# Patient Record
Sex: Male | Born: 1935 | Race: White | Hispanic: No | Marital: Married | Smoking: Former smoker
Health system: Southern US, Community
[De-identification: ages and names within clinical notes are randomized; demographics above are authoritative.]

## PROBLEM LIST (undated history)

## (undated) DIAGNOSIS — R000 Tachycardia, unspecified: Secondary | ICD-10-CM

## (undated) DIAGNOSIS — I43 Cardiomyopathy in diseases classified elsewhere: Secondary | ICD-10-CM

## (undated) DIAGNOSIS — T300 Burn of unspecified body region, unspecified degree: Secondary | ICD-10-CM

## (undated) DIAGNOSIS — N289 Disorder of kidney and ureter, unspecified: Secondary | ICD-10-CM

## (undated) DIAGNOSIS — I10 Essential (primary) hypertension: Secondary | ICD-10-CM

## (undated) DIAGNOSIS — E785 Hyperlipidemia, unspecified: Secondary | ICD-10-CM

## (undated) DIAGNOSIS — I509 Heart failure, unspecified: Secondary | ICD-10-CM

## (undated) DIAGNOSIS — I4891 Unspecified atrial fibrillation: Secondary | ICD-10-CM

## (undated) DIAGNOSIS — K449 Diaphragmatic hernia without obstruction or gangrene: Secondary | ICD-10-CM

## (undated) DIAGNOSIS — M1990 Unspecified osteoarthritis, unspecified site: Secondary | ICD-10-CM

## (undated) HISTORY — DX: Tachycardia, unspecified: I43

## (undated) HISTORY — DX: Essential (primary) hypertension: I10

## (undated) HISTORY — PX: BACK SURGERY: SHX140

## (undated) HISTORY — DX: Heart failure, unspecified: I50.9

## (undated) HISTORY — DX: Diaphragmatic hernia without obstruction or gangrene: K44.9

## (undated) HISTORY — DX: Hyperlipidemia, unspecified: E78.5

## (undated) HISTORY — DX: Burn of unspecified body region, unspecified degree: T30.0

## (undated) HISTORY — DX: Unspecified atrial fibrillation: I48.91

## (undated) HISTORY — DX: Tachycardia, unspecified: R00.0

---

## 2004-04-13 ENCOUNTER — Ambulatory Visit: Payer: Self-pay | Admitting: Family Medicine

## 2004-04-21 ENCOUNTER — Ambulatory Visit: Payer: Self-pay | Admitting: Family Medicine

## 2004-06-02 ENCOUNTER — Ambulatory Visit: Payer: Self-pay | Admitting: Family Medicine

## 2004-06-05 ENCOUNTER — Ambulatory Visit: Payer: Self-pay | Admitting: Family Medicine

## 2004-06-15 ENCOUNTER — Ambulatory Visit: Payer: Self-pay | Admitting: Family Medicine

## 2004-06-19 ENCOUNTER — Ambulatory Visit: Payer: Self-pay | Admitting: Family Medicine

## 2004-06-29 ENCOUNTER — Ambulatory Visit: Payer: Self-pay | Admitting: Family Medicine

## 2004-07-06 ENCOUNTER — Ambulatory Visit: Payer: Self-pay | Admitting: Family Medicine

## 2004-08-03 ENCOUNTER — Ambulatory Visit: Payer: Self-pay | Admitting: Family Medicine

## 2004-08-19 ENCOUNTER — Ambulatory Visit: Payer: Self-pay | Admitting: Family Medicine

## 2004-08-28 ENCOUNTER — Ambulatory Visit: Payer: Self-pay | Admitting: Family Medicine

## 2004-09-03 ENCOUNTER — Ambulatory Visit: Payer: Self-pay | Admitting: Family Medicine

## 2004-10-01 ENCOUNTER — Ambulatory Visit: Payer: Self-pay | Admitting: Family Medicine

## 2004-10-14 ENCOUNTER — Ambulatory Visit: Payer: Self-pay | Admitting: Family Medicine

## 2004-10-27 ENCOUNTER — Ambulatory Visit: Payer: Self-pay | Admitting: Family Medicine

## 2004-11-09 ENCOUNTER — Ambulatory Visit: Payer: Self-pay | Admitting: Family Medicine

## 2004-11-23 ENCOUNTER — Ambulatory Visit: Payer: Self-pay | Admitting: Family Medicine

## 2004-12-23 ENCOUNTER — Ambulatory Visit: Payer: Self-pay | Admitting: Family Medicine

## 2004-12-31 ENCOUNTER — Ambulatory Visit: Payer: Self-pay | Admitting: Family Medicine

## 2005-01-15 ENCOUNTER — Ambulatory Visit: Payer: Self-pay | Admitting: Family Medicine

## 2005-01-28 ENCOUNTER — Ambulatory Visit: Payer: Self-pay | Admitting: Family Medicine

## 2005-02-19 ENCOUNTER — Ambulatory Visit: Payer: Self-pay | Admitting: Family Medicine

## 2005-02-22 ENCOUNTER — Ambulatory Visit: Payer: Self-pay | Admitting: Family Medicine

## 2005-03-05 ENCOUNTER — Ambulatory Visit: Payer: Self-pay | Admitting: Family Medicine

## 2005-03-11 ENCOUNTER — Ambulatory Visit: Payer: Self-pay | Admitting: Family Medicine

## 2005-04-13 ENCOUNTER — Ambulatory Visit: Payer: Self-pay | Admitting: Family Medicine

## 2005-05-13 ENCOUNTER — Ambulatory Visit: Payer: Self-pay | Admitting: Family Medicine

## 2005-06-29 ENCOUNTER — Ambulatory Visit: Payer: Self-pay | Admitting: Family Medicine

## 2005-07-07 ENCOUNTER — Ambulatory Visit: Payer: Self-pay | Admitting: Family Medicine

## 2005-07-13 ENCOUNTER — Ambulatory Visit: Payer: Self-pay | Admitting: Family Medicine

## 2005-07-15 ENCOUNTER — Ambulatory Visit: Payer: Self-pay | Admitting: Family Medicine

## 2005-08-12 ENCOUNTER — Ambulatory Visit: Payer: Self-pay | Admitting: Family Medicine

## 2005-08-16 ENCOUNTER — Ambulatory Visit: Payer: Self-pay | Admitting: Family Medicine

## 2010-08-26 ENCOUNTER — Encounter: Payer: Self-pay | Admitting: Cardiovascular Disease

## 2010-08-31 ENCOUNTER — Encounter: Payer: Self-pay | Admitting: Cardiovascular Disease

## 2010-08-31 ENCOUNTER — Ambulatory Visit (INDEPENDENT_AMBULATORY_CARE_PROVIDER_SITE_OTHER): Payer: Medicare Other | Admitting: Cardiovascular Disease

## 2010-08-31 DIAGNOSIS — I10 Essential (primary) hypertension: Secondary | ICD-10-CM

## 2010-08-31 DIAGNOSIS — I739 Peripheral vascular disease, unspecified: Secondary | ICD-10-CM | POA: Insufficient documentation

## 2010-08-31 DIAGNOSIS — I5032 Chronic diastolic (congestive) heart failure: Secondary | ICD-10-CM | POA: Insufficient documentation

## 2010-08-31 DIAGNOSIS — I509 Heart failure, unspecified: Secondary | ICD-10-CM

## 2010-08-31 DIAGNOSIS — I4891 Unspecified atrial fibrillation: Secondary | ICD-10-CM

## 2010-08-31 NOTE — Assessment & Plan Note (Addendum)
His most recent EF was normal. He is well compensated at this time and seems to be in NYHA class 2. He is on good medical therapy.
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
## 2010-08-31 NOTE — Assessment & Plan Note (Signed)
BP is reasonable controlled. Will continue with current treatment.

## 2010-08-31 NOTE — Assessment & Plan Note (Signed)
He reports atypical leg discomfort. His pedal pulses are mildly reduced with prolonged history of diabetes. Will obtain an ABI.

## 2010-08-31 NOTE — Assessment & Plan Note (Addendum)
Chronic. Rate is controlled with Coreg and Digoxin which will be continued. Continue long term anticoagulation with goal INR of 2-3 which is being managed by Dr. Unk Lightning. He is currently taking 11 mg of Warfarin.  He is being scheduled for prostate biopsy. Given that he is on a large dose, I recommend holding Warfarin 1 week before planned procedure and checking PT/INR 1 day before. No need to bridge with Lovenox given that his short term thromboembolic risk is overall low. Warfarin can be resumed on day of or 1 day after the procedure.

## 2010-09-02 ENCOUNTER — Encounter: Payer: Self-pay | Admitting: Cardiovascular Disease

## 2010-09-03 ENCOUNTER — Encounter: Payer: Self-pay | Admitting: Cardiovascular Disease

## 2010-09-03 ENCOUNTER — Telehealth: Payer: Self-pay | Admitting: *Deleted

## 2010-09-03 NOTE — Telephone Encounter (Signed)
Called patient again after he called back to explain to him that ABI was normal.  Voiced understanding.

## 2010-09-03 NOTE — Telephone Encounter (Signed)
Opened

## 2010-11-26 ENCOUNTER — Ambulatory Visit (INDEPENDENT_AMBULATORY_CARE_PROVIDER_SITE_OTHER): Payer: Medicare Other | Admitting: Cardiovascular Disease

## 2010-11-26 ENCOUNTER — Encounter: Payer: Self-pay | Admitting: Cardiovascular Disease

## 2010-11-26 DIAGNOSIS — I10 Essential (primary) hypertension: Secondary | ICD-10-CM

## 2010-11-26 DIAGNOSIS — I4891 Unspecified atrial fibrillation: Secondary | ICD-10-CM

## 2010-11-26 DIAGNOSIS — I509 Heart failure, unspecified: Secondary | ICD-10-CM

## 2010-11-26 NOTE — Assessment & Plan Note (Signed)
He is stable and currently New York Heart Association class II. Most recent ejection fraction was normal. Continue medical therapy. There is no evidence of fluid overload.
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
## 2010-11-26 NOTE — Assessment & Plan Note (Signed)
His blood pressure is well controlled on current medications. 

## 2010-11-26 NOTE — Assessment & Plan Note (Signed)
His ventricular rate is well controlled with carvedilol and digoxin. Both will be continued. Continue long-term anticoagulation with warfarin with a goal I now between 2 and 3.

## 2011-03-02 ENCOUNTER — Ambulatory Visit
Admission: RE | Admit: 2011-03-02 | Discharge: 2011-03-02 | Disposition: A | Discharge status: Home / Self Care | Payer: Medicare Other | Source: Ambulatory Visit | Attending: Radiation Oncology | Admitting: Radiation Oncology

## 2011-03-02 DIAGNOSIS — C61 Malignant neoplasm of prostate: Secondary | ICD-10-CM | POA: Insufficient documentation

## 2011-03-02 DIAGNOSIS — Z7901 Long term (current) use of anticoagulants: Secondary | ICD-10-CM | POA: Insufficient documentation

## 2011-03-02 DIAGNOSIS — I4891 Unspecified atrial fibrillation: Secondary | ICD-10-CM | POA: Insufficient documentation

## 2011-03-02 DIAGNOSIS — E119 Type 2 diabetes mellitus without complications: Secondary | ICD-10-CM | POA: Insufficient documentation

## 2011-03-02 DIAGNOSIS — Z79899 Other long term (current) drug therapy: Secondary | ICD-10-CM | POA: Insufficient documentation

## 2011-03-02 DIAGNOSIS — E7800 Pure hypercholesterolemia, unspecified: Secondary | ICD-10-CM | POA: Insufficient documentation

## 2011-05-11 ENCOUNTER — Encounter: Payer: Self-pay | Admitting: Radiation Oncology

## 2011-05-11 ENCOUNTER — Ambulatory Visit
Admission: RE | Admit: 2011-05-11 | Discharge: 2011-05-11 | Disposition: A | Discharge status: Home / Self Care | Payer: Medicare Other | Source: Ambulatory Visit | Attending: Radiation Oncology | Admitting: Radiation Oncology

## 2011-05-11 VITALS — BP 119/78 | HR 71 | Temp 97.3°F | Resp 18 | Wt 281.8 lb

## 2011-05-11 DIAGNOSIS — C61 Malignant neoplasm of prostate: Secondary | ICD-10-CM

## 2011-05-11 NOTE — Progress Notes (Signed)
C/O HOT FLASHES FROM HORMONE INJECTIONS.  NORMAL URINATION

## 2011-05-11 NOTE — Progress Notes (Signed)
A followup note:  [name] returns today for review and scheduling of his radiation therapy. He was started on androgen deprivation therapy approximately 2 months ago. He is without new GU or GI difficulties. His I PSS score is 10. To review, he has a Gleason 8 (4+4) adenocarcinoma prostate with a presenting PSA of 7.4. When I saw him in consultation on 03/02/2011 he was interested in 5 weeks of external beam radiation therapy followed by prostate seed implant boost.  His physical examination not performed.  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 I again discussed his radiation therapy options. He and his daughter are more in favor of 8 weeks of external beam/IMRT rather than 5 weeks of external beam followed by prostate seed implant boost. He is also interested in having his treatment closer to home at [facility]. Therefore, I spoke with Dr. Alroy Williams, and she will see him for a followup visit at [place] and discuss prostate IMRT closer to home. His medical records will be forwarded to Dr. Alroy Williams. The patient can be contacted to his daughter, Dennis Williams, at [phone]. 30 minutes was spent face-to-face with the patient, primarily counseling the patient.

## 2013-01-24 ENCOUNTER — Other Ambulatory Visit: Payer: Self-pay | Admitting: *Deleted

## 2013-01-24 DIAGNOSIS — C61 Malignant neoplasm of prostate: Secondary | ICD-10-CM

## 2013-03-20 ENCOUNTER — Telehealth: Payer: Self-pay | Admitting: Cardiovascular Disease

## 2013-03-20 NOTE — Telephone Encounter (Signed)
Pt last seen 11/26/10 by Dr Fletcher Anon. I spoke with the pt's wife and the pt is scheduled for back surgery on 03/26/13 with Dr Leonarda Salon (Spine and  Scoliosis specialist).  This surgery has been scheduled for at least 2-3 weeks and the pt's wife said that Dr Unk Lightning, the pt's PCP, called today to make him aware that he needs cardiac clearance. I made her aware that I do not have any openings with Dr Fletcher Anon in the [place] or [place] office at this time. She then made me aware that the pt just saw Dr Lennox Pippins 6 months ago for a cardiac check up.  I instructed her to contact Dr Ervin Knack office to determine if they can do cardiac clearance since he is the pt's cardiologist at this time.  She agreed with plan.  The pt has been scheduled already for an appointment on 03/27/13 to discuss clearance.  At this time the wife would like to keep this appointment until she can determine if the pt can be seen this week by Dr Lennox Pippins.  I made her aware that she needs to contact our office to cancel appointment to avoid cancellation/no show fee.

## 2013-03-20 NOTE — Telephone Encounter (Signed)
New message    Pt has back surgery scheduled for Monday----Want someone to see him this week--I looked and could not find an appt for this wk--then the wife want the nurse to call her back.

## 2013-03-27 ENCOUNTER — Ambulatory Visit: Payer: Medicare Other | Admitting: Nurse Practitioner

## 2014-06-03 DIAGNOSIS — Z7901 Long term (current) use of anticoagulants: Secondary | ICD-10-CM | POA: Diagnosis not present

## 2014-06-07 DIAGNOSIS — Z09 Encounter for follow-up examination after completed treatment for conditions other than malignant neoplasm: Secondary | ICD-10-CM | POA: Diagnosis not present

## 2014-06-07 DIAGNOSIS — E1165 Type 2 diabetes mellitus with hyperglycemia: Secondary | ICD-10-CM | POA: Diagnosis not present

## 2014-06-07 DIAGNOSIS — H6121 Impacted cerumen, right ear: Secondary | ICD-10-CM | POA: Diagnosis not present

## 2014-06-07 DIAGNOSIS — Z23 Encounter for immunization: Secondary | ICD-10-CM | POA: Diagnosis not present

## 2014-06-12 DIAGNOSIS — M170 Bilateral primary osteoarthritis of knee: Secondary | ICD-10-CM | POA: Diagnosis not present

## 2014-06-18 DIAGNOSIS — Z7901 Long term (current) use of anticoagulants: Secondary | ICD-10-CM | POA: Diagnosis not present

## 2014-07-02 DIAGNOSIS — Z7901 Long term (current) use of anticoagulants: Secondary | ICD-10-CM | POA: Diagnosis not present

## 2014-07-31 DIAGNOSIS — M1711 Unilateral primary osteoarthritis, right knee: Secondary | ICD-10-CM | POA: Diagnosis not present

## 2014-07-31 DIAGNOSIS — M1712 Unilateral primary osteoarthritis, left knee: Secondary | ICD-10-CM | POA: Diagnosis not present

## 2014-08-06 DIAGNOSIS — Z7901 Long term (current) use of anticoagulants: Secondary | ICD-10-CM | POA: Diagnosis not present

## 2014-08-09 DIAGNOSIS — M1711 Unilateral primary osteoarthritis, right knee: Secondary | ICD-10-CM | POA: Diagnosis not present

## 2014-08-09 DIAGNOSIS — M1712 Unilateral primary osteoarthritis, left knee: Secondary | ICD-10-CM | POA: Diagnosis not present

## 2014-08-12 DIAGNOSIS — Z7901 Long term (current) use of anticoagulants: Secondary | ICD-10-CM | POA: Diagnosis not present

## 2014-08-16 DIAGNOSIS — M1711 Unilateral primary osteoarthritis, right knee: Secondary | ICD-10-CM | POA: Diagnosis not present

## 2014-08-16 DIAGNOSIS — M1712 Unilateral primary osteoarthritis, left knee: Secondary | ICD-10-CM | POA: Diagnosis not present

## 2014-08-19 DIAGNOSIS — Z7901 Long term (current) use of anticoagulants: Secondary | ICD-10-CM | POA: Diagnosis not present

## 2014-08-22 DIAGNOSIS — M1712 Unilateral primary osteoarthritis, left knee: Secondary | ICD-10-CM | POA: Diagnosis not present

## 2014-08-22 DIAGNOSIS — M1711 Unilateral primary osteoarthritis, right knee: Secondary | ICD-10-CM | POA: Diagnosis not present

## 2014-09-02 DIAGNOSIS — Z7901 Long term (current) use of anticoagulants: Secondary | ICD-10-CM | POA: Diagnosis not present

## 2014-09-09 DIAGNOSIS — N3289 Other specified disorders of bladder: Secondary | ICD-10-CM | POA: Diagnosis not present

## 2014-09-09 DIAGNOSIS — C61 Malignant neoplasm of prostate: Secondary | ICD-10-CM | POA: Diagnosis not present

## 2014-09-13 DIAGNOSIS — Z7901 Long term (current) use of anticoagulants: Secondary | ICD-10-CM | POA: Diagnosis not present

## 2014-10-14 DIAGNOSIS — Z7901 Long term (current) use of anticoagulants: Secondary | ICD-10-CM | POA: Diagnosis not present

## 2014-10-31 DIAGNOSIS — H3581 Retinal edema: Secondary | ICD-10-CM | POA: Diagnosis not present

## 2014-11-12 DIAGNOSIS — E1165 Type 2 diabetes mellitus with hyperglycemia: Secondary | ICD-10-CM | POA: Diagnosis not present

## 2014-11-12 DIAGNOSIS — Z79899 Other long term (current) drug therapy: Secondary | ICD-10-CM | POA: Diagnosis not present

## 2014-11-12 DIAGNOSIS — Z9181 History of falling: Secondary | ICD-10-CM | POA: Diagnosis not present

## 2014-11-12 DIAGNOSIS — Z794 Long term (current) use of insulin: Secondary | ICD-10-CM | POA: Diagnosis not present

## 2014-11-12 DIAGNOSIS — E782 Mixed hyperlipidemia: Secondary | ICD-10-CM | POA: Diagnosis not present

## 2014-11-12 DIAGNOSIS — I10 Essential (primary) hypertension: Secondary | ICD-10-CM | POA: Diagnosis not present

## 2014-11-13 DIAGNOSIS — E119 Type 2 diabetes mellitus without complications: Secondary | ICD-10-CM | POA: Diagnosis not present

## 2014-11-13 DIAGNOSIS — I509 Heart failure, unspecified: Secondary | ICD-10-CM | POA: Diagnosis not present

## 2014-11-13 DIAGNOSIS — E780 Pure hypercholesterolemia: Secondary | ICD-10-CM | POA: Diagnosis not present

## 2014-11-13 DIAGNOSIS — I10 Essential (primary) hypertension: Secondary | ICD-10-CM | POA: Diagnosis not present

## 2014-11-19 DIAGNOSIS — S91112A Laceration without foreign body of left great toe without damage to nail, initial encounter: Secondary | ICD-10-CM | POA: Diagnosis not present

## 2014-12-12 DIAGNOSIS — Z7901 Long term (current) use of anticoagulants: Secondary | ICD-10-CM | POA: Diagnosis not present

## 2014-12-27 DIAGNOSIS — Z7901 Long term (current) use of anticoagulants: Secondary | ICD-10-CM | POA: Diagnosis not present

## 2015-01-10 DIAGNOSIS — J0101 Acute recurrent maxillary sinusitis: Secondary | ICD-10-CM | POA: Diagnosis not present

## 2015-01-21 DIAGNOSIS — Z7901 Long term (current) use of anticoagulants: Secondary | ICD-10-CM | POA: Diagnosis not present

## 2015-02-24 DIAGNOSIS — Z7901 Long term (current) use of anticoagulants: Secondary | ICD-10-CM | POA: Diagnosis not present

## 2015-02-26 DIAGNOSIS — M1711 Unilateral primary osteoarthritis, right knee: Secondary | ICD-10-CM | POA: Diagnosis not present

## 2015-02-26 DIAGNOSIS — M1712 Unilateral primary osteoarthritis, left knee: Secondary | ICD-10-CM | POA: Diagnosis not present

## 2015-02-26 DIAGNOSIS — M170 Bilateral primary osteoarthritis of knee: Secondary | ICD-10-CM | POA: Diagnosis not present

## 2015-03-05 DIAGNOSIS — M1712 Unilateral primary osteoarthritis, left knee: Secondary | ICD-10-CM | POA: Diagnosis not present

## 2015-03-05 DIAGNOSIS — M170 Bilateral primary osteoarthritis of knee: Secondary | ICD-10-CM | POA: Diagnosis not present

## 2015-03-05 DIAGNOSIS — M1711 Unilateral primary osteoarthritis, right knee: Secondary | ICD-10-CM | POA: Diagnosis not present

## 2015-03-11 DIAGNOSIS — C61 Malignant neoplasm of prostate: Secondary | ICD-10-CM | POA: Diagnosis not present

## 2015-03-11 DIAGNOSIS — N3289 Other specified disorders of bladder: Secondary | ICD-10-CM | POA: Diagnosis not present

## 2015-03-11 DIAGNOSIS — Z7689 Persons encountering health services in other specified circumstances: Secondary | ICD-10-CM | POA: Diagnosis not present

## 2015-03-12 DIAGNOSIS — M170 Bilateral primary osteoarthritis of knee: Secondary | ICD-10-CM | POA: Diagnosis not present

## 2015-03-12 DIAGNOSIS — M1711 Unilateral primary osteoarthritis, right knee: Secondary | ICD-10-CM | POA: Diagnosis not present

## 2015-03-12 DIAGNOSIS — M1712 Unilateral primary osteoarthritis, left knee: Secondary | ICD-10-CM | POA: Diagnosis not present

## 2015-03-26 DIAGNOSIS — Z7901 Long term (current) use of anticoagulants: Secondary | ICD-10-CM | POA: Diagnosis not present

## 2015-04-04 DIAGNOSIS — Z794 Long term (current) use of insulin: Secondary | ICD-10-CM | POA: Diagnosis not present

## 2015-04-04 DIAGNOSIS — E1165 Type 2 diabetes mellitus with hyperglycemia: Secondary | ICD-10-CM | POA: Diagnosis not present

## 2015-04-04 DIAGNOSIS — I10 Essential (primary) hypertension: Secondary | ICD-10-CM | POA: Diagnosis not present

## 2015-04-04 DIAGNOSIS — E782 Mixed hyperlipidemia: Secondary | ICD-10-CM | POA: Diagnosis not present

## 2015-04-11 DIAGNOSIS — M961 Postlaminectomy syndrome, not elsewhere classified: Secondary | ICD-10-CM | POA: Diagnosis not present

## 2015-04-11 DIAGNOSIS — M4727 Other spondylosis with radiculopathy, lumbosacral region: Secondary | ICD-10-CM | POA: Diagnosis not present

## 2015-04-11 DIAGNOSIS — M545 Low back pain: Secondary | ICD-10-CM | POA: Diagnosis not present

## 2015-05-05 DIAGNOSIS — Z7901 Long term (current) use of anticoagulants: Secondary | ICD-10-CM | POA: Diagnosis not present

## 2015-05-06 DIAGNOSIS — H3563 Retinal hemorrhage, bilateral: Secondary | ICD-10-CM | POA: Diagnosis not present

## 2015-05-06 DIAGNOSIS — E113393 Type 2 diabetes mellitus with moderate nonproliferative diabetic retinopathy without macular edema, bilateral: Secondary | ICD-10-CM | POA: Diagnosis not present

## 2015-05-06 DIAGNOSIS — H2513 Age-related nuclear cataract, bilateral: Secondary | ICD-10-CM | POA: Diagnosis not present

## 2015-05-06 DIAGNOSIS — H5203 Hypermetropia, bilateral: Secondary | ICD-10-CM | POA: Diagnosis not present

## 2015-06-05 DIAGNOSIS — E785 Hyperlipidemia, unspecified: Secondary | ICD-10-CM | POA: Diagnosis not present

## 2015-06-05 DIAGNOSIS — I429 Cardiomyopathy, unspecified: Secondary | ICD-10-CM | POA: Insufficient documentation

## 2015-06-05 DIAGNOSIS — G4733 Obstructive sleep apnea (adult) (pediatric): Secondary | ICD-10-CM | POA: Diagnosis not present

## 2015-06-05 DIAGNOSIS — I4891 Unspecified atrial fibrillation: Secondary | ICD-10-CM | POA: Diagnosis not present

## 2015-06-05 DIAGNOSIS — E119 Type 2 diabetes mellitus without complications: Secondary | ICD-10-CM | POA: Diagnosis not present

## 2015-06-11 DIAGNOSIS — Z7901 Long term (current) use of anticoagulants: Secondary | ICD-10-CM | POA: Diagnosis not present

## 2015-06-25 DIAGNOSIS — I4891 Unspecified atrial fibrillation: Secondary | ICD-10-CM | POA: Diagnosis not present

## 2015-07-14 DIAGNOSIS — Z7901 Long term (current) use of anticoagulants: Secondary | ICD-10-CM | POA: Diagnosis not present

## 2015-08-11 DIAGNOSIS — Z7901 Long term (current) use of anticoagulants: Secondary | ICD-10-CM | POA: Diagnosis not present

## 2015-09-25 DIAGNOSIS — J209 Acute bronchitis, unspecified: Secondary | ICD-10-CM | POA: Diagnosis not present

## 2015-09-29 DIAGNOSIS — Z7689 Persons encountering health services in other specified circumstances: Secondary | ICD-10-CM | POA: Diagnosis not present

## 2015-09-29 DIAGNOSIS — C61 Malignant neoplasm of prostate: Secondary | ICD-10-CM | POA: Diagnosis not present

## 2015-09-29 DIAGNOSIS — N3289 Other specified disorders of bladder: Secondary | ICD-10-CM | POA: Diagnosis not present

## 2015-10-13 DIAGNOSIS — Z7901 Long term (current) use of anticoagulants: Secondary | ICD-10-CM | POA: Diagnosis not present

## 2015-11-10 DIAGNOSIS — Z7901 Long term (current) use of anticoagulants: Secondary | ICD-10-CM | POA: Diagnosis not present

## 2015-11-12 DIAGNOSIS — E113393 Type 2 diabetes mellitus with moderate nonproliferative diabetic retinopathy without macular edema, bilateral: Secondary | ICD-10-CM | POA: Diagnosis not present

## 2015-11-24 DIAGNOSIS — Z7901 Long term (current) use of anticoagulants: Secondary | ICD-10-CM | POA: Diagnosis not present

## 2015-12-09 DIAGNOSIS — Z7901 Long term (current) use of anticoagulants: Secondary | ICD-10-CM | POA: Diagnosis not present

## 2016-01-07 DIAGNOSIS — M170 Bilateral primary osteoarthritis of knee: Secondary | ICD-10-CM | POA: Diagnosis not present

## 2016-01-07 DIAGNOSIS — M1712 Unilateral primary osteoarthritis, left knee: Secondary | ICD-10-CM | POA: Diagnosis not present

## 2016-01-07 DIAGNOSIS — M1711 Unilateral primary osteoarthritis, right knee: Secondary | ICD-10-CM | POA: Diagnosis not present

## 2016-01-12 DIAGNOSIS — Z7901 Long term (current) use of anticoagulants: Secondary | ICD-10-CM | POA: Diagnosis not present

## 2016-01-14 DIAGNOSIS — M170 Bilateral primary osteoarthritis of knee: Secondary | ICD-10-CM | POA: Diagnosis not present

## 2016-01-14 DIAGNOSIS — M1711 Unilateral primary osteoarthritis, right knee: Secondary | ICD-10-CM | POA: Diagnosis not present

## 2016-01-14 DIAGNOSIS — M1712 Unilateral primary osteoarthritis, left knee: Secondary | ICD-10-CM | POA: Diagnosis not present

## 2016-01-23 DIAGNOSIS — M170 Bilateral primary osteoarthritis of knee: Secondary | ICD-10-CM | POA: Diagnosis not present

## 2016-01-23 DIAGNOSIS — M1712 Unilateral primary osteoarthritis, left knee: Secondary | ICD-10-CM | POA: Diagnosis not present

## 2016-01-23 DIAGNOSIS — M1711 Unilateral primary osteoarthritis, right knee: Secondary | ICD-10-CM | POA: Diagnosis not present

## 2016-01-27 DIAGNOSIS — N189 Chronic kidney disease, unspecified: Secondary | ICD-10-CM | POA: Diagnosis not present

## 2016-01-27 DIAGNOSIS — Z0000 Encounter for general adult medical examination without abnormal findings: Secondary | ICD-10-CM | POA: Diagnosis not present

## 2016-01-27 DIAGNOSIS — Z7901 Long term (current) use of anticoagulants: Secondary | ICD-10-CM | POA: Diagnosis not present

## 2016-01-27 DIAGNOSIS — I10 Essential (primary) hypertension: Secondary | ICD-10-CM | POA: Diagnosis not present

## 2016-01-27 DIAGNOSIS — E1165 Type 2 diabetes mellitus with hyperglycemia: Secondary | ICD-10-CM | POA: Diagnosis not present

## 2016-01-27 DIAGNOSIS — E782 Mixed hyperlipidemia: Secondary | ICD-10-CM | POA: Diagnosis not present

## 2016-02-27 DIAGNOSIS — Z7901 Long term (current) use of anticoagulants: Secondary | ICD-10-CM | POA: Diagnosis not present

## 2016-03-06 DIAGNOSIS — E1121 Type 2 diabetes mellitus with diabetic nephropathy: Secondary | ICD-10-CM | POA: Diagnosis not present

## 2016-03-13 DIAGNOSIS — I110 Hypertensive heart disease with heart failure: Secondary | ICD-10-CM | POA: Diagnosis not present

## 2016-03-13 DIAGNOSIS — I509 Heart failure, unspecified: Secondary | ICD-10-CM | POA: Diagnosis not present

## 2016-03-13 DIAGNOSIS — J9602 Acute respiratory failure with hypercapnia: Secondary | ICD-10-CM | POA: Diagnosis not present

## 2016-03-13 DIAGNOSIS — R0602 Shortness of breath: Secondary | ICD-10-CM | POA: Diagnosis not present

## 2016-03-14 DIAGNOSIS — Z9981 Dependence on supplemental oxygen: Secondary | ICD-10-CM | POA: Diagnosis not present

## 2016-03-14 DIAGNOSIS — J9610 Chronic respiratory failure, unspecified whether with hypoxia or hypercapnia: Secondary | ICD-10-CM | POA: Diagnosis not present

## 2016-03-14 DIAGNOSIS — N189 Chronic kidney disease, unspecified: Secondary | ICD-10-CM | POA: Diagnosis not present

## 2016-03-14 DIAGNOSIS — J9622 Acute and chronic respiratory failure with hypercapnia: Secondary | ICD-10-CM | POA: Diagnosis not present

## 2016-03-14 DIAGNOSIS — I130 Hypertensive heart and chronic kidney disease with heart failure and stage 1 through stage 4 chronic kidney disease, or unspecified chronic kidney disease: Secondary | ICD-10-CM | POA: Diagnosis not present

## 2016-03-14 DIAGNOSIS — G4733 Obstructive sleep apnea (adult) (pediatric): Secondary | ICD-10-CM | POA: Diagnosis not present

## 2016-03-14 DIAGNOSIS — J9602 Acute respiratory failure with hypercapnia: Secondary | ICD-10-CM | POA: Diagnosis not present

## 2016-03-14 DIAGNOSIS — I517 Cardiomegaly: Secondary | ICD-10-CM | POA: Diagnosis not present

## 2016-03-14 DIAGNOSIS — J441 Chronic obstructive pulmonary disease with (acute) exacerbation: Secondary | ICD-10-CM | POA: Diagnosis not present

## 2016-03-14 DIAGNOSIS — R0602 Shortness of breath: Secondary | ICD-10-CM | POA: Diagnosis not present

## 2016-03-14 DIAGNOSIS — M7989 Other specified soft tissue disorders: Secondary | ICD-10-CM | POA: Diagnosis not present

## 2016-03-14 DIAGNOSIS — M170 Bilateral primary osteoarthritis of knee: Secondary | ICD-10-CM | POA: Diagnosis not present

## 2016-03-14 DIAGNOSIS — I482 Chronic atrial fibrillation: Secondary | ICD-10-CM | POA: Diagnosis not present

## 2016-03-14 DIAGNOSIS — Z87891 Personal history of nicotine dependence: Secondary | ICD-10-CM | POA: Diagnosis not present

## 2016-03-14 DIAGNOSIS — N183 Chronic kidney disease, stage 3 (moderate): Secondary | ICD-10-CM | POA: Diagnosis not present

## 2016-03-14 DIAGNOSIS — I361 Nonrheumatic tricuspid (valve) insufficiency: Secondary | ICD-10-CM | POA: Diagnosis not present

## 2016-03-14 DIAGNOSIS — I5030 Unspecified diastolic (congestive) heart failure: Secondary | ICD-10-CM | POA: Diagnosis not present

## 2016-03-14 DIAGNOSIS — J9621 Acute and chronic respiratory failure with hypoxia: Secondary | ICD-10-CM | POA: Diagnosis not present

## 2016-03-14 DIAGNOSIS — I110 Hypertensive heart disease with heart failure: Secondary | ICD-10-CM | POA: Diagnosis not present

## 2016-03-14 DIAGNOSIS — I509 Heart failure, unspecified: Secondary | ICD-10-CM | POA: Diagnosis not present

## 2016-03-14 DIAGNOSIS — J9692 Respiratory failure, unspecified with hypercapnia: Secondary | ICD-10-CM | POA: Diagnosis not present

## 2016-03-14 DIAGNOSIS — I5033 Acute on chronic diastolic (congestive) heart failure: Secondary | ICD-10-CM | POA: Diagnosis not present

## 2016-03-14 DIAGNOSIS — E1122 Type 2 diabetes mellitus with diabetic chronic kidney disease: Secondary | ICD-10-CM | POA: Diagnosis not present

## 2016-03-14 DIAGNOSIS — Z7901 Long term (current) use of anticoagulants: Secondary | ICD-10-CM | POA: Diagnosis not present

## 2016-03-14 DIAGNOSIS — Z79899 Other long term (current) drug therapy: Secondary | ICD-10-CM | POA: Diagnosis not present

## 2016-03-14 DIAGNOSIS — Z794 Long term (current) use of insulin: Secondary | ICD-10-CM | POA: Diagnosis not present

## 2016-03-17 DIAGNOSIS — I509 Heart failure, unspecified: Secondary | ICD-10-CM

## 2016-03-17 DIAGNOSIS — N189 Chronic kidney disease, unspecified: Secondary | ICD-10-CM | POA: Diagnosis not present

## 2016-03-17 DIAGNOSIS — J9692 Respiratory failure, unspecified with hypercapnia: Secondary | ICD-10-CM

## 2016-03-17 DIAGNOSIS — I482 Chronic atrial fibrillation: Secondary | ICD-10-CM | POA: Diagnosis not present

## 2016-03-26 DIAGNOSIS — N189 Chronic kidney disease, unspecified: Secondary | ICD-10-CM | POA: Diagnosis not present

## 2016-03-26 DIAGNOSIS — J9612 Chronic respiratory failure with hypercapnia: Secondary | ICD-10-CM | POA: Diagnosis not present

## 2016-03-26 DIAGNOSIS — I509 Heart failure, unspecified: Secondary | ICD-10-CM | POA: Diagnosis not present

## 2016-03-26 DIAGNOSIS — Z7901 Long term (current) use of anticoagulants: Secondary | ICD-10-CM | POA: Diagnosis not present

## 2016-03-26 DIAGNOSIS — E1165 Type 2 diabetes mellitus with hyperglycemia: Secondary | ICD-10-CM | POA: Diagnosis not present

## 2016-03-26 DIAGNOSIS — I4891 Unspecified atrial fibrillation: Secondary | ICD-10-CM | POA: Diagnosis not present

## 2016-03-31 DIAGNOSIS — C61 Malignant neoplasm of prostate: Secondary | ICD-10-CM | POA: Diagnosis not present

## 2016-03-31 DIAGNOSIS — Z7901 Long term (current) use of anticoagulants: Secondary | ICD-10-CM | POA: Diagnosis not present

## 2016-04-05 ENCOUNTER — Other Ambulatory Visit: Payer: Self-pay

## 2016-04-05 DIAGNOSIS — G4710 Hypersomnia, unspecified: Secondary | ICD-10-CM

## 2016-04-05 DIAGNOSIS — R0683 Snoring: Secondary | ICD-10-CM

## 2016-04-08 DIAGNOSIS — M25561 Pain in right knee: Secondary | ICD-10-CM | POA: Diagnosis not present

## 2016-04-08 DIAGNOSIS — M170 Bilateral primary osteoarthritis of knee: Secondary | ICD-10-CM | POA: Insufficient documentation

## 2016-04-08 DIAGNOSIS — M25562 Pain in left knee: Secondary | ICD-10-CM | POA: Diagnosis not present

## 2016-04-08 DIAGNOSIS — M1711 Unilateral primary osteoarthritis, right knee: Secondary | ICD-10-CM | POA: Diagnosis not present

## 2016-04-08 DIAGNOSIS — M1712 Unilateral primary osteoarthritis, left knee: Secondary | ICD-10-CM | POA: Diagnosis not present

## 2016-04-11 ENCOUNTER — Ambulatory Visit (HOSPITAL_BASED_OUTPATIENT_CLINIC_OR_DEPARTMENT_OTHER): Payer: Medicare Other | Attending: Family Medicine | Admitting: Internal Medicine

## 2016-04-11 VITALS — Ht 68.0 in | Wt 293.0 lb

## 2016-04-11 DIAGNOSIS — G4700 Insomnia, unspecified: Secondary | ICD-10-CM | POA: Insufficient documentation

## 2016-04-11 DIAGNOSIS — G4710 Hypersomnia, unspecified: Secondary | ICD-10-CM

## 2016-04-11 DIAGNOSIS — R0683 Snoring: Secondary | ICD-10-CM | POA: Insufficient documentation

## 2016-04-13 DIAGNOSIS — I420 Dilated cardiomyopathy: Secondary | ICD-10-CM | POA: Diagnosis not present

## 2016-04-13 DIAGNOSIS — I482 Chronic atrial fibrillation: Secondary | ICD-10-CM | POA: Diagnosis not present

## 2016-04-13 DIAGNOSIS — E785 Hyperlipidemia, unspecified: Secondary | ICD-10-CM | POA: Diagnosis not present

## 2016-04-13 DIAGNOSIS — G4733 Obstructive sleep apnea (adult) (pediatric): Secondary | ICD-10-CM | POA: Diagnosis not present

## 2016-04-13 DIAGNOSIS — E119 Type 2 diabetes mellitus without complications: Secondary | ICD-10-CM | POA: Diagnosis not present

## 2016-04-17 DIAGNOSIS — R0683 Snoring: Secondary | ICD-10-CM

## 2016-04-17 NOTE — Procedures (Signed)
Patient Name: Dennis Williams, Dennis Williams Date: 04/11/2016 Gender: Male D.O.B: April 10, 1934 Age (years): 41 Referring Provider: Janace Litten MD Height (inches): 66 Interpreting Physician: Baird Lyons MD, ABSM Weight (lbs): 295 RPSGT: Madelon Lips BMI: 48 MRN: [id number] Neck Size: 18.00 CLINICAL INFORMATION The patient is referred for a split night study with BPAP.  MEDICATIONS Medications self-administered by patient taken the night of the study : [place] As per the AASM Manual for the Scoring of Sleep and Associated Events v2.3 (April 2016) with a hypopnea requiring 4% desaturations. The channels recorded and monitored were frontal, central and occipital EEG, electrooculogram (EOG), submentalis EMG (chin), nasal and oral airflow, thoracic and abdominal wall motion, anterior tibialis EMG, snore microphone, electrocardiogram, and pulse oximetry. Bi-level positive airway pressure (BiPAP) was initiated when the patient met split night criteria and was titrated according to treat sleep-disordered breathing.  RESPIRATORY PARAMETERS Diagnostic Total AHI (/hr): 33.2 RDI (/hr): 36.1 OA Index (/hr): 21.8 CA Index (/hr): 0.0 REM AHI (/hr): 60.0 NREM AHI (/hr): 33.0 Supine AHI (/hr): 33.2 Non-supine AHI (/hr): N/A Min O2 Sat (%): 70.00 Mean O2 (%): 94.20 Time below 88% (min): 3.9   Titration Optimal IPAP Pressure (cm): 22 Optimal EPAP Pressure (cm): 18 AHI at Optimal Pressure (/hr): 17.0 Min O2 at Optimal Pressure (%): 82.0 Sleep % at Optimal (%): 92 Supine % at Optimal (%): 100      SLEEP ARCHITECTURE The study was initiated at 11:06:28 PM and terminated at 5:27:54 AM. The total recorded time was 381.4 minutes. EEG confirmed total sleep time was 301.0 minutes yielding a sleep efficiency of 78.9%. Sleep onset after lights out was 6.3 minutes with a REM latency of 64.5 minutes. The patient spent 23.09% of the night in stage N1 sleep, 53.82% in stage N2 sleep, 0.00% in  stage N3 and 23.09% in REM. Wake after sleep onset (WASO) was 74.2 minutes. The Arousal Index was 14.0/hour.  LEG MOVEMENT DATA The total Periodic Limb Movements of Sleep (PLMS) were 0. The PLMS index was 0.00 .  CARDIAC DATA The 2 lead EKG demonstrated sinus rhythm and paroxysmal atrial fibrillation. The mean heart rate was 62.02 beats per minute. Other EKG findings include: PVCs.  IMPRESSIONS - Severe obstructive sleep apnea occurred during the diagnostic portion of the study (AHI = 33.2 /hour). An optimal PAP pressure was selected for this patient ( 22 / 18 cm of water) - CPAP provided insufficient control and was changed to BiPAP per protocol. - No significant central sleep apnea occurred during the diagnostic portion of the study (CAI = 0.0/hour). - Severe oxygen desaturation was noted during the diagnostic portion of the study (Min O2 = 70.00%).  O2 added and maintained at 2L. - The patient snored with Loud snoring volume during the diagnostic portion of the study. - EKG findings include atrial fibrillation, PVCs. - Clinically significant periodic limb movements of sleep did not occur during the study.  DIAGNOSIS - Obstructive Sleep Apnea (327.23 [G47.33 ICD-10])  RECOMMENDATIONS - Trial of BiPAP therapy on 22/18 cm H2O with a Medium size Resmed Full Face Mask AirFit F10 mask and heated humidification. - Continue supplemental O2 at 2L/ min. - Avoid alcohol, sedatives and other CNS depressants that may worsen sleep apnea and disrupt normal sleep architecture. - Sleep hygiene should be reviewed to assess factors that may improve sleep quality. - Weight management and regular exercise should be initiated or continued  [Electronically signed] 04/17/2016 11:19 AM  Baird Lyons MD, [place], American Board  of Sleep Medicine   NPI: [id number] [place], American Board of Sleep Medicine  ELECTRONICALLY SIGNED ON:  04/17/2016, 11:11 AM [place] PH: [phone]   FX: [phone] [place]

## 2016-04-19 DIAGNOSIS — I509 Heart failure, unspecified: Secondary | ICD-10-CM | POA: Diagnosis not present

## 2016-04-19 DIAGNOSIS — Z7901 Long term (current) use of anticoagulants: Secondary | ICD-10-CM | POA: Diagnosis not present

## 2016-04-28 DIAGNOSIS — I429 Cardiomyopathy, unspecified: Secondary | ICD-10-CM | POA: Diagnosis not present

## 2016-05-03 DIAGNOSIS — Z7901 Long term (current) use of anticoagulants: Secondary | ICD-10-CM | POA: Diagnosis not present

## 2016-05-13 DIAGNOSIS — I420 Dilated cardiomyopathy: Secondary | ICD-10-CM | POA: Diagnosis not present

## 2016-05-13 DIAGNOSIS — I482 Chronic atrial fibrillation: Secondary | ICD-10-CM | POA: Diagnosis not present

## 2016-05-13 DIAGNOSIS — E785 Hyperlipidemia, unspecified: Secondary | ICD-10-CM | POA: Diagnosis not present

## 2016-05-13 DIAGNOSIS — G4733 Obstructive sleep apnea (adult) (pediatric): Secondary | ICD-10-CM | POA: Diagnosis not present

## 2016-05-13 DIAGNOSIS — Z794 Long term (current) use of insulin: Secondary | ICD-10-CM | POA: Diagnosis not present

## 2016-05-13 DIAGNOSIS — E119 Type 2 diabetes mellitus without complications: Secondary | ICD-10-CM | POA: Diagnosis not present

## 2016-05-18 DIAGNOSIS — Z7901 Long term (current) use of anticoagulants: Secondary | ICD-10-CM | POA: Diagnosis not present

## 2016-05-19 DIAGNOSIS — I509 Heart failure, unspecified: Secondary | ICD-10-CM | POA: Diagnosis not present

## 2016-05-21 DIAGNOSIS — G4733 Obstructive sleep apnea (adult) (pediatric): Secondary | ICD-10-CM | POA: Diagnosis not present

## 2016-06-03 ENCOUNTER — Telehealth: Payer: Self-pay | Admitting: Internal Medicine

## 2016-06-03 NOTE — Telephone Encounter (Signed)
Spoke with pt, states he is having issues with his cpap machine. I advised pt that we've never seen him in clinic and that he needs to follow up with the doctor who ordered his sleep study.    I explained this for over 23 minutes to the patient, who did not understand why I couldn't help him with this situation. I then spoke with his wife and explained this, who expressed understanding. Nothing further needed.

## 2016-06-18 DIAGNOSIS — Z7901 Long term (current) use of anticoagulants: Secondary | ICD-10-CM | POA: Diagnosis not present

## 2016-06-19 DIAGNOSIS — I509 Heart failure, unspecified: Secondary | ICD-10-CM | POA: Diagnosis not present

## 2016-06-21 DIAGNOSIS — G4733 Obstructive sleep apnea (adult) (pediatric): Secondary | ICD-10-CM | POA: Diagnosis not present

## 2016-06-24 DIAGNOSIS — H2513 Age-related nuclear cataract, bilateral: Secondary | ICD-10-CM | POA: Diagnosis not present

## 2016-06-24 DIAGNOSIS — H524 Presbyopia: Secondary | ICD-10-CM | POA: Diagnosis not present

## 2016-06-24 DIAGNOSIS — E113393 Type 2 diabetes mellitus with moderate nonproliferative diabetic retinopathy without macular edema, bilateral: Secondary | ICD-10-CM | POA: Diagnosis not present

## 2016-07-01 DIAGNOSIS — G4730 Sleep apnea, unspecified: Secondary | ICD-10-CM | POA: Diagnosis not present

## 2016-07-01 DIAGNOSIS — G8929 Other chronic pain: Secondary | ICD-10-CM | POA: Diagnosis not present

## 2016-07-01 DIAGNOSIS — M25562 Pain in left knee: Secondary | ICD-10-CM | POA: Diagnosis not present

## 2016-07-20 DIAGNOSIS — E119 Type 2 diabetes mellitus without complications: Secondary | ICD-10-CM | POA: Diagnosis not present

## 2016-07-20 DIAGNOSIS — Z7901 Long term (current) use of anticoagulants: Secondary | ICD-10-CM | POA: Diagnosis not present

## 2016-07-20 DIAGNOSIS — I420 Dilated cardiomyopathy: Secondary | ICD-10-CM | POA: Diagnosis not present

## 2016-07-20 DIAGNOSIS — G4733 Obstructive sleep apnea (adult) (pediatric): Secondary | ICD-10-CM | POA: Diagnosis not present

## 2016-07-20 DIAGNOSIS — I482 Chronic atrial fibrillation: Secondary | ICD-10-CM | POA: Diagnosis not present

## 2016-07-20 DIAGNOSIS — I509 Heart failure, unspecified: Secondary | ICD-10-CM | POA: Diagnosis not present

## 2016-07-20 DIAGNOSIS — E785 Hyperlipidemia, unspecified: Secondary | ICD-10-CM | POA: Diagnosis not present

## 2016-07-22 DIAGNOSIS — G4733 Obstructive sleep apnea (adult) (pediatric): Secondary | ICD-10-CM | POA: Diagnosis not present

## 2016-07-27 DIAGNOSIS — Z7901 Long term (current) use of anticoagulants: Secondary | ICD-10-CM | POA: Diagnosis not present

## 2016-07-29 DIAGNOSIS — I420 Dilated cardiomyopathy: Secondary | ICD-10-CM | POA: Diagnosis not present

## 2016-07-29 DIAGNOSIS — E119 Type 2 diabetes mellitus without complications: Secondary | ICD-10-CM | POA: Diagnosis not present

## 2016-07-29 DIAGNOSIS — E785 Hyperlipidemia, unspecified: Secondary | ICD-10-CM | POA: Diagnosis not present

## 2016-07-29 DIAGNOSIS — I482 Chronic atrial fibrillation: Secondary | ICD-10-CM | POA: Diagnosis not present

## 2016-07-29 DIAGNOSIS — G4733 Obstructive sleep apnea (adult) (pediatric): Secondary | ICD-10-CM | POA: Diagnosis not present

## 2016-07-30 DIAGNOSIS — Z7901 Long term (current) use of anticoagulants: Secondary | ICD-10-CM | POA: Diagnosis not present

## 2016-08-13 DIAGNOSIS — Z7901 Long term (current) use of anticoagulants: Secondary | ICD-10-CM | POA: Diagnosis not present

## 2016-08-17 DIAGNOSIS — I509 Heart failure, unspecified: Secondary | ICD-10-CM | POA: Diagnosis not present

## 2016-08-19 DIAGNOSIS — G4733 Obstructive sleep apnea (adult) (pediatric): Secondary | ICD-10-CM | POA: Diagnosis not present

## 2016-08-20 DIAGNOSIS — Z7901 Long term (current) use of anticoagulants: Secondary | ICD-10-CM | POA: Diagnosis not present

## 2016-08-31 DIAGNOSIS — Z01818 Encounter for other preprocedural examination: Secondary | ICD-10-CM | POA: Diagnosis not present

## 2016-08-31 DIAGNOSIS — M1712 Unilateral primary osteoarthritis, left knee: Secondary | ICD-10-CM | POA: Diagnosis not present

## 2016-08-31 DIAGNOSIS — E1165 Type 2 diabetes mellitus with hyperglycemia: Secondary | ICD-10-CM | POA: Diagnosis not present

## 2016-08-31 DIAGNOSIS — Z794 Long term (current) use of insulin: Secondary | ICD-10-CM | POA: Diagnosis not present

## 2016-09-16 DIAGNOSIS — G8929 Other chronic pain: Secondary | ICD-10-CM | POA: Diagnosis not present

## 2016-09-16 DIAGNOSIS — M1712 Unilateral primary osteoarthritis, left knee: Secondary | ICD-10-CM | POA: Diagnosis not present

## 2016-09-16 DIAGNOSIS — M25561 Pain in right knee: Secondary | ICD-10-CM | POA: Diagnosis not present

## 2016-09-17 DIAGNOSIS — I509 Heart failure, unspecified: Secondary | ICD-10-CM | POA: Diagnosis not present

## 2016-09-19 DIAGNOSIS — G4733 Obstructive sleep apnea (adult) (pediatric): Secondary | ICD-10-CM | POA: Diagnosis not present

## 2016-09-23 DIAGNOSIS — I420 Dilated cardiomyopathy: Secondary | ICD-10-CM | POA: Diagnosis not present

## 2016-09-23 DIAGNOSIS — E119 Type 2 diabetes mellitus without complications: Secondary | ICD-10-CM | POA: Diagnosis not present

## 2016-09-23 DIAGNOSIS — I482 Chronic atrial fibrillation: Secondary | ICD-10-CM | POA: Diagnosis not present

## 2016-09-23 DIAGNOSIS — G4733 Obstructive sleep apnea (adult) (pediatric): Secondary | ICD-10-CM | POA: Diagnosis not present

## 2016-09-23 DIAGNOSIS — E785 Hyperlipidemia, unspecified: Secondary | ICD-10-CM | POA: Diagnosis not present

## 2016-09-28 DIAGNOSIS — Z7901 Long term (current) use of anticoagulants: Secondary | ICD-10-CM | POA: Diagnosis not present

## 2016-09-28 DIAGNOSIS — C61 Malignant neoplasm of prostate: Secondary | ICD-10-CM | POA: Diagnosis not present

## 2016-10-07 ENCOUNTER — Other Ambulatory Visit: Payer: Self-pay | Admitting: Orthopedic Surgery

## 2016-10-17 DIAGNOSIS — I509 Heart failure, unspecified: Secondary | ICD-10-CM | POA: Diagnosis not present

## 2016-10-19 DIAGNOSIS — G4733 Obstructive sleep apnea (adult) (pediatric): Secondary | ICD-10-CM | POA: Diagnosis not present

## 2016-10-20 DIAGNOSIS — G4733 Obstructive sleep apnea (adult) (pediatric): Secondary | ICD-10-CM | POA: Diagnosis not present

## 2016-10-21 DIAGNOSIS — E1165 Type 2 diabetes mellitus with hyperglycemia: Secondary | ICD-10-CM | POA: Diagnosis not present

## 2016-10-21 DIAGNOSIS — E782 Mixed hyperlipidemia: Secondary | ICD-10-CM | POA: Diagnosis not present

## 2016-10-21 DIAGNOSIS — M25562 Pain in left knee: Secondary | ICD-10-CM | POA: Diagnosis not present

## 2016-10-21 DIAGNOSIS — I10 Essential (primary) hypertension: Secondary | ICD-10-CM | POA: Diagnosis not present

## 2016-10-21 DIAGNOSIS — I482 Chronic atrial fibrillation: Secondary | ICD-10-CM | POA: Diagnosis not present

## 2016-11-04 ENCOUNTER — Encounter (HOSPITAL_COMMUNITY): Payer: Self-pay

## 2016-11-04 ENCOUNTER — Encounter (HOSPITAL_COMMUNITY)
Admission: RE | Admit: 2016-11-04 | Discharge: 2016-11-04 | Disposition: A | Discharge status: Home / Self Care | Payer: Medicare Other | Source: Ambulatory Visit | Attending: Orthopedic Surgery | Admitting: Orthopedic Surgery

## 2016-11-04 DIAGNOSIS — E785 Hyperlipidemia, unspecified: Secondary | ICD-10-CM | POA: Insufficient documentation

## 2016-11-04 DIAGNOSIS — E119 Type 2 diabetes mellitus without complications: Secondary | ICD-10-CM | POA: Insufficient documentation

## 2016-11-04 DIAGNOSIS — Z01812 Encounter for preprocedural laboratory examination: Secondary | ICD-10-CM | POA: Diagnosis not present

## 2016-11-04 DIAGNOSIS — I509 Heart failure, unspecified: Secondary | ICD-10-CM | POA: Insufficient documentation

## 2016-11-04 DIAGNOSIS — M1712 Unilateral primary osteoarthritis, left knee: Secondary | ICD-10-CM | POA: Insufficient documentation

## 2016-11-04 DIAGNOSIS — I119 Hypertensive heart disease without heart failure: Secondary | ICD-10-CM | POA: Insufficient documentation

## 2016-11-04 HISTORY — DX: Disorder of kidney and ureter, unspecified: N28.9

## 2016-11-04 LAB — SURGICAL PCR SCREEN
MRSA, PCR: NEGATIVE
Staphylococcus aureus: NEGATIVE

## 2016-11-04 LAB — CBC WITH DIFFERENTIAL/PLATELET
Basophils Absolute: 0 10*3/uL (ref 0.0–0.1)
Basophils Relative: 0 %
Eosinophils Absolute: 0.2 10*3/uL (ref 0.0–0.7)
Eosinophils Relative: 3 %
HCT: 45.7 % (ref 39.0–52.0)
Hemoglobin: 14.6 g/dL (ref 13.0–17.0)
Lymphocytes Relative: 39 %
Lymphs Abs: 3.3 10*3/uL (ref 0.7–4.0)
MCH: 30.1 pg (ref 26.0–34.0)
MCHC: 31.9 g/dL (ref 30.0–36.0)
MCV: 94.2 fL (ref 78.0–100.0)
Monocytes Absolute: 0.5 10*3/uL (ref 0.1–1.0)
Monocytes Relative: 6 %
Neutro Abs: 4.4 10*3/uL (ref 1.7–7.7)
Neutrophils Relative %: 52 %
Platelets: 162 10*3/uL (ref 150–400)
RBC: 4.85 MIL/uL (ref 4.22–5.81)
RDW: 13.7 % (ref 11.5–15.5)
WBC: 8.5 10*3/uL (ref 4.0–10.5)

## 2016-11-04 LAB — COMPREHENSIVE METABOLIC PANEL
ALT: 18 U/L (ref 17–63)
AST: 22 U/L (ref 15–41)
Albumin: 3.7 g/dL (ref 3.5–5.0)
Alkaline Phosphatase: 72 U/L (ref 38–126)
Anion gap: 9 (ref 5–15)
BUN: 39 mg/dL — ABNORMAL HIGH (ref 6–20)
CO2: 28 mmol/L (ref 22–32)
Calcium: 9.7 mg/dL (ref 8.9–10.3)
Chloride: 104 mmol/L (ref 101–111)
Creatinine, Ser: 2 mg/dL — ABNORMAL HIGH (ref 0.61–1.24)
GFR calc Af Amer: 35 mL/min — ABNORMAL LOW (ref >60)
GFR calc non Af Amer: 30 mL/min — ABNORMAL LOW (ref >60)
Glucose, Bld: 110 mg/dL — ABNORMAL HIGH (ref 65–99)
Potassium: 3.9 mmol/L (ref 3.5–5.1)
Sodium: 141 mmol/L (ref 135–145)
Total Bilirubin: 0.7 mg/dL (ref 0.3–1.2)
Total Protein: 7.1 g/dL (ref 6.5–8.1)

## 2016-11-04 LAB — GLUCOSE, CAPILLARY: Glucose-Capillary: 105 mg/dL — ABNORMAL HIGH (ref 65–99)

## 2016-11-04 NOTE — Progress Notes (Signed)
Pt and daughter reports PCP will be seen 11/08/16 for instructions on coumadin, will see surgeon 11/05/16.  PT/INR & consent DOS.

## 2016-11-04 NOTE — Pre-Procedure Instructions (Signed)
Dennis Williams  11/04/2016      [facility] - [place], [place] - [place] [address] Phone: [phone] Fax: [phone]    Your procedure is scheduled on June 18.  Report to [facility] Admitting at 6:15 A.M.  Call this number if you have problems the morning of surgery:  [phone]   Remember:  Do not eat food or drink liquids after midnight.  Take these medicines the morning of surgery with A SIP OF WATER : carvedilol (COREG), digoxin (LANOXIN),              potassium chloride (K-DUR)   warfarin (COUMADIN) as directed      How to Manage Your Diabetes Before and After Surgery  Why is it important to control my blood sugar before and after surgery? . Improving blood sugar levels before and after surgery helps healing and can limit problems. . A way of improving blood sugar control is eating a healthy diet by: o  Eating less sugar and carbohydrates o  Increasing activity/exercise o  Talking with your doctor about reaching your blood sugar goals . High blood sugars (greater than 180 mg/dL) can raise your risk of infections and slow your recovery, so you will need to focus on controlling your diabetes during the weeks before surgery. . Make sure that the doctor who takes care of your diabetes knows about your planned surgery including the date and location.  How do I manage my blood sugar before surgery? . Check your blood sugar at least 4 times a day, starting 2 days before surgery, to make sure that the level is not too high or low. o Check your blood sugar the morning of your surgery when you wake up and every 2 hours until you get to the Short Stay unit. . If your blood sugar is less than 70 mg/dL, you will need to treat for low blood sugar: o Do not take insulin. o Treat a low blood sugar (less than 70 mg/dL) with  cup of clear juice (cranberry or apple), 4 glucose tablets, OR glucose gel. o Recheck blood  sugar in 15 minutes after treatment (to make sure it is greater than 70 mg/dL). If your blood sugar is not greater than 70 mg/dL on recheck, call [phone] for further instructions. . Report your blood sugar to the short stay nurse when you get to Short Stay.  . If you are admitted to the hospital after surgery: o Your blood sugar will be checked by the staff and you will probably be given insulin after surgery (instead of oral diabetes medicines) to make sure you have good blood sugar levels. o The goal for blood sugar control after surgery is 80-180 mg/dL.     WHAT DO I DO ABOUT MY DIABETES MEDICATION?   Marland Kitchen Do not take oral diabetes medicines (pills) the morning of surgery.  . THE NIGHT BEFORE SURGERY, take ___37________ units of ___lantus____insulin.       Marland Kitchen HE MORNING OF SURGERY take none of humalog insulin.  . The day of surgery, do not take other diabetes injectables, including Byetta (exenatide), Bydureon (exenatide ER), Victoza (liraglutide), or Trulicity (dulaglutide).  . If your CBG is greater than 220 mg/dL, you may take  of your sliding scale (correction) dose of insulin.  Other Instructions:          Patient Signature:  Date:   Nurse Signature:  Date:  Reviewed and Endorsed by [facility] Patient Education Committee, August 2015   Do not wear jewelry, make-up or nail polish.  Do not wear lotions, powders, or perfumes, or deoderant.  Do not shave 48 hours prior to surgery.  Men may shave face and neck.  Do not bring valuables to the hospital.  [facility] is not responsible for any belongings or valuables.  Contacts, dentures or bridgework may not be worn into surgery.  Leave your suitcase in the car.  After surgery it may be brought to your room.  For patients admitted to the hospital, discharge time will be determined by your treatment team.  Patients discharged the day of surgery will not be allowed to drive home.   Name and phone number of your  driver:    Special instructions:  Preparing for surgery  Please read over the following fact sheets that you were given. Pain Booklet and Surgical Site Infection Prevention

## 2016-11-05 NOTE — Progress Notes (Addendum)
Anesthesia Chart Review:  Pt is an 83 year old male scheduled for L total knee arthroplasty on 11/15/2016 with Vickey Huger, M.D.  - Janace Litten, MD (notes in care everywhere)  - Cardiologist is Jenne Campus, MD who cleared pt for surgery (notes in care everywhere)   PMH includes: Tachycardia induced cardiomyopathy, permanent atrial fibrillation, CHF, HTN, DM, hyperlipidemia, renal insufficiency. Former smoker. BMI 42.5  Medications include: Lipitor, captopril-HCTZ, carvedilol, digoxin, Lasix, Lantus, Humalog, potassium, Coumadin.  Preoperative labs 11/04/16 reviewed.   - Cr 2.0, BUN 39. Baseline Cr appears to be around 1.8 - I left message for Dr. Unk Lightning about worsening renal function.   EKG will be obtained DOS.   Nuclear stress test 09/23/16:  1. Cardiolite images do not reveal any evidence of ischemia. 2. Preserved systolic function. Cannulated EF 55%.  Echo 07/29/16:  1. LV cavity normal in size. Calculated EF 60%. 2. LA cavity mildly dilated. 3. Trileaflet aortic valve with trace regurgitation. Mild aortic valve leaflet thickening with sclerosis. 4. Structurally normal mitral valve with trace regurgitation. 5. Structurally normal tricuspid valve with mild regurgitation. 6. IVC is dilated with respiratory variation.   Pt saw Dr. Unk Lightning 11/08/16.  Coumadin was stopped 11/09/16, lovenox bridge started 11/10/16.  Labs rechecked, Cr now down to 1.76 which is more in keeping with his baseline.   If no changes, I anticipate pt can proceed with surgery as scheduled.   Willeen Cass, FNP-BC [facility]/Anesthesiology Phone: [phone] 11/10/2016 11:45 AM

## 2016-11-08 DIAGNOSIS — M1710 Unilateral primary osteoarthritis, unspecified knee: Secondary | ICD-10-CM | POA: Diagnosis not present

## 2016-11-08 DIAGNOSIS — I482 Chronic atrial fibrillation: Secondary | ICD-10-CM | POA: Diagnosis not present

## 2016-11-08 DIAGNOSIS — N289 Disorder of kidney and ureter, unspecified: Secondary | ICD-10-CM | POA: Diagnosis not present

## 2016-11-10 ENCOUNTER — Encounter (HOSPITAL_COMMUNITY): Payer: Self-pay

## 2016-11-12 MED ORDER — TRANEXAMIC ACID 1000 MG/10ML IV SOLN
1000.0000 mg | INTRAVENOUS | Status: AC
  Administered 2016-11-15: 1000 mg via INTRAVENOUS
  Filled 2016-11-12: qty 10

## 2016-11-12 MED ORDER — DEXTROSE 5 % IV SOLN
3.0000 g | INTRAVENOUS | Status: AC
  Administered 2016-11-15: 3 g via INTRAVENOUS
  Filled 2016-11-12: qty 3000

## 2016-11-12 MED ORDER — ACETAMINOPHEN 500 MG PO TABS
1000.0000 mg | ORAL_TABLET | ORAL | Status: AC
  Administered 2016-11-15: 1000 mg via ORAL
  Filled 2016-11-12: qty 2

## 2016-11-12 MED ORDER — DEXAMETHASONE SODIUM PHOSPHATE 10 MG/ML IJ SOLN
8.0000 mg | INTRAMUSCULAR | Status: DC
  Filled 2016-11-12: qty 1

## 2016-11-12 MED ORDER — GABAPENTIN 300 MG PO CAPS
300.0000 mg | ORAL_CAPSULE | ORAL | Status: AC
  Administered 2016-11-15: 300 mg via ORAL
  Filled 2016-11-12: qty 1

## 2016-11-12 MED ORDER — BUPIVACAINE LIPOSOME 1.3 % IJ SUSP
20.0000 mL | INTRAMUSCULAR | Status: AC
  Administered 2016-11-15: 20 mL
  Filled 2016-11-12: qty 20
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
## 2016-11-15 ENCOUNTER — Encounter (HOSPITAL_COMMUNITY): Payer: Self-pay

## 2016-11-15 ENCOUNTER — Encounter (HOSPITAL_COMMUNITY)
Admission: RE | Disposition: A | Discharge status: Home Health | Payer: Self-pay | Source: Ambulatory Visit | Attending: Orthopedic Surgery

## 2016-11-15 ENCOUNTER — Ambulatory Visit (HOSPITAL_COMMUNITY): Payer: Medicare Other | Admitting: Emergency Medicine

## 2016-11-15 ENCOUNTER — Inpatient Hospital Stay (HOSPITAL_COMMUNITY)
Admission: RE | Admit: 2016-11-15 | Discharge: 2016-11-17 | DRG: 470 | Disposition: A | Discharge status: Home Health | Payer: Medicare Other | Source: Ambulatory Visit | Attending: Orthopedic Surgery | Admitting: Orthopedic Surgery

## 2016-11-15 ENCOUNTER — Ambulatory Visit (HOSPITAL_COMMUNITY): Payer: Medicare Other | Admitting: Certified Registered"

## 2016-11-15 DIAGNOSIS — Z87891 Personal history of nicotine dependence: Secondary | ICD-10-CM | POA: Diagnosis not present

## 2016-11-15 DIAGNOSIS — Z96659 Presence of unspecified artificial knee joint: Secondary | ICD-10-CM

## 2016-11-15 DIAGNOSIS — M1712 Unilateral primary osteoarthritis, left knee: Secondary | ICD-10-CM | POA: Diagnosis not present

## 2016-11-15 DIAGNOSIS — Z79899 Other long term (current) drug therapy: Secondary | ICD-10-CM

## 2016-11-15 DIAGNOSIS — Z794 Long term (current) use of insulin: Secondary | ICD-10-CM

## 2016-11-15 DIAGNOSIS — Z8546 Personal history of malignant neoplasm of prostate: Secondary | ICD-10-CM | POA: Diagnosis not present

## 2016-11-15 DIAGNOSIS — I5022 Chronic systolic (congestive) heart failure: Secondary | ICD-10-CM | POA: Diagnosis not present

## 2016-11-15 DIAGNOSIS — I110 Hypertensive heart disease with heart failure: Secondary | ICD-10-CM | POA: Diagnosis present

## 2016-11-15 DIAGNOSIS — I482 Chronic atrial fibrillation: Secondary | ICD-10-CM | POA: Diagnosis present

## 2016-11-15 DIAGNOSIS — E1151 Type 2 diabetes mellitus with diabetic peripheral angiopathy without gangrene: Secondary | ICD-10-CM | POA: Diagnosis present

## 2016-11-15 DIAGNOSIS — I509 Heart failure, unspecified: Secondary | ICD-10-CM | POA: Diagnosis present

## 2016-11-15 DIAGNOSIS — E785 Hyperlipidemia, unspecified: Secondary | ICD-10-CM | POA: Diagnosis present

## 2016-11-15 DIAGNOSIS — K449 Diaphragmatic hernia without obstruction or gangrene: Secondary | ICD-10-CM | POA: Diagnosis present

## 2016-11-15 DIAGNOSIS — I10 Essential (primary) hypertension: Secondary | ICD-10-CM | POA: Diagnosis not present

## 2016-11-15 DIAGNOSIS — G8918 Other acute postprocedural pain: Secondary | ICD-10-CM | POA: Diagnosis not present

## 2016-11-15 HISTORY — PX: TOTAL KNEE ARTHROPLASTY: SHX125

## 2016-11-15 HISTORY — DX: Unspecified osteoarthritis, unspecified site: M19.90

## 2016-11-15 LAB — CBC
HCT: 42.8 % (ref 39.0–52.0)
Hemoglobin: 13.5 g/dL (ref 13.0–17.0)
MCH: 30.1 pg (ref 26.0–34.0)
MCHC: 31.5 g/dL (ref 30.0–36.0)
MCV: 95.5 fL (ref 78.0–100.0)
Platelets: 146 10*3/uL — ABNORMAL LOW (ref 150–400)
RBC: 4.48 MIL/uL (ref 4.22–5.81)
RDW: 13.9 % (ref 11.5–15.5)
WBC: 7.7 10*3/uL (ref 4.0–10.5)

## 2016-11-15 LAB — PROTIME-INR
INR: 1.36
Prothrombin Time: 16.9 seconds — ABNORMAL HIGH (ref 11.4–15.2)

## 2016-11-15 LAB — GLUCOSE, CAPILLARY
Glucose-Capillary: 135 mg/dL — ABNORMAL HIGH (ref 65–99)
Glucose-Capillary: 164 mg/dL — ABNORMAL HIGH (ref 65–99)
Glucose-Capillary: 269 mg/dL — ABNORMAL HIGH (ref 65–99)

## 2016-11-15 LAB — CREATININE, SERUM
Creatinine, Ser: 1.78 mg/dL — ABNORMAL HIGH (ref 0.61–1.24)
GFR calc Af Amer: 40 mL/min — ABNORMAL LOW (ref >60)
GFR calc non Af Amer: 34 mL/min — ABNORMAL LOW (ref >60)

## 2016-11-15 SURGERY — ARTHROPLASTY, KNEE, TOTAL
Anesthesia: General | Laterality: Left

## 2016-11-15 MED ORDER — PROPOFOL 10 MG/ML IV BOLUS
INTRAVENOUS | Status: DC | PRN
  Administered 2016-11-15: 160 mg via INTRAVENOUS

## 2016-11-15 MED ORDER — SODIUM CHLORIDE 0.9 % IJ SOLN
INTRAMUSCULAR | Status: DC | PRN
Start: 2016-11-15 — End: 2016-11-15
  Administered 2016-11-15: 20 mL

## 2016-11-15 MED ORDER — ENOXAPARIN SODIUM 30 MG/0.3ML ~~LOC~~ SOLN
30.0000 mg | SUBCUTANEOUS | Status: DC
  Administered 2016-11-16 – 2016-11-17 (×2): 30 mg via SUBCUTANEOUS
  Filled 2016-11-15 (×2): qty 0.3

## 2016-11-15 MED ORDER — PHENOL 1.4 % MT LIQD: 1.0000 | OROMUCOSAL | Status: DC | PRN

## 2016-11-15 MED ORDER — ONDANSETRON HCL 4 MG/2ML IJ SOLN
4.0000 mg | Freq: Four times a day (QID) | INTRAMUSCULAR | Status: DC | PRN
  Administered 2016-11-15: 4 mg via INTRAVENOUS
  Filled 2016-11-15: qty 2

## 2016-11-15 MED ORDER — MENTHOL 3 MG MT LOZG: 1.0000 | LOZENGE | OROMUCOSAL | Status: DC | PRN

## 2016-11-15 MED ORDER — FENTANYL CITRATE (PF) 100 MCG/2ML IJ SOLN
INTRAMUSCULAR | Status: AC
  Administered 2016-11-15: 25 ug via INTRAVENOUS
  Filled 2016-11-15: qty 2

## 2016-11-15 MED ORDER — ACETAMINOPHEN 650 MG RE SUPP: 650.0000 mg | Freq: Four times a day (QID) | RECTAL | Status: DC | PRN

## 2016-11-15 MED ORDER — INSULIN GLARGINE 100 UNIT/ML ~~LOC~~ SOLN
70.0000 [IU] | Freq: Every day | SUBCUTANEOUS | Status: DC
  Administered 2016-11-15 – 2016-11-16 (×2): 70 [IU] via SUBCUTANEOUS
  Filled 2016-11-15 (×4): qty 0.7

## 2016-11-15 MED ORDER — WARFARIN SODIUM 5 MG PO TABS
5.0000 mg | ORAL_TABLET | Freq: Once | ORAL | Status: AC
  Administered 2016-11-15: 5 mg via ORAL
  Filled 2016-11-15: qty 1

## 2016-11-15 MED ORDER — DEXAMETHASONE SODIUM PHOSPHATE 10 MG/ML IJ SOLN
10.0000 mg | Freq: Once | INTRAMUSCULAR | Status: AC
  Administered 2016-11-16: 10 mg via INTRAVENOUS
  Filled 2016-11-15: qty 1

## 2016-11-15 MED ORDER — LIDOCAINE 2% (20 MG/ML) 5 ML SYRINGE
INTRAMUSCULAR | Status: DC | PRN
Start: 2016-11-15 — End: 2016-11-15
  Administered 2016-11-15: 100 mg via INTRAVENOUS

## 2016-11-15 MED ORDER — BUPIVACAINE-EPINEPHRINE (PF) 0.25% -1:200000 IJ SOLN
INTRAMUSCULAR | Status: DC | PRN
  Administered 2016-11-15: 30 mL via PERINEURAL

## 2016-11-15 MED ORDER — ONDANSETRON HCL 4 MG/2ML IJ SOLN
INTRAMUSCULAR | Status: DC | PRN
Start: 2016-11-15 — End: 2016-11-15
  Administered 2016-11-15: 4 mg via INTRAVENOUS

## 2016-11-15 MED ORDER — ONDANSETRON HCL 4 MG PO TABS: 4.0000 mg | ORAL_TABLET | Freq: Four times a day (QID) | ORAL | Status: DC | PRN

## 2016-11-15 MED ORDER — METOCLOPRAMIDE HCL 5 MG PO TABS: 5.0000 mg | ORAL_TABLET | Freq: Three times a day (TID) | ORAL | Status: DC | PRN

## 2016-11-15 MED ORDER — BISACODYL 5 MG PO TBEC: 5.0000 mg | DELAYED_RELEASE_TABLET | Freq: Every day | ORAL | Status: DC | PRN

## 2016-11-15 MED ORDER — WARFARIN - PHARMACIST DOSING INPATIENT: Freq: Every day | Status: DC

## 2016-11-15 MED ORDER — SODIUM CHLORIDE 0.9 % IR SOLN
Status: DC | PRN
  Administered 2016-11-15: 3000 mL

## 2016-11-15 MED ORDER — CARVEDILOL 25 MG PO TABS
25.0000 mg | ORAL_TABLET | Freq: Two times a day (BID) | ORAL | Status: DC
  Administered 2016-11-15 – 2016-11-17 (×4): 25 mg via ORAL
  Filled 2016-11-15 (×4): qty 1

## 2016-11-15 MED ORDER — SUCCINYLCHOLINE CHLORIDE 200 MG/10ML IV SOSY
PREFILLED_SYRINGE | INTRAVENOUS | Status: DC | PRN
  Administered 2016-11-15: 120 mg via INTRAVENOUS

## 2016-11-15 MED ORDER — FENTANYL CITRATE (PF) 100 MCG/2ML IJ SOLN
25.0000 ug | INTRAMUSCULAR | Status: DC | PRN
  Administered 2016-11-15: 25 ug via INTRAVENOUS

## 2016-11-15 MED ORDER — BUPIVACAINE-EPINEPHRINE (PF) 0.25% -1:200000 IJ SOLN
INTRAMUSCULAR | Status: AC
  Filled 2016-11-15: qty 30

## 2016-11-15 MED ORDER — POTASSIUM CHLORIDE CRYS ER 10 MEQ PO TBCR
10.0000 meq | EXTENDED_RELEASE_TABLET | Freq: Every day | ORAL | Status: DC
  Administered 2016-11-16 – 2016-11-17 (×2): 10 meq via ORAL
  Filled 2016-11-15 (×3): qty 1

## 2016-11-15 MED ORDER — HYDROMORPHONE HCL 1 MG/ML IJ SOLN
1.0000 mg | INTRAMUSCULAR | Status: DC | PRN
Start: 2016-11-15 — End: 2016-11-17
  Administered 2016-11-15 (×2): 1 mg via INTRAVENOUS
  Filled 2016-11-15 (×3): qty 1

## 2016-11-15 MED ORDER — ZOLPIDEM TARTRATE 5 MG PO TABS: 5.0000 mg | ORAL_TABLET | Freq: Every evening | ORAL | Status: DC | PRN

## 2016-11-15 MED ORDER — PROPOFOL 1000 MG/100ML IV EMUL
INTRAVENOUS | Status: AC
  Filled 2016-11-15: qty 300

## 2016-11-15 MED ORDER — METHOCARBAMOL 1000 MG/10ML IJ SOLN
500.0000 mg | Freq: Four times a day (QID) | INTRAVENOUS | Status: DC | PRN
  Filled 2016-11-15: qty 5

## 2016-11-15 MED ORDER — ATORVASTATIN CALCIUM 20 MG PO TABS
20.0000 mg | ORAL_TABLET | Freq: Every day | ORAL | Status: DC
  Administered 2016-11-15 – 2016-11-17 (×2): 20 mg via ORAL
  Filled 2016-11-15 (×3): qty 1

## 2016-11-15 MED ORDER — CHLORHEXIDINE GLUCONATE 4 % EX LIQD: 60.0000 mL | Freq: Once | CUTANEOUS | Status: DC

## 2016-11-15 MED ORDER — SUGAMMADEX SODIUM 500 MG/5ML IV SOLN
INTRAVENOUS | Status: AC
  Filled 2016-11-15: qty 5

## 2016-11-15 MED ORDER — OXYCODONE HCL 5 MG PO TABS
5.0000 mg | ORAL_TABLET | ORAL | Status: DC | PRN
  Administered 2016-11-15: 10 mg via ORAL
  Administered 2016-11-16: 5 mg via ORAL
  Administered 2016-11-16 – 2016-11-17 (×4): 10 mg via ORAL
  Filled 2016-11-15: qty 2
  Filled 2016-11-15: qty 1
  Filled 2016-11-15 (×5): qty 2

## 2016-11-15 MED ORDER — ONDANSETRON HCL 4 MG/2ML IJ SOLN
INTRAMUSCULAR | Status: AC
  Filled 2016-11-15: qty 2

## 2016-11-15 MED ORDER — HYDROCODONE-ACETAMINOPHEN 7.5-325 MG PO TABS
1.0000 | ORAL_TABLET | Freq: Four times a day (QID) | ORAL | Status: DC
  Administered 2016-11-15 – 2016-11-17 (×7): 1 via ORAL
  Filled 2016-11-15 (×7): qty 1

## 2016-11-15 MED ORDER — LACTATED RINGERS IV SOLN
INTRAVENOUS | Status: DC
  Administered 2016-11-15: 08:00:00 via INTRAVENOUS

## 2016-11-15 MED ORDER — SENNOSIDES-DOCUSATE SODIUM 8.6-50 MG PO TABS: 1.0000 | ORAL_TABLET | Freq: Every evening | ORAL | Status: DC | PRN

## 2016-11-15 MED ORDER — SUGAMMADEX SODIUM 200 MG/2ML IV SOLN: INTRAVENOUS | Status: DC | PRN

## 2016-11-15 MED ORDER — FUROSEMIDE 20 MG PO TABS
60.0000 mg | ORAL_TABLET | Freq: Every day | ORAL | Status: DC
  Administered 2016-11-15 – 2016-11-17 (×3): 60 mg via ORAL
  Filled 2016-11-15 (×3): qty 1

## 2016-11-15 MED ORDER — METOCLOPRAMIDE HCL 5 MG/ML IJ SOLN: 5.0000 mg | Freq: Three times a day (TID) | INTRAMUSCULAR | Status: DC | PRN

## 2016-11-15 MED ORDER — TRANEXAMIC ACID 1000 MG/10ML IV SOLN
1000.0000 mg | Freq: Once | INTRAVENOUS | Status: AC
  Administered 2016-11-15: 1000 mg via INTRAVENOUS
  Filled 2016-11-15 (×2): qty 10

## 2016-11-15 MED ORDER — CELECOXIB 200 MG PO CAPS
200.0000 mg | ORAL_CAPSULE | Freq: Two times a day (BID) | ORAL | Status: DC
  Administered 2016-11-15 – 2016-11-17 (×5): 200 mg via ORAL
  Filled 2016-11-15 (×5): qty 1

## 2016-11-15 MED ORDER — SUCCINYLCHOLINE CHLORIDE 200 MG/10ML IV SOSY
PREFILLED_SYRINGE | INTRAVENOUS | Status: AC
  Filled 2016-11-15: qty 10

## 2016-11-15 MED ORDER — DEXAMETHASONE SODIUM PHOSPHATE 10 MG/ML IJ SOLN
INTRAMUSCULAR | Status: DC | PRN
  Administered 2016-11-15: 5 mg via INTRAVENOUS

## 2016-11-15 MED ORDER — DOCUSATE SODIUM 100 MG PO CAPS
100.0000 mg | ORAL_CAPSULE | Freq: Two times a day (BID) | ORAL | Status: DC
  Administered 2016-11-15 – 2016-11-17 (×5): 100 mg via ORAL
  Filled 2016-11-15 (×5): qty 1

## 2016-11-15 MED ORDER — DIGOXIN 125 MCG PO TABS
0.1250 mg | ORAL_TABLET | ORAL | Status: DC
  Administered 2016-11-16: 0.125 mg via ORAL
  Filled 2016-11-15: qty 1

## 2016-11-15 MED ORDER — FENTANYL CITRATE (PF) 250 MCG/5ML IJ SOLN
INTRAMUSCULAR | Status: DC | PRN
  Administered 2016-11-15 (×2): 25 ug via INTRAVENOUS

## 2016-11-15 MED ORDER — GABAPENTIN 300 MG PO CAPS
300.0000 mg | ORAL_CAPSULE | Freq: Three times a day (TID) | ORAL | Status: DC
Start: 2016-11-15 — End: 2016-11-17
  Administered 2016-11-15 – 2016-11-17 (×7): 300 mg via ORAL
  Filled 2016-11-15 (×7): qty 1

## 2016-11-15 MED ORDER — CAPTOPRIL-HYDROCHLOROTHIAZIDE 25-15 MG PO TABS: 1.0000 | ORAL_TABLET | Freq: Two times a day (BID) | ORAL | Status: DC

## 2016-11-15 MED ORDER — SUGAMMADEX SODIUM 200 MG/2ML IV SOLN
INTRAVENOUS | Status: DC | PRN
  Administered 2016-11-15: 253.2 mg via INTRAVENOUS

## 2016-11-15 MED ORDER — FENTANYL CITRATE (PF) 250 MCG/5ML IJ SOLN
INTRAMUSCULAR | Status: AC
  Filled 2016-11-15: qty 5

## 2016-11-15 MED ORDER — METHOCARBAMOL 500 MG PO TABS
500.0000 mg | ORAL_TABLET | Freq: Four times a day (QID) | ORAL | Status: DC | PRN
  Administered 2016-11-15 – 2016-11-17 (×3): 500 mg via ORAL
  Filled 2016-11-15 (×3): qty 1

## 2016-11-15 MED ORDER — CAPTOPRIL 25 MG PO TABS
25.0000 mg | ORAL_TABLET | Freq: Two times a day (BID) | ORAL | Status: DC
  Administered 2016-11-15 – 2016-11-17 (×5): 25 mg via ORAL
  Filled 2016-11-15 (×6): qty 1

## 2016-11-15 MED ORDER — PHENYLEPHRINE 40 MCG/ML (10ML) SYRINGE FOR IV PUSH (FOR BLOOD PRESSURE SUPPORT)
PREFILLED_SYRINGE | INTRAVENOUS | Status: AC
  Filled 2016-11-15: qty 10

## 2016-11-15 MED ORDER — HYDROCHLOROTHIAZIDE 12.5 MG PO CAPS
12.5000 mg | ORAL_CAPSULE | Freq: Two times a day (BID) | ORAL | Status: DC
  Administered 2016-11-15 – 2016-11-17 (×5): 12.5 mg via ORAL
  Filled 2016-11-15 (×5): qty 1

## 2016-11-15 MED ORDER — ACETAMINOPHEN 325 MG PO TABS: 650.0000 mg | ORAL_TABLET | Freq: Four times a day (QID) | ORAL | Status: DC | PRN

## 2016-11-15 MED ORDER — ALUM & MAG HYDROXIDE-SIMETH 200-200-20 MG/5ML PO SUSP: 30.0000 mL | ORAL | Status: DC | PRN

## 2016-11-15 MED ORDER — FLEET ENEMA 7-19 GM/118ML RE ENEM: 1.0000 | ENEMA | Freq: Once | RECTAL | Status: DC | PRN

## 2016-11-15 MED ORDER — PROPOFOL 10 MG/ML IV BOLUS
INTRAVENOUS | Status: AC
  Filled 2016-11-15: qty 20

## 2016-11-15 MED ORDER — INSULIN ASPART 100 UNIT/ML ~~LOC~~ SOLN
10.0000 [IU] | Freq: Three times a day (TID) | SUBCUTANEOUS | Status: DC
  Administered 2016-11-15 – 2016-11-17 (×6): 10 [IU] via SUBCUTANEOUS

## 2016-11-15 MED ORDER — PHENYLEPHRINE 40 MCG/ML (10ML) SYRINGE FOR IV PUSH (FOR BLOOD PRESSURE SUPPORT)
PREFILLED_SYRINGE | INTRAVENOUS | Status: DC | PRN
  Administered 2016-11-15: 80 ug via INTRAVENOUS
  Administered 2016-11-15: 40 ug via INTRAVENOUS
  Administered 2016-11-15: 120 ug via INTRAVENOUS
  Administered 2016-11-15: 40 ug via INTRAVENOUS

## 2016-11-15 MED ORDER — CEFAZOLIN SODIUM-DEXTROSE 2-4 GM/100ML-% IV SOLN
2.0000 g | Freq: Three times a day (TID) | INTRAVENOUS | Status: AC
  Administered 2016-11-15 (×2): 2 g via INTRAVENOUS
  Filled 2016-11-15 (×2): qty 100

## 2016-11-15 MED ORDER — DIPHENHYDRAMINE HCL 12.5 MG/5ML PO ELIX: 12.5000 mg | ORAL_SOLUTION | ORAL | Status: DC | PRN

## 2016-11-15 MED ORDER — ROCURONIUM BROMIDE 10 MG/ML (PF) SYRINGE
PREFILLED_SYRINGE | INTRAVENOUS | Status: DC | PRN
  Administered 2016-11-15: 60 mg via INTRAVENOUS

## 2016-11-15 MED ORDER — MIDAZOLAM HCL 5 MG/5ML IJ SOLN
INTRAMUSCULAR | Status: DC | PRN
  Administered 2016-11-15 (×2): 1 mg via INTRAVENOUS

## 2016-11-15 MED ORDER — MIDAZOLAM HCL 2 MG/2ML IJ SOLN
INTRAMUSCULAR | Status: AC
  Filled 2016-11-15: qty 2

## 2016-11-15 SURGICAL SUPPLY — 64 items
BANDAGE ACE 6X5 VEL STRL LF (GAUZE/BANDAGES/DRESSINGS) ×3 IMPLANT
BANDAGE ESMARK 6X9 LF (GAUZE/BANDAGES/DRESSINGS) ×1 IMPLANT
BLADE SAGITTAL 13X1.27X60 (BLADE) ×2 IMPLANT
BLADE SAGITTAL 13X1.27X60MM (BLADE) ×1
BLADE SAW SGTL 83.5X18.5 (BLADE) ×3 IMPLANT
BLADE SURG 10 STRL SS (BLADE) ×3 IMPLANT
BNDG CMPR 9X6 STRL LF SNTH (GAUZE/BANDAGES/DRESSINGS) ×1
BNDG ESMARK 6X9 LF (GAUZE/BANDAGES/DRESSINGS) ×3
BOWL SMART MIX CTS (DISPOSABLE) ×3 IMPLANT
CAPT KNEE TOTAL 3 ×3 IMPLANT
CEMENT BONE SIMPLEX SPEEDSET (Cement) ×6 IMPLANT
CLOSURE WOUND 1/2 X4 (GAUZE/BANDAGES/DRESSINGS) ×1
COVER SURGICAL LIGHT HANDLE (MISCELLANEOUS) ×3 IMPLANT
CUFF TOURNIQUET SINGLE 34IN LL (TOURNIQUET CUFF) ×3 IMPLANT
DRAPE EXTREMITY T 121X128X90 (DRAPE) ×3 IMPLANT
DRAPE HALF SHEET 40X57 (DRAPES) ×3 IMPLANT
DRAPE INCISE IOBAN 66X45 STRL (DRAPES) ×6 IMPLANT
DRAPE U-SHAPE 47X51 STRL (DRAPES) ×3 IMPLANT
DRSG AQUACEL AG ADV 3.5X10 (GAUZE/BANDAGES/DRESSINGS) ×3 IMPLANT
DURAPREP 26ML APPLICATOR (WOUND CARE) ×6 IMPLANT
ELECT REM PT RETURN 9FT ADLT (ELECTROSURGICAL) ×3
ELECTRODE REM PT RTRN 9FT ADLT (ELECTROSURGICAL) ×1 IMPLANT
FILTER STRAW FLUID ASPIR (MISCELLANEOUS) IMPLANT
GLOVE BIOGEL M 7.0 STRL (GLOVE) IMPLANT
GLOVE BIOGEL PI IND STRL 7.5 (GLOVE) IMPLANT
GLOVE BIOGEL PI IND STRL 8.5 (GLOVE) ×1 IMPLANT
GLOVE BIOGEL PI INDICATOR 7.5 (GLOVE)
GLOVE BIOGEL PI INDICATOR 8.5 (GLOVE) ×2
GLOVE SURG ORTHO 8.0 STRL STRW (GLOVE) ×6 IMPLANT
GOWN STRL REUS W/ TWL LRG LVL3 (GOWN DISPOSABLE) ×1 IMPLANT
GOWN STRL REUS W/ TWL XL LVL3 (GOWN DISPOSABLE) ×2 IMPLANT
GOWN STRL REUS W/TWL 2XL LVL3 (GOWN DISPOSABLE) ×3 IMPLANT
GOWN STRL REUS W/TWL LRG LVL3 (GOWN DISPOSABLE) ×3
GOWN STRL REUS W/TWL XL LVL3 (GOWN DISPOSABLE) ×6
HANDPIECE INTERPULSE COAX TIP (DISPOSABLE) ×3
HOOD PEEL AWAY FACE SHEILD DIS (HOOD) ×9 IMPLANT
KIT BASIN OR (CUSTOM PROCEDURE TRAY) ×3 IMPLANT
KIT ROOM TURNOVER OR (KITS) ×3 IMPLANT
KNEE CAPITATED TOTAL 3 IMPLANT
MANIFOLD NEPTUNE II (INSTRUMENTS) ×3 IMPLANT
NDL 18GX1X1/2 (RX/OR ONLY) (NEEDLE) IMPLANT
NEEDLE 18GX1X1/2 (RX/OR ONLY) (NEEDLE) IMPLANT
NEEDLE 22X1 1/2 (OR ONLY) (NEEDLE) ×6 IMPLANT
NS IRRIG 1000ML POUR BTL (IV SOLUTION) ×3 IMPLANT
PACK TOTAL JOINT (CUSTOM PROCEDURE TRAY) ×3 IMPLANT
PAD ARMBOARD 7.5X6 YLW CONV (MISCELLANEOUS) ×6 IMPLANT
SET HNDPC FAN SPRY TIP SCT (DISPOSABLE) ×1 IMPLANT
STRIP CLOSURE SKIN 1/2X4 (GAUZE/BANDAGES/DRESSINGS) ×2 IMPLANT
SUCTION FRAZIER HANDLE 10FR (MISCELLANEOUS)
SUCTION TUBE FRAZIER 10FR DISP (MISCELLANEOUS) IMPLANT
SUT MNCRL AB 3-0 PS2 18 (SUTURE) ×3 IMPLANT
SUT VIC AB 0 CT1 27 (SUTURE) ×6
SUT VIC AB 0 CT1 27XBRD ANBCTR (SUTURE) IMPLANT
SUT VIC AB 0 CTB1 27 (SUTURE) ×6 IMPLANT
SUT VIC AB 1 CT1 27 (SUTURE) ×9
SUT VIC AB 1 CT1 27XBRD ANBCTR (SUTURE) ×2 IMPLANT
SUT VIC AB 2-0 CT1 27 (SUTURE) ×9
SUT VIC AB 2-0 CT1 TAPERPNT 27 (SUTURE) ×2 IMPLANT
SYR 20CC LL (SYRINGE) ×6 IMPLANT
SYR TB 1ML LUER SLIP (SYRINGE) IMPLANT
TOWEL OR 17X24 6PK STRL BLUE (TOWEL DISPOSABLE) ×1 IMPLANT
TOWEL OR 17X26 10 PK STRL BLUE (TOWEL DISPOSABLE) ×3 IMPLANT
TRAY CATH 16FR W/PLASTIC CATH (SET/KITS/TRAYS/PACK) IMPLANT
WRAP KNEE MAXI GEL POST OP (GAUZE/BANDAGES/DRESSINGS) ×3 IMPLANT

## 2016-11-15 NOTE — Anesthesia Procedure Notes (Signed)
Anesthesia Regional Block: Adductor canal block   Pre-Anesthetic Checklist: ,, timeout performed, Correct Patient, Correct Site, Correct Laterality, Correct Procedure, Correct Position, site marked, Risks and benefits discussed,  Surgical consent,  Pre-op evaluation,  At surgeon's request and post-op pain management  Laterality: Left  Prep: chloraprep       Needles:   Needle Type: Stimulator Needle - 80          Additional Needles:   Procedures: Doppler guided, Ultrasound guided,,,,,,  Narrative:  Start time: 11/15/2016 8:00 AM End time: 11/15/2016 8:15 AM Injection made incrementally with aspirations every 5 mL.  Performed by: Personally  Anesthesiologist: Belinda Block

## 2016-11-15 NOTE — Anesthesia Postprocedure Evaluation (Signed)
Anesthesia Post Note  Patient: Dennis Williams  Procedure(s) Performed: Procedure(s) (LRB): TOTAL KNEE ARTHROPLASTY (Left)     Patient location during evaluation: PACU Anesthesia Type: General Level of consciousness: awake Pain management: pain level controlled Vital Signs Assessment: post-procedure vital signs reviewed and stable Respiratory status: spontaneous breathing Cardiovascular status: stable Anesthetic complications: no    Last Vitals:  Vitals:   11/15/16 1124 11/15/16 1139  BP: 125/78 137/81  Pulse: 81 71  Resp: (!) 28 (!) 22  Temp:      Last Pain:  Vitals:   11/15/16 1421  TempSrc:   PainSc: 3                  Brylan Seubert
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
## 2016-11-15 NOTE — Anesthesia Procedure Notes (Signed)
Procedure Name: Intubation Date/Time: 11/15/2016 8:41 AM Performed by: Freddie Breech Pre-anesthesia Checklist: Patient identified, Emergency Drugs available, Suction available and Patient being monitored Patient Re-evaluated:Patient Re-evaluated prior to inductionOxygen Delivery Method: Circle System Utilized Preoxygenation: Pre-oxygenation with 100% oxygen Intubation Type: IV induction Ventilation: Mask ventilation without difficulty Laryngoscope Size: Mac and 4 Grade View: Grade II Tube type: Oral Tube size: 8.0 mm Number of attempts: 1 Airway Equipment and Method: Stylet Placement Confirmation: ETT inserted through vocal cords under direct vision,  positive ETCO2 and breath sounds checked- equal and bilateral Secured at: 23 cm Tube secured with: Tape Dental Injury: Teeth and Oropharynx as per pre-operative assessment

## 2016-11-15 NOTE — Anesthesia Preprocedure Evaluation (Addendum)
Anesthesia Evaluation  Patient identified by MRN, date of birth, ID band Patient awake    Reviewed: Allergy & Precautions, NPO status , Patient's Chart, lab work & pertinent test results  Airway Mallampati: II  TM Distance: >3 FB     Dental   Pulmonary former smoker,    breath sounds clear to auscultation       Cardiovascular hypertension, +CHF   Rhythm:Regular Rate:Normal     Neuro/Psych  Neuromuscular disease    GI/Hepatic Neg liver ROS, hiatal hernia,   Endo/Other  diabetes  Renal/GU Renal disease     Musculoskeletal   Abdominal   Peds  Hematology   Anesthesia Other Findings   Reproductive/Obstetrics                             Anesthesia Physical Anesthesia Plan  ASA: III  Anesthesia Plan: General   Post-op Pain Management:  Regional for Post-op pain   Induction: Intravenous  PONV Risk Score and Plan: 2 and Ondansetron, Dexamethasone and Propofol  Airway Management Planned: Oral ETT  Additional Equipment:   Intra-op Plan:   Post-operative Plan: Possible Post-op intubation/ventilation  Informed Consent: I have reviewed the patients History and Physical, chart, labs and discussed the procedure including the risks, benefits and alternatives for the proposed anesthesia with the patient or authorized representative who has indicated his/her understanding and acceptance.     Plan Discussed with: Anesthesiologist and CRNA  Anesthesia Plan Comments:         Anesthesia Quick Evaluation

## 2016-11-15 NOTE — Discharge Instructions (Signed)

## 2016-11-15 NOTE — Transfer of Care (Signed)
Immediate Anesthesia Transfer of Care Note  Patient: Dennis Williams  Procedure(s) Performed: Procedure(s): TOTAL KNEE ARTHROPLASTY (Left)  Patient Location: PACU  Anesthesia Type:General  Level of Consciousness: drowsy and patient cooperative  Airway & Oxygen Therapy: Patient Spontanous Breathing and Patient connected to face mask oxygen  Post-op Assessment: Report given to RN and Post -op Vital signs reviewed and stable  Post vital signs: Reviewed and stable  Last Vitals:  Vitals:   11/15/16 0626 11/15/16 1053  BP: (!) 152/89   Pulse:    Resp:    Temp:  36.1 C    Last Pain:  Vitals:   11/15/16 0623  TempSrc: Oral         Complications: No apparent anesthesia complications

## 2016-11-15 NOTE — Evaluation (Signed)
Physical Therapy Evaluation Patient Details Name: Dennis Williams MRN: [id number] DOB: [birth year] Today's Date: 11/15/2016   History of Present Illness   Pt is a 89 male s/p L TKA. PMH: CHF, HTN, AFIB. PSH: back surgery.  Clinical Impression  Pt is s/p TKA resulting in the deficits listed below (see PT Problem List). Pt will need to be at supervision level for safe d/c home. Pt currently needs assist for all transfers. Pt will benefit from skilled PT to increase their independence and safety with mobility to allow discharge to the venue listed below.      Follow Up Recommendations Home health PT;Supervision/Assistance - 24 hour    Equipment Recommendations  None recommended by PT (has equip delivered already)    Recommendations for Other Services       Precautions / Restrictions Precautions Precautions: Fall;Knee Precaution Booklet Issued: No Precaution Comments: educated on not to put anything under knee Restrictions Weight Bearing Restrictions: Yes LLE Weight Bearing: Weight bearing as tolerated      Mobility  Bed Mobility Overal bed mobility: Needs Assistance Bed Mobility: Supine to Sit     Supine to sit: Mod assist     General bed mobility comments: assist for trunk elevation, v/c's for LE mangement  Transfers Overall transfer level: Needs assistance Equipment used: Rolling walker (2 wheeled) Transfers: Sit to/from [facility] Sit to Stand: Min assist Stand pivot transfers: Min assist       General transfer comment: v/c's for hand placement, minA for initial power up  Ambulation/Gait         Gait velocity: slow Gait velocity interpretation: Below normal speed for age/gender General Gait Details: limited to 5 steps to chair, max directional v/c's  Stairs            Wheelchair Mobility    Modified Rankin (Stroke Patients Only)       Balance Overall balance assessment:  (needs RW for standing and amb)                                            Pertinent Vitals/Pain Pain Assessment: 0-10 Pain Score: 8  Pain Location: L Pain Descriptors / Indicators: Sore Pain Intervention(s): Monitored during session    Home Living Family/patient expects to be discharged to:: Private residence Living Arrangements: Spouse/significant other Available Help at Discharge: Family;Available 24 hours/day Type of Home: House Home Access: Level entry     Home Layout: One level Home Equipment: Walker - 2 wheels;Bedside commode      Prior Function Level of Independence: Independent               Hand Dominance   Dominant Hand: Right    Extremity/Trunk Assessment   Upper Extremity Assessment Upper Extremity Assessment: Overall WFL for tasks assessed    Lower Extremity Assessment Lower Extremity Assessment: LLE deficits/detail LLE Deficits / Details: able to initiate quad set    Cervical / Trunk Assessment Cervical / Trunk Assessment: Normal  Communication   Communication: No difficulties  Cognition Arousal/Alertness: Awake/alert Behavior During Therapy: WFL for tasks assessed/performed Overall Cognitive Status: Within Functional Limits for tasks assessed                                        General Comments  Exercises Total Joint Exercises Ankle Circles/Pumps: AROM;Both;10 reps;Supine Quad Sets: AROM;Left;10 reps;Supine Heel Slides: AAROM;Left;10 reps;Supine   Assessment/Plan    PT Assessment Patient needs continued PT services  PT Problem List Decreased strength;Decreased range of motion;Decreased activity tolerance;Decreased balance;Decreased mobility;Decreased knowledge of use of DME;Pain       PT Treatment Interventions DME instruction;Gait training;Stair training;Therapeutic activities;Functional mobility training;Therapeutic exercise;Balance training;Neuromuscular re-education    PT Goals (Current goals can be found in the Care Plan section)   Acute Rehab PT Goals Patient Stated Goal: stop the pain PT Goal Formulation: With patient Time For Goal Achievement: 11/22/16 Potential to Achieve Goals: Good    Frequency 7X/week   Barriers to discharge        Co-evaluation               AM-PAC PT "6 Clicks" Daily Activity  Outcome Measure Difficulty turning over in bed (including adjusting bedclothes, sheets and blankets)?: A Lot Difficulty moving from lying on back to sitting on the side of the bed? : A Lot Difficulty sitting down on and standing up from a chair with arms (e.g., wheelchair, bedside commode, etc,.)?: A Little Help needed moving to and from a bed to chair (including a wheelchair)?: A Little Help needed walking in hospital room?: A Little Help needed climbing 3-5 steps with a railing? : A Little 6 Click Score: 16    End of Session Equipment Utilized During Treatment: Gait belt Activity Tolerance: Patient tolerated treatment well Patient left: in chair;with call bell/phone within reach;with family/visitor present Nurse Communication: Mobility status PT Visit Diagnosis: Unsteadiness on feet (R26.81);Muscle weakness (generalized) (M62.81);Pain Pain - Right/Left: Left Pain - part of body: Knee    Time: 0923-3007 PT Time Calculation (min) (ACUTE ONLY): 23 min   Charges:   PT Evaluation $PT Eval Moderate Complexity: 1 Procedure PT Treatments $Therapeutic Exercise: 8-22 mins   PT G Codes:   PT G-Codes **NOT FOR INPATIENT CLASS** Functional Assessment Tool Used: Clinical judgement Functional Limitation: Mobility: Walking and moving around Mobility: Walking and Moving Around Current Status (M2263): At least 60 percent but less than 80 percent impaired, limited or restricted Mobility: Walking and Moving Around Goal Status [phone]): At least 20 percent but less than 40 percent impaired, limited or restricted    Kittie Plater, PT, DPT Pager #: [phone] Office #: [phone]   [place] 11/15/2016, 3:51 PM

## 2016-11-15 NOTE — Progress Notes (Signed)
Orthopedic Tech Progress Note Patient Details:  Dennis Williams 1934-05-02 [id number]  Ortho Devices Type of Ortho Device: Other (comment) Ortho Device/Splint Location: applied cpm to pt left leg/knee. pt tolerated well. degrees of cpm is at 0-90.  bone foam provided at bedside.   Left Knee   Kristopher Oppenheim 11/15/2016, 11:28 AM

## 2016-11-16 ENCOUNTER — Encounter (HOSPITAL_COMMUNITY): Payer: Self-pay

## 2016-11-16 DIAGNOSIS — Z8546 Personal history of malignant neoplasm of prostate: Secondary | ICD-10-CM | POA: Diagnosis not present

## 2016-11-16 DIAGNOSIS — I509 Heart failure, unspecified: Secondary | ICD-10-CM | POA: Diagnosis not present

## 2016-11-16 DIAGNOSIS — M1712 Unilateral primary osteoarthritis, left knee: Secondary | ICD-10-CM | POA: Diagnosis not present

## 2016-11-16 DIAGNOSIS — E785 Hyperlipidemia, unspecified: Secondary | ICD-10-CM | POA: Diagnosis present

## 2016-11-16 DIAGNOSIS — K449 Diaphragmatic hernia without obstruction or gangrene: Secondary | ICD-10-CM | POA: Diagnosis present

## 2016-11-16 DIAGNOSIS — Z794 Long term (current) use of insulin: Secondary | ICD-10-CM | POA: Diagnosis not present

## 2016-11-16 DIAGNOSIS — I110 Hypertensive heart disease with heart failure: Secondary | ICD-10-CM | POA: Diagnosis present

## 2016-11-16 DIAGNOSIS — E1151 Type 2 diabetes mellitus with diabetic peripheral angiopathy without gangrene: Secondary | ICD-10-CM | POA: Diagnosis present

## 2016-11-16 DIAGNOSIS — Z96652 Presence of left artificial knee joint: Secondary | ICD-10-CM | POA: Diagnosis not present

## 2016-11-16 DIAGNOSIS — Z79899 Other long term (current) drug therapy: Secondary | ICD-10-CM | POA: Diagnosis not present

## 2016-11-16 DIAGNOSIS — I5022 Chronic systolic (congestive) heart failure: Secondary | ICD-10-CM | POA: Diagnosis present

## 2016-11-16 DIAGNOSIS — I482 Chronic atrial fibrillation: Secondary | ICD-10-CM | POA: Diagnosis present

## 2016-11-16 DIAGNOSIS — Z87891 Personal history of nicotine dependence: Secondary | ICD-10-CM | POA: Diagnosis not present

## 2016-11-16 LAB — CBC
HCT: 37.9 % — ABNORMAL LOW (ref 39.0–52.0)
Hemoglobin: 11.9 g/dL — ABNORMAL LOW (ref 13.0–17.0)
MCH: 29.8 pg (ref 26.0–34.0)
MCHC: 31.4 g/dL (ref 30.0–36.0)
MCV: 95 fL (ref 78.0–100.0)
Platelets: 138 10*3/uL — ABNORMAL LOW (ref 150–400)
RBC: 3.99 MIL/uL — ABNORMAL LOW (ref 4.22–5.81)
RDW: 13.7 % (ref 11.5–15.5)
WBC: 11.6 10*3/uL — ABNORMAL HIGH (ref 4.0–10.5)

## 2016-11-16 LAB — BASIC METABOLIC PANEL
Anion gap: 8 (ref 5–15)
BUN: 33 mg/dL — ABNORMAL HIGH (ref 6–20)
CO2: 29 mmol/L (ref 22–32)
Calcium: 8 mg/dL — ABNORMAL LOW (ref 8.9–10.3)
Chloride: 98 mmol/L — ABNORMAL LOW (ref 101–111)
Creatinine, Ser: 1.91 mg/dL — ABNORMAL HIGH (ref 0.61–1.24)
GFR calc Af Amer: 37 mL/min — ABNORMAL LOW (ref >60)
GFR calc non Af Amer: 32 mL/min — ABNORMAL LOW (ref >60)
Glucose, Bld: 252 mg/dL — ABNORMAL HIGH (ref 65–99)
Potassium: 4.6 mmol/L (ref 3.5–5.1)
Sodium: 135 mmol/L (ref 135–145)

## 2016-11-16 LAB — PROTIME-INR
INR: 1.23
Prothrombin Time: 15.6 seconds — ABNORMAL HIGH (ref 11.4–15.2)

## 2016-11-16 LAB — GLUCOSE, CAPILLARY
Glucose-Capillary: 241 mg/dL — ABNORMAL HIGH (ref 65–99)
Glucose-Capillary: 232 mg/dL — ABNORMAL HIGH (ref 65–99)
Glucose-Capillary: 277 mg/dL — ABNORMAL HIGH (ref 65–99)

## 2016-11-16 LAB — HEMOGLOBIN A1C
Hgb A1c MFr Bld: 7.8 % — ABNORMAL HIGH (ref 4.8–5.6)
Mean Plasma Glucose: 177 mg/dL

## 2016-11-16 MED ORDER — WARFARIN SODIUM 5 MG PO TABS
5.0000 mg | ORAL_TABLET | Freq: Once | ORAL | Status: AC
  Administered 2016-11-16: 5 mg via ORAL
  Filled 2016-11-16: qty 1

## 2016-11-16 NOTE — Progress Notes (Addendum)
ANTICOAGULATION CONSULT NOTE - FOLLOW UP  Pharmacy Consult:  Coumadin Indication:  History of AFib + VTE px s/p TKA  Allergies  Allergen Reactions  . No Known Allergies     Patient Measurements: Weight: 279 lb (126.6 kg)  Vital Signs: Temp: 97.8 F (36.6 C) (06/19 0500) Temp Source: Oral (06/19 0500) BP: 121/92 (06/19 0846) Pulse Rate: 64 (06/19 0846)  Labs:  Recent Labs  11/15/16 0713 11/15/16 1302 11/16/16 0343  HGB  --  13.5 11.9*  HCT  --  42.8 37.9*  PLT  --  146* 138*  LABPROT 16.9*  --  15.6*  INR 1.36  --  1.23  CREATININE  --  1.78* 1.91*    Estimated Creatinine Clearance: 40 mL/min (A) (by C-G formula based on SCr of 1.91 mg/dL (H)).   Assessment: 45 YOM with history of Afib on Coumadin PTA.  Patient underwent left TKA on 11/14/16 and Pharmacy consulted to resume Coumadin.  Prophylactic Lovenox also started post-op.  Patient's INR is sub-therapeutic at 1.23.  Platelet count is decreasing; however, no bleeding reported.  Home Coumadin dose: 3.5mg  alternating with 4mg  daily   Goal of Therapy:  INR 2-3 Monitor platelets by anticoagulation protocol: Yes    Plan:  - Repeat Coumadin 5mg  PO today - Lovenox 30mg  SQ Q24H per MD.  D/C when INR >/= 1.8 per order, but will likely need to continue until INR >/= 2. - Daily PT / INR   Penelope Fittro D. Mina Marble, PharmD, BCPS Pager:  [phone] 11/16/2016, 9:45 AM

## 2016-11-16 NOTE — Progress Notes (Signed)
Physical Therapy Treatment Patient Details Name: Dennis Williams MRN: [id number] DOB: October 31, 1933 Today's Date: 11/16/2016    History of Present Illness  Pt is a 63 male s/p L TKA. PMH: CHF, HTN, AFIB. PSH: back surgery.    PT Comments    Pt performed gait and reviewed HEP during session this am.  Pt fearful of falling but progressed well.  Will return in pm for stair training.  Plan for HHPT remains appropriate.    Follow Up Recommendations  Home health PT;Supervision/Assistance - 24 hour     Equipment Recommendations  None recommended by PT    Recommendations for Other Services       Precautions / Restrictions Precautions Precautions: Fall;Knee Precaution Booklet Issued: No Precaution Comments: educated on not to put anything under knee Restrictions Weight Bearing Restrictions: Yes LLE Weight Bearing: Weight bearing as tolerated    Mobility  Bed Mobility               General bed mobility comments: Pt in chair on arrival  Transfers Overall transfer level: Needs assistance Equipment used: Rolling walker (2 wheeled) Transfers: Sit to/from Stand Sit to Stand: Min assist         General transfer comment: Cues for hand placement to and from seated surface.  Cues for LLE forward.    Ambulation/Gait Ambulation/Gait assistance: Min guard Ambulation Distance (Feet): 85 Feet Assistive device: Rolling walker (2 wheeled) Gait Pattern/deviations: Step-to pattern;Antalgic;Trunk flexed;Decreased stride length Gait velocity: slow Gait velocity interpretation: Below normal speed for age/gender General Gait Details: Pt required cues for heel strike and Knee extension on LLE in stance phase.  Pt with standing rest breaks x2 due to fatigue.     Stairs            Wheelchair Mobility    Modified Rankin (Stroke Patients Only)       Balance Overall balance assessment: Needs assistance   Sitting balance-Leahy Scale: Good       Standing balance-Leahy  Scale: Fair                              Cognition Arousal/Alertness: Awake/alert Behavior During Therapy: WFL for tasks assessed/performed Overall Cognitive Status: Within Functional Limits for tasks assessed                                        Exercises Total Joint Exercises Ankle Circles/Pumps: AROM;Left;10 reps;Supine Quad Sets: AROM;Left;10 reps;Supine Towel Squeeze: AROM;Both;10 reps;Supine Short Arc Quad: AROM;Left;10 reps;Supine Heel Slides: AAROM;Left;10 reps;Supine Hip ABduction/ADduction: Left;10 reps;AAROM;Supine Straight Leg Raises: AAROM;Left;10 reps;Supine Long Arc Quad: AROM;Left;10 reps;Seated Goniometric ROM: 91 degrees flexion with L knee.      General Comments        Pertinent Vitals/Pain Pain Assessment: 0-10 Pain Score: 8  Pain Location: L Pain Descriptors / Indicators: Sore Pain Intervention(s): Monitored during session;Repositioned    Home Living                      Prior Function            PT Goals (current goals can now be found in the care plan section) Acute Rehab PT Goals Patient Stated Goal: Go home Potential to Achieve Goals: Good Progress towards PT goals: Progressing toward goals    Frequency    7X/week  PT Plan Current plan remains appropriate    Co-evaluation              AM-PAC PT "6 Clicks" Daily Activity  Outcome Measure  Difficulty turning over in bed (including adjusting bedclothes, sheets and blankets)?: A Lot Difficulty moving from lying on back to sitting on the side of the bed? : A Lot Difficulty sitting down on and standing up from a chair with arms (e.g., wheelchair, bedside commode, etc,.)?: A Lot Help needed moving to and from a bed to chair (including a wheelchair)?: A Little Help needed walking in hospital room?: A Little Help needed climbing 3-5 steps with a railing? : A Lot 6 Click Score: 14    End of Session Equipment Utilized During Treatment:  Gait belt Activity Tolerance: Patient tolerated treatment well Patient left: in chair;with call bell/phone within reach;with family/visitor present Nurse Communication: Mobility status PT Visit Diagnosis: Unsteadiness on feet (R26.81);Muscle weakness (generalized) (M62.81);Pain Pain - Right/Left: Left Pain - part of body: Knee     Time: 0413-6438 PT Time Calculation (min) (ACUTE ONLY): 27 min  Charges:  $Gait Training: 8-22 mins $Therapeutic Exercise: 8-22 mins                    G Codes:       Governor Rooks, PTA pager [phone]    Cristela Blue 11/16/2016, 10:08 AM

## 2016-11-16 NOTE — Care Management Obs Status (Signed)
[facility] NOTIFICATION   Patient Details  Name: Dennis Williams MRN: [id number] Date of Birth: Dec 09, 1933   Medicare Observation Status Notification Given:  Yes    Carles Collet, RN 11/16/2016, 10:50 AM

## 2016-11-16 NOTE — Progress Notes (Signed)
Inpatient Diabetes Program Recommendations  AACE/ADA: New Consensus Statement on Inpatient Glycemic Control (2015)  Target Ranges:  Prepandial:   less than 140 mg/dL      Peak postprandial:   less than 180 mg/dL (1-2 hours)      Critically ill patients:  140 - 180 mg/dL   Lab Results  Component Value Date   GLUCAP 232 (H) 11/16/2016   HGBA1C 7.8 (H) 11/15/2016    Review of Glycemic Control Results for Dennis Williams, Dennis Williams (MRN [id number]) as of 11/16/2016 14:07  Ref. Range 11/15/2016 06:38 11/15/2016 10:56 11/15/2016 16:54 11/15/2016 21:45 11/16/2016 12:31  Glucose-Capillary Latest Ref Range: 65 - 99 mg/dL 135 (H) 164 (H) 269 (H) 241 (H) 232 (H)   Diabetes history: DM2 Outpatient Diabetes medications: Lantus 75 units qd + Novolog 10 units tid Current orders for Inpatient glycemic control: Lantus 70 units qd + Novolog 10 units tid   Inpatient Diabetes Program Recommendations:   Noted received steroids this am. Please consider: Novolog correction 0-9 units tid.  Thank you, Nani Gasser. Nuno Brubacher, RN, MSN, CDE  Diabetes Coordinator Inpatient Glycemic Control Team Team Pager [phone] (8am-5pm) 11/16/2016 2:08 PM

## 2016-11-16 NOTE — Progress Notes (Signed)
SPORTS MEDICINE AND JOINT REPLACEMENT  Lara Mulch, MD    [name], PA-C [place], [place], [place]  [address]                             [phone]   PROGRESS NOTE  Subjective:  negative for Chest Pain  negative for Shortness of Breath  negative for Nausea/Vomiting   negative for Calf Pain  negative for Bowel Movement   Tolerating Diet: yes         Patient reports pain as 4 on 0-10 scale.    Objective: Vital signs in last 24 hours:   Patient Vitals for the past 24 hrs:  BP Temp Temp src Pulse Resp SpO2  11/16/16 0500 111/66 97.8 F (36.6 C) Oral (!) 54 20 97 %  11/16/16 0037 124/75 97.9 F (36.6 C) Oral 77 20 97 %  11/15/16 2000 (!) 149/85 97.5 F (36.4 C) Oral 74 20 96 %  11/15/16 1653 (!) 159/97 - - 88 - 94 %  11/15/16 1651 (!) 175/86 - - 69 - 94 %  11/15/16 1635 121/78 - - 78 20 98 %  11/15/16 1230 114/80 97.6 F (36.4 C) Oral 83 - 94 %  11/15/16 1139 137/81 - - 71 (!) 22 98 %  11/15/16 1124 125/78 - - 81 (!) 28 96 %  11/15/16 1109 (!) 152/85 - - 76 20 99 %  11/15/16 1054 130/86 - - 73 18 97 %  11/15/16 1053 - 97 F (36.1 C) - - - -    @flow {1959:LAST@   Intake/Output from previous day:   06/18 0701 - 06/19 0700 In: 1390 [P.O.:490; I.V.:800] Out: 20    Intake/Output this shift:   No intake/output data recorded.   Intake/Output      06/18 0701 - 06/19 0700 06/19 0701 - 06/20 0700   P.O. 490    I.V. (mL/kg) 800 (6.3)    IV Piggyback 100    Total Intake(mL/kg) 1390 (11)    Blood 20    Total Output 20     Net +1370             LABORATORY DATA:  Recent Labs  11/15/16 1302 11/16/16 0343  WBC 7.7 11.6*  HGB 13.5 11.9*  HCT 42.8 37.9*  PLT 146* 138*    Recent Labs  11/15/16 1302 11/16/16 0343  NA  --  135  K  --  4.6  CL  --  98*  CO2  --  29  BUN  --  33*  CREATININE 1.78* 1.91*  GLUCOSE  --  252*  CALCIUM  --  8.0*   Lab Results  Component Value Date   INR 1.23 11/16/2016   INR 1.36 11/15/2016     Examination:  General appearance: alert, cooperative and no distress Extremities: extremities normal, atraumatic, no cyanosis or edema  Wound Exam: clean, dry, intact   Drainage:  Scant/small amount Bloody exudate  Motor Exam: Quadriceps and Hamstrings Intact  Sensory Exam: Superficial Peroneal, Deep Peroneal and Tibial normal   Assessment:    1 Day Post-Op  Procedure(s) (LRB): TOTAL KNEE ARTHROPLASTY (Left)  ADDITIONAL DIAGNOSIS:  Active Problems:   S/P total knee replacement     Plan: Physical Therapy as ordered Weight Bearing as Tolerated (WBAT)  DVT Prophylaxis:  Coumadin  DISCHARGE PLAN: Home  DISCHARGE NEEDS: HHPT   Patient doing well, probable D/C on Wednesday once cleared by PT  and pharmacy (bridge to coumadin).         Donia Ast 11/16/2016, 7:12 AM

## 2016-11-16 NOTE — Evaluation (Signed)
Occupational Therapy Evaluation Patient Details Name: Dennis Williams MRN: [id number] DOB: [birth year] Today's Date: 11/16/2016    History of Present Illness  Pt is a 83 male s/p L TKA. PMH: CHF, HTN, AFIB. PSH: back surgery.   Clinical Impression   This 83 yo male admitted and underwent above presents to acute OT with deficits below (see OT problem list) thus affecting his PLOF of being fairly independent with some A for LBADLs. He will benefit from one more session of OT to determine the best piece of DME for his tub.    Follow Up Recommendations  No OT follow up;Supervision/Assistance - 24 hour    Equipment Recommendations  None recommended by OT       Precautions / Restrictions Precautions Precautions: Fall;Knee Precaution Booklet Issued: No Precaution Comments: educated on not to put anything under knee Restrictions Weight Bearing Restrictions: Yes LLE Weight Bearing: Weight bearing as tolerated      Mobility Bed Mobility               General bed mobility comments: Pt in chair on arrival  Transfers Overall transfer level: Needs assistance Equipment used: Rolling walker (2 wheeled) Transfers: Sit to/from Stand Sit to Stand: Min assist         General transfer comment: Cues for hand placement to and from seated surface.  Cues for LLE forward.      Balance Overall balance assessment: Needs assistance Sitting-balance support: No upper extremity supported;Feet supported Sitting balance-Leahy Scale: Good     Standing balance support: Single extremity supported;During functional activity Standing balance-Leahy Scale: Poor Standing balance comment: pulling up underwear while standing                           ADL either performed or assessed with clinical judgement   ADL Overall ADL's : Needs assistance/impaired Eating/Feeding: Independent;Sitting   Grooming: Set up;Sitting   Upper Body Bathing: Set up;Sitting   Lower Body Bathing:  Moderate assistance Lower Body Bathing Details (indicate cue type and reason): min A sit<>stand Upper Body Dressing : Set up;Sitting   Lower Body Dressing: Moderate assistance Lower Body Dressing Details (indicate cue type and reason): min A sit<>stand                     Vision Patient Visual Report: No change from baseline              Pertinent Vitals/Pain Pain Assessment: 0-10 Pain Score: 9  Pain Location: left knee Pain Descriptors / Indicators: Aching;Sore;Grimacing Pain Intervention(s): Monitored during session;Premedicated before session;Repositioned;Ice applied     Hand Dominance Right   Extremity/Trunk Assessment Upper Extremity Assessment Upper Extremity Assessment: Overall WFL for tasks assessed           Communication Communication Communication: No difficulties   Cognition Arousal/Alertness: Awake/alert Behavior During Therapy: WFL for tasks assessed/performed Overall Cognitive Status: Within Functional Limits for tasks assessed                                                Home Living Family/patient expects to be discharged to:: Private residence Living Arrangements: Spouse/significant other Available Help at Discharge: Family;Available 24 hours/day Type of Home: House Home Access: Level entry     Home Layout: One level     Bathroom Shower/Tub: Tub/shower unit;Curtain  Bathroom Toilet: Standard     Home Equipment: Environmental consultant - 2 wheels;Bedside commode;Grab bars - tub/shower;Grab bars - toilet;Hand held shower head          Prior Functioning/Environment Level of Independence: Independent                 OT Problem List: Decreased strength;Decreased range of motion;Obesity;Pain      OT Treatment/Interventions: Self-care/ADL training;DME and/or AE instruction;Patient/family education;Balance training    OT Goals(Current goals can be found in the care plan section) Acute Rehab OT Goals Patient Stated  Goal: home soon as able OT Goal Formulation: With patient/family Time For Goal Achievement: 11/23/16 Potential to Achieve Goals: Good  OT Frequency: Min 2X/week              AM-PAC PT "6 Clicks" Daily Activity     Outcome Measure Help from another person eating meals?: None Help from another person taking care of personal grooming?: A Little Help from another person toileting, which includes using toliet, bedpan, or urinal?: A Little Help from another person bathing (including washing, rinsing, drying)?: A Little Help from another person to put on and taking off regular upper body clothing?: A Little Help from another person to put on and taking off regular lower body clothing?: A Lot 6 Click Score: 11   End of Session    Activity Tolerance: Patient tolerated treatment well Patient left: in chair;with call bell/phone within reach;with family/visitor present  OT Visit Diagnosis: Unsteadiness on feet (R26.81);Pain Pain - Right/Left: Left Pain - part of body: Knee                Time: 1001-1022 OT Time Calculation (min): 21 min Charges:  OT General Charges $OT Visit: 1 Procedure G-Codes: OT G-codes **NOT FOR INPATIENT CLASS** Functional Assessment Tool Used: Clinical judgement Functional Limitation: Self care Self Care Current Status (U3845): At least 40 percent but less than 60 percent impaired, limited or restricted Self Care Goal Status (X6468): At least 40 percent but less than 60 percent impaired, limited or restricted   [address], OTR/L [phone] 11/16/2016

## 2016-11-16 NOTE — Care Management Note (Signed)
Case Management Note  Patient Details  Name: Dennis Williams MRN: [id number] Date of Birth: 1934/02/22  Subjective/Objective:    83 yr old gentleman s/p left total knee arthroplasty.                Action/Plan: Patient was preoperatively setup with [facility], no changes. Has RW and 3in1. CPM has been delivered to his home. Patient will have support at discharge.    Expected Discharge Date:    11/17/16              Expected Discharge Plan:  [place]  In-House Referral:     Discharge planning Services  CM Consult  Post Acute Care Choice:  Durable Medical Equipment Choice offered to:  Patient  DME Arranged:  CPM (has RW and 3in1) DME Agency:  [employer]  HH Arranged:  PT [place] Agency:  [facility] (formerly [employer])  Status of Service:  Completed, signed off  If discussed at [employer], dates discussed:    Additional Comments:  Ninfa Meeker, RN 11/16/2016, 3:16 PM

## 2016-11-16 NOTE — Progress Notes (Signed)
Physical Therapy Treatment Patient Details Name: Dennis Williams MRN: [id number] DOB: 28-Sep-1933 Today's Date: 11/16/2016    History of Present Illness  Pt is a 89 male s/p L TKA. PMH: CHF, HTN, AFIB. PSH: back surgery.    PT Comments    Pt performed increased gait with cues for correcting deviations.  Pt fatigues quickly with mobility but remains motivated.  Pt placed in bone foam post tx.     Follow Up Recommendations  Home health PT;Supervision/Assistance - 24 hour     Equipment Recommendations  None recommended by PT    Recommendations for Other Services       Precautions / Restrictions Precautions Precautions: Fall;Knee Precaution Booklet Issued: No Precaution Comments: educated on not to put anything under knee Restrictions Weight Bearing Restrictions: Yes LLE Weight Bearing: Weight bearing as tolerated    Mobility  Bed Mobility Overal bed mobility: Needs Assistance Bed Mobility: Supine to Sit;Sit to Supine     Supine to sit: Supervision Sit to supine: Supervision   General bed mobility comments: Cues for hand placement and LE advancement into and out of bed.    Transfers Overall transfer level: Needs assistance Equipment used: Rolling walker (2 wheeled) Transfers: Sit to/from Stand Sit to Stand: Min assist Stand pivot transfers: Min assist       General transfer comment: Cues for hand placement to and from seated surface.  Cues to push vs. attempting to pull on therapist into standing.    Ambulation/Gait Ambulation/Gait assistance: Min guard Ambulation Distance (Feet): 140 Feet Assistive device: Rolling walker (2 wheeled) Gait Pattern/deviations: Step-to pattern;Antalgic;Trunk flexed;Decreased stride length Gait velocity: slow Gait velocity interpretation: Below normal speed for age/gender General Gait Details: Pt required cues for heel strike and Knee extension on LLE in stance phase.  Pt with standing rest breaks x2 due to fatigue.      Stairs            Wheelchair Mobility    Modified Rankin (Stroke Patients Only)       Balance Overall balance assessment: Needs assistance Sitting-balance support: No upper extremity supported;Feet supported Sitting balance-Leahy Scale: Good     Standing balance support: Single extremity supported;During functional activity Standing balance-Leahy Scale: Poor Standing balance comment: posterior lean with sit to stands.                              Cognition Arousal/Alertness: Awake/alert Behavior During Therapy: WFL for tasks assessed/performed Overall Cognitive Status: Within Functional Limits for tasks assessed                                        Exercises      General Comments        Pertinent Vitals/Pain Pain Assessment: 0-10 Pain Score: 5  Pain Location: left knee Pain Descriptors / Indicators: Aching;Sore;Grimacing Pain Intervention(s): Monitored during session;Repositioned;Ice applied    Home Living                      Prior Function            PT Goals (current goals can now be found in the care plan section) Acute Rehab PT Goals Patient Stated Goal: home soon as able Potential to Achieve Goals: Fair Progress towards PT goals: Progressing toward goals    Frequency    7X/week  PT Plan Current plan remains appropriate    Co-evaluation              AM-PAC PT "6 Clicks" Daily Activity  Outcome Measure  Difficulty turning over in bed (including adjusting bedclothes, sheets and blankets)?: A Lot Difficulty moving from lying on back to sitting on the side of the bed? : A Lot Difficulty sitting down on and standing up from a chair with arms (e.g., wheelchair, bedside commode, etc,.)?: A Lot Help needed moving to and from a bed to chair (including a wheelchair)?: A Little Help needed walking in hospital room?: A Little Help needed climbing 3-5 steps with a railing? : A Little 6 Click  Score: 15    End of Session Equipment Utilized During Treatment: Gait belt Activity Tolerance: Patient tolerated treatment well Patient left: in chair;with call bell/phone within reach;with family/visitor present Nurse Communication: Mobility status PT Visit Diagnosis: Unsteadiness on feet (R26.81);Muscle weakness (generalized) (M62.81);Pain Pain - Right/Left: Left Pain - part of body: Knee     Time: 1411-1439 PT Time Calculation (min) (ACUTE ONLY): 28 min  Charges:  $Gait Training: 8-22 mins $Therapeutic Activity: 8-22 mins                    G Codes:       Dennis Williams, PTA pager [phone]    Cristela Blue 11/16/2016, 2:45 PM

## 2016-11-17 LAB — CBC
HCT: 35.6 % — ABNORMAL LOW (ref 39.0–52.0)
Hemoglobin: 11.2 g/dL — ABNORMAL LOW (ref 13.0–17.0)
MCH: 30.3 pg (ref 26.0–34.0)
MCHC: 31.5 g/dL (ref 30.0–36.0)
MCV: 96.2 fL (ref 78.0–100.0)
Platelets: 139 10*3/uL — ABNORMAL LOW (ref 150–400)
RBC: 3.7 MIL/uL — ABNORMAL LOW (ref 4.22–5.81)
RDW: 14 % (ref 11.5–15.5)
WBC: 10.9 10*3/uL — ABNORMAL HIGH (ref 4.0–10.5)

## 2016-11-17 LAB — GLUCOSE, CAPILLARY
Glucose-Capillary: 281 mg/dL — ABNORMAL HIGH (ref 65–99)
Glucose-Capillary: 293 mg/dL — ABNORMAL HIGH (ref 65–99)

## 2016-11-17 LAB — PROTIME-INR
INR: 1.37
Prothrombin Time: 17 seconds — ABNORMAL HIGH (ref 11.4–15.2)

## 2016-11-17 MED ORDER — ENOXAPARIN SODIUM 30 MG/0.3ML ~~LOC~~ SOLN: 30.0000 mg | SUBCUTANEOUS | 0 refills | Status: DC

## 2016-11-17 MED ORDER — ENOXAPARIN (LOVENOX) PATIENT EDUCATION KIT
PACK | Freq: Once | Status: DC
  Filled 2016-11-17: qty 1

## 2016-11-17 MED ORDER — METHOCARBAMOL 500 MG PO TABS: 500.0000 mg | ORAL_TABLET | Freq: Four times a day (QID) | ORAL | 0 refills | Status: DC | PRN

## 2016-11-17 MED ORDER — OXYCODONE HCL 10 MG PO TABS: 10.0000 mg | ORAL_TABLET | ORAL | 0 refills | Status: DC | PRN

## 2016-11-17 MED ORDER — WARFARIN SODIUM 5 MG PO TABS: 5.0000 mg | ORAL_TABLET | Freq: Once | ORAL | Status: DC

## 2016-11-17 NOTE — Plan of Care (Signed)
Problem: Pain Managment: Goal: General experience of comfort will improve Educated patient on the pain scale and use of pain medication. Patient voices understanding.
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
## 2016-11-17 NOTE — Progress Notes (Signed)
Physical Therapy Treatment Patient Details Name: Dennis Williams MRN: [id number] DOB: 10-18-1933 Today's Date: 11/17/2016    History of Present Illness  Pt is a 72 male s/p L TKA. PMH: CHF, HTN, AFIB. PSH: back surgery.    PT Comments    Pt performed increased gait and reviewed exercises in prep for d/c home today.  Informed RN that patient is ready for d/c home.     Follow Up Recommendations  Home health PT;Supervision/Assistance - 24 hour     Equipment Recommendations  None recommended by PT    Recommendations for Other Services       Precautions / Restrictions Precautions Precautions: Fall;Knee Precaution Booklet Issued: No Precaution Comments: educated on not to put anything under knee Restrictions Weight Bearing Restrictions: Yes LLE Weight Bearing: Weight bearing as tolerated    Mobility  Bed Mobility               General bed mobility comments: Pt sitting in recliner on arrival.    Transfers Overall transfer level: Needs assistance Equipment used: Rolling walker (2 wheeled) Transfers: Sit to/from Stand Sit to Stand: Min assist Stand pivot transfers: Min assist       General transfer comment: Cues for hand placement to and from seated surface.  Pt remains to require min assist to steady in standing due to posterior lean.    Ambulation/Gait Ambulation/Gait assistance: Min guard Ambulation Distance (Feet): 150 Feet Assistive device: Rolling walker (2 wheeled) Gait Pattern/deviations: Step-to pattern;Antalgic;Trunk flexed;Decreased stride length Gait velocity: slow Gait velocity interpretation: Below normal speed for age/gender General Gait Details: Pt required cues for heel strike and Knee extension on LLE in stance phase.  Pt with standing rest breaks x2 due to fatigue.     Stairs            Wheelchair Mobility    Modified Rankin (Stroke Patients Only)       Balance Overall balance assessment: Needs assistance Sitting-balance  support: No upper extremity supported;Feet supported Sitting balance-Leahy Scale: Good       Standing balance-Leahy Scale: Poor Standing balance comment: posterior lean with sit to stands.                              Cognition Arousal/Alertness: Awake/alert Behavior During Therapy: WFL for tasks assessed/performed Overall Cognitive Status: Within Functional Limits for tasks assessed                                 General Comments: Oriented x3. Pt perseverating on certain topics.       Exercises Total Joint Exercises Ankle Circles/Pumps: AROM;Left;10 reps;Supine Quad Sets: AROM;Left;10 reps;Supine Towel Squeeze: AROM;Both;10 reps;Supine Short Arc Quad: AROM;Left;10 reps;Supine Heel Slides: AAROM;Left;10 reps;Supine Hip ABduction/ADduction: Left;10 reps;AAROM;Supine Straight Leg Raises: AAROM;Left;10 reps;Supine Long Arc Quad: AROM;Left;10 reps;Seated Goniometric ROM: 85 degrees flexion in L knee    General Comments        Pertinent Vitals/Pain Pain Assessment: Faces Pain Score: 5  Faces Pain Scale: Hurts even more Pain Location: left knee Pain Descriptors / Indicators: Aching;Sore;Grimacing Pain Intervention(s): Monitored during session;Repositioned    Home Living                      Prior Function            PT Goals (current goals can now be found in the care plan  section) Acute Rehab PT Goals Patient Stated Goal: home soon as able Potential to Achieve Goals: Fair Progress towards PT goals: Progressing toward goals    Frequency    7X/week      PT Plan Current plan remains appropriate    Co-evaluation              AM-PAC PT "6 Clicks" Daily Activity  Outcome Measure  Difficulty turning over in bed (including adjusting bedclothes, sheets and blankets)?: A Lot Difficulty moving from lying on back to sitting on the side of the bed? : A Lot Difficulty sitting down on and standing up from a chair with arms  (e.g., wheelchair, bedside commode, etc,.)?: A Lot Help needed moving to and from a bed to chair (including a wheelchair)?: A Little Help needed walking in hospital room?: A Little Help needed climbing 3-5 steps with a railing? : A Little 6 Click Score: 15    End of Session Equipment Utilized During Treatment: Gait belt Activity Tolerance: Patient tolerated treatment well Patient left: in chair;with call bell/phone within reach;with family/visitor present Nurse Communication: Mobility status PT Visit Diagnosis: Unsteadiness on feet (R26.81);Muscle weakness (generalized) (M62.81);Pain Pain - Right/Left: Left Pain - part of body: Knee     Time: 6979-4801 PT Time Calculation (min) (ACUTE ONLY): 32 min  Charges:  $Gait Training: 8-22 mins $Therapeutic Exercise: 8-22 mins                    G Codes:       Governor Rooks, PTA pager [phone]    Cristela Blue 11/17/2016, 2:59 PM

## 2016-11-17 NOTE — Progress Notes (Signed)
Occupational Therapy Treatment Patient Details Name: Dennis Williams MRN: [id number] DOB: 01-Nov-1933 Today's Date: 11/17/2016    History of present illness  Pt is a 27 male s/p L TKA. PMH: CHF, HTN, AFIB. PSH: back surgery.   OT comments  Pt progressing towards established goals. Pt performed LB ADLs with wife's assistance and Min A for sit<>stand transfers. Educated pt and wife on tub transfer using tub bench with Min A for sit<>stand and Min VCs for sequencing and safety. Provided all education and answered family and pt questions. All acute OT needs met. Will sign off.    Follow Up Recommendations  No OT follow up;Supervision/Assistance - 24 hour    Equipment Recommendations  None recommended by OT    Recommendations for Other Services      Precautions / Restrictions Precautions Precautions: Fall;Knee Precaution Booklet Issued: No Precaution Comments: educated on not to put anything under knee Restrictions Weight Bearing Restrictions: Yes LLE Weight Bearing: Weight bearing as tolerated       Mobility Bed Mobility Overal bed mobility: Needs Assistance Bed Mobility: Supine to Sit;Sit to Supine     Supine to sit: HOB elevated;Min assist     General bed mobility comments: Min A to manage LLE  Transfers Overall transfer level: Needs assistance Equipment used: Rolling walker (2 wheeled) Transfers: Sit to/from Stand Sit to Stand: Min assist         General transfer comment: Cues for hand placement to and from seated surface.  Cues to push up and bring UB forward. Min A for balance    Balance Overall balance assessment: Needs assistance Sitting-balance support: No upper extremity supported;Feet supported Sitting balance-Leahy Scale: Good     Standing balance support: Single extremity supported;During functional activity Standing balance-Leahy Scale: Poor Standing balance comment: posterior lean with sit to stands.                             ADL  either performed or assessed with clinical judgement   ADL Overall ADL's : Needs assistance/impaired                 Upper Body Dressing : Set up;Sitting Upper Body Dressing Details (indicate cue type and reason): Donned new gown Lower Body Dressing: With caregiver independent assisting;Sit to/from stand Lower Body Dressing Details (indicate cue type and reason): min A sit<>stand. Wife assisting with LB dressing. Reviewed LB dressing technique - pt verbalized understanding of L leg first         Tub/ Shower Transfer: Minimal assistance;Rolling walker;Tub bench;Cueing for sequencing;Ambulation Tub/Shower Transfer Details (indicate cue type and reason): Discussed different DME for tub transfer. Educated pt on use of tub bench and ways to purchase. Family planning to purchase independently. Min A for sit<>stand Functional mobility during ADLs: Minimal assistance;Rolling walker General ADL Comments: Pt with limited activity tolerance and requires increased VCs for encouragement.  Provided all education in preparation for dc home.      Vision       Perception     Praxis      Cognition Arousal/Alertness: Awake/alert Behavior During Therapy: WFL for tasks assessed/performed Overall Cognitive Status: Within Functional Limits for tasks assessed                                 General Comments: Oriented x3. Pt perseverating on certain topics.  Exercises     Shoulder Instructions       General Comments Wife and daughter present    Pertinent Vitals/ Pain       Pain Assessment: Faces Faces Pain Scale: Hurts even more Pain Location: left knee Pain Descriptors / Indicators: Aching;Sore;Grimacing Pain Intervention(s): Monitored during session;Limited activity within patient's tolerance;Repositioned  Home Living                                          Prior Functioning/Environment              Frequency  Min 2X/week         Progress Toward Goals  OT Goals(current goals can now be found in the care plan section)  Progress towards OT goals: Progressing toward goals  Acute Rehab OT Goals Patient Stated Goal: home soon as able OT Goal Formulation: With patient/family Time For Goal Achievement: 11/23/16 Potential to Achieve Goals: Good ADL Goals Pt Will Perform Tub/Shower Transfer: Tub transfer;tub bench;ambulating;rolling walker;with min guard assist  Plan Discharge plan remains appropriate    Co-evaluation                 AM-PAC PT "6 Clicks" Daily Activity     Outcome Measure   Help from another person eating meals?: None Help from another person taking care of personal grooming?: A Little Help from another person toileting, which includes using toliet, bedpan, or urinal?: A Little Help from another person bathing (including washing, rinsing, drying)?: A Little Help from another person to put on and taking off regular upper body clothing?: A Little Help from another person to put on and taking off regular lower body clothing?: A Lot 6 Click Score: 18    End of Session Equipment Utilized During Treatment: Gait belt;Rolling walker  OT Visit Diagnosis: Unsteadiness on feet (R26.81);Pain Pain - Right/Left: Left Pain - part of body: Knee   Activity Tolerance Patient tolerated treatment well   Patient Left in chair;with call bell/phone within reach;with family/visitor present   Nurse Communication Mobility status;Weight bearing status;Other (comment) (Blood stain at L knee )        Time: 2103-1281 OT Time Calculation (min): 48 min  Charges: OT General Charges $OT Visit: 1 Procedure OT Treatments $Self Care/Home Management : 38-52 mins  [place], OTR/L Acute Rehab Pager: [phone] Office: [place] 11/17/2016, 10:33 AM

## 2016-11-17 NOTE — Progress Notes (Signed)
ANTICOAGULATION CONSULT NOTE - FOLLOW UP  Pharmacy Consult:  Coumadin Indication:  History of AFib + VTE px s/p TKA  Allergies  Allergen Reactions  . No Known Allergies    Patient Measurements: Height: 5\' 8"  (172.7 cm) Weight: 279 lb (126.6 kg) IBW/kg (Calculated) : 68.4  Vital Signs:    Labs:  Recent Labs  11/15/16 0713  11/15/16 1302 11/16/16 0343 11/17/16 0522  HGB  --   < > 13.5 11.9* 11.2*  HCT  --   --  42.8 37.9* 35.6*  PLT  --   --  146* 138* 139*  LABPROT 16.9*  --   --  15.6* 17.0*  INR 1.36  --   --  1.23 1.37  CREATININE  --   --  1.78* 1.91*  --   < > = values in this interval not displayed.  Estimated Creatinine Clearance: 40 mL/min (A) (by C-G formula based on SCr of 1.91 mg/dL (H)).  Assessment: 69 YOM with history of Afib on Coumadin PTA.  Patient underwent left TKA on 11/14/16 and Pharmacy consulted to resume Coumadin.  Prophylactic Lovenox also started post-op.  Patient's INR is sub-therapeutic at 1.37.  Platelet count is low; however, no bleeding reported.  Home Coumadin dose: 3.5mg  alternating with 4mg  daily   Goal of Therapy:  INR 2-3 Monitor platelets by anticoagulation protocol: Yes    Plan:  - Repeat Coumadin 5mg  PO today - Lovenox 30mg  SQ Q24H per MD.  D/C when INR >/= 1.8 per order, but will likely need to continue until INR >/= 2. - Daily PT / INR  Rober Minion, PharmD., MS Clinical Pharmacist Pager:  [phone] Thank you for allowing pharmacy to be part of this patients care team. 11/17/2016, 11:51 AM

## 2016-11-17 NOTE — Progress Notes (Signed)
SPORTS MEDICINE AND JOINT REPLACEMENT  Lara Mulch, MD    [name], PA-C [address], [place], [place]  [id number]                             [phone]   PROGRESS NOTE  Subjective:  negative for Chest Pain  negative for Shortness of Breath  negative for Nausea/Vomiting   negative for Calf Pain  negative for Bowel Movement   Tolerating Diet: yes         Patient reports pain as 4 on 0-10 scale.    Objective: Vital signs in last 24 hours:   Patient Vitals for the past 24 hrs:  BP Temp Temp src Pulse Resp SpO2  11/16/16 2215 124/61 98.6 F (37 C) Oral 67 18 93 %    @flow {1959:LAST@   Intake/Output from previous day:   06/19 0701 - 06/20 0700 In: 960 [P.O.:960] Out: 1000 [Urine:1000]   Intake/Output this shift:   06/20 0701 - 06/20 1900 In: 240 [P.O.:240] Out: -    Intake/Output      06/19 0701 - 06/20 0700 06/20 0701 - 06/21 0700   P.O. 960 240   I.V. (mL/kg)     IV Piggyback     Total Intake(mL/kg) 960 (7.6) 240 (1.9)   Urine (mL/kg/hr) 1000 (0.3)    Blood     Total Output 1000     Net -40 +240        Urine Occurrence 1 x       LABORATORY DATA:  Recent Labs  11/15/16 1302 11/16/16 0343 11/17/16 0522  WBC 7.7 11.6* 10.9*  HGB 13.5 11.9* 11.2*  HCT 42.8 37.9* 35.6*  PLT 146* 138* 139*    Recent Labs  11/15/16 1302 11/16/16 0343  NA  --  135  K  --  4.6  CL  --  98*  CO2  --  29  BUN  --  33*  CREATININE 1.78* 1.91*  GLUCOSE  --  252*  CALCIUM  --  8.0*   Lab Results  Component Value Date   INR 1.37 11/17/2016   INR 1.23 11/16/2016   INR 1.36 11/15/2016    Examination:  General appearance: alert, cooperative and no distress Extremities: extremities normal, atraumatic, no cyanosis or edema  Wound Exam: clean, dry, intact   Drainage:  None: wound tissue dry  Motor Exam: Quadriceps and Hamstrings Intact  Sensory Exam: Superficial Peroneal, Deep Peroneal and Tibial normal   Assessment:    2 Days Post-Op   Procedure(s) (LRB): TOTAL KNEE ARTHROPLASTY (Left)  ADDITIONAL DIAGNOSIS:  Active Problems:   S/P total knee replacement     Plan: Physical Therapy as ordered Weight Bearing as Tolerated (WBAT)  DVT Prophylaxis:  Coumadin  DISCHARGE PLAN: Home  DISCHARGE NEEDS: HHPT   Patient doing well, ready for D/C home today. Will follow up with PCP on Friday for INR check         Donia Ast 11/17/2016, 1:15 PM

## 2016-11-17 NOTE — Progress Notes (Signed)
Inpatient Diabetes Program Recommendations  AACE/ADA: New Consensus Statement on Inpatient Glycemic Control (2015)  Target Ranges:  Prepandial:   less than 140 mg/dL      Peak postprandial:   less than 180 mg/dL (1-2 hours)      Critically ill patients:  140 - 180 mg/dL   Lab Results  Component Value Date   GLUCAP 281 (H) 11/17/2016   HGBA1C 7.8 (H) 11/15/2016    Review of Glycemic Control Results for Dennis Williams, Dennis Williams (MRN [id number]) as of 11/17/2016 11:09  Ref. Range 11/15/2016 16:54 11/15/2016 21:45 11/16/2016 12:31 11/16/2016 16:04 11/17/2016 10:37  Glucose-Capillary Latest Ref Range: 65 - 99 mg/dL 269 (H) 241 (H) 232 (H) 277 (H) 281 (H)   Inpatient Diabetes Program Recommendations:    CBGs hyperglycemia.  Please consider: Novolog correction 0-9 units tid.  Thank you, Nani Gasser. Janisse Ghan, RN, MSN, CDE  Diabetes Coordinator Inpatient Glycemic Control Team Team Pager [phone] (8am-5pm) 11/17/2016 11:09 AM

## 2016-11-18 DIAGNOSIS — Z794 Long term (current) use of insulin: Secondary | ICD-10-CM | POA: Diagnosis not present

## 2016-11-18 DIAGNOSIS — L03116 Cellulitis of left lower limb: Secondary | ICD-10-CM | POA: Diagnosis not present

## 2016-11-18 DIAGNOSIS — I429 Cardiomyopathy, unspecified: Secondary | ICD-10-CM | POA: Diagnosis not present

## 2016-11-18 DIAGNOSIS — I509 Heart failure, unspecified: Secondary | ICD-10-CM | POA: Diagnosis not present

## 2016-11-18 DIAGNOSIS — K449 Diaphragmatic hernia without obstruction or gangrene: Secondary | ICD-10-CM | POA: Diagnosis not present

## 2016-11-18 DIAGNOSIS — Z471 Aftercare following joint replacement surgery: Secondary | ICD-10-CM | POA: Diagnosis not present

## 2016-11-18 DIAGNOSIS — E1151 Type 2 diabetes mellitus with diabetic peripheral angiopathy without gangrene: Secondary | ICD-10-CM | POA: Diagnosis not present

## 2016-11-18 DIAGNOSIS — Z7901 Long term (current) use of anticoagulants: Secondary | ICD-10-CM | POA: Diagnosis not present

## 2016-11-18 DIAGNOSIS — Z96652 Presence of left artificial knee joint: Secondary | ICD-10-CM | POA: Diagnosis not present

## 2016-11-18 DIAGNOSIS — I110 Hypertensive heart disease with heart failure: Secondary | ICD-10-CM | POA: Diagnosis not present

## 2016-11-18 DIAGNOSIS — Z87891 Personal history of nicotine dependence: Secondary | ICD-10-CM | POA: Diagnosis not present

## 2016-11-18 DIAGNOSIS — I4891 Unspecified atrial fibrillation: Secondary | ICD-10-CM | POA: Diagnosis not present

## 2016-11-18 DIAGNOSIS — Z5181 Encounter for therapeutic drug level monitoring: Secondary | ICD-10-CM | POA: Diagnosis not present

## 2016-11-19 DIAGNOSIS — I509 Heart failure, unspecified: Secondary | ICD-10-CM | POA: Diagnosis not present

## 2016-11-19 DIAGNOSIS — I4891 Unspecified atrial fibrillation: Secondary | ICD-10-CM | POA: Diagnosis not present

## 2016-11-19 DIAGNOSIS — E1151 Type 2 diabetes mellitus with diabetic peripheral angiopathy without gangrene: Secondary | ICD-10-CM | POA: Diagnosis not present

## 2016-11-19 DIAGNOSIS — G4733 Obstructive sleep apnea (adult) (pediatric): Secondary | ICD-10-CM | POA: Diagnosis not present

## 2016-11-19 DIAGNOSIS — L03116 Cellulitis of left lower limb: Secondary | ICD-10-CM | POA: Diagnosis not present

## 2016-11-19 DIAGNOSIS — I429 Cardiomyopathy, unspecified: Secondary | ICD-10-CM | POA: Diagnosis not present

## 2016-11-19 DIAGNOSIS — Z96652 Presence of left artificial knee joint: Secondary | ICD-10-CM | POA: Diagnosis not present

## 2016-11-19 DIAGNOSIS — Z7901 Long term (current) use of anticoagulants: Secondary | ICD-10-CM | POA: Diagnosis not present

## 2016-11-19 DIAGNOSIS — Z87891 Personal history of nicotine dependence: Secondary | ICD-10-CM | POA: Diagnosis not present

## 2016-11-19 DIAGNOSIS — Z471 Aftercare following joint replacement surgery: Secondary | ICD-10-CM | POA: Diagnosis not present

## 2016-11-19 DIAGNOSIS — Z794 Long term (current) use of insulin: Secondary | ICD-10-CM | POA: Diagnosis not present

## 2016-11-19 DIAGNOSIS — Z5181 Encounter for therapeutic drug level monitoring: Secondary | ICD-10-CM | POA: Diagnosis not present

## 2016-11-19 DIAGNOSIS — I110 Hypertensive heart disease with heart failure: Secondary | ICD-10-CM | POA: Diagnosis not present

## 2016-11-19 DIAGNOSIS — K449 Diaphragmatic hernia without obstruction or gangrene: Secondary | ICD-10-CM | POA: Diagnosis not present

## 2016-11-19 NOTE — Op Note (Signed)
TOTAL KNEE REPLACEMENT OPERATIVE NOTE:  11/15/2016  8:38 AM  PATIENT:  Dennis Williams  83 y.o. male  PRE-OPERATIVE DIAGNOSIS:  primary osteoarthritis left knee  POST-OPERATIVE DIAGNOSIS:  primary osterarthritis left knee  PROCEDURE:  Procedure(s): TOTAL KNEE ARTHROPLASTY  SURGEON:  Surgeon(s): Vickey Huger, MD  PHYSICIAN ASSISTANT:Blair Mancel Bale, PAC}  ANESTHESIA:   spinal  DRAINS: Hemovac  SPECIMEN: None  COUNTS:  Correct  TOURNIQUET:   Total Tourniquet Time Documented: Thigh (Left) - 52 minutes Total: Thigh (Left) - 52 minutes   DICTATION:  Indication for procedure:    The patient is a 83 y.o. male who has failed conservative treatment for primary osteoarthritis left knee.  Informed consent was obtained prior to anesthesia. The risks versus benefits of the operation were explain and in a way the patient can, and did, understand.   On the implant demand matching protocol, this patient scored 9.  Therefore, this patient was not receive a polyethylene insert with vitamin E which is a high demand implant.  Description of procedure:     The patient was taken to the operating room and placed under anesthesia.  The patient was positioned in the usual fashion taking care that all body parts were adequately padded and/or protected.  I foley catheter was not placed.  A tourniquet was applied and the leg prepped and draped in the usual sterile fashion.  The extremity was exsanguinated with the esmarch and tourniquet inflated to 350 mmHg.  Pre-operative range of motion was normal.  The knee was in 5 degree of mild varus.  A midline incision approximately 6-7 inches long was made with a #10 blade.  A new blade was used to make a parapatellar arthrotomy going 2-3 cm into the quadriceps tendon, over the patella, and alongside the medial aspect of the patellar tendon.  A synovectomy was then performed with the #10 blade and forceps. I then elevated the deep MCL off the medial tibial  metaphysis subperiosteally around to the semimembranosus attachment.    I everted the patella and used calipers to measure patellar thickness.  I used the reamer to ream down to appropriate thickness to recreate the native thickness.  I then removed excess bone with the rongeur and sagittal saw.  I used the appropriately sized template and drilled the three lug holes.  I then put the trial in place and measured the thickness with the calipers to ensure recreation of the native thickness.  The trial was then removed and the patella subluxed and the knee brought into flexion.  A homan retractor was place to retract and protect the patella and lateral structures.  A Z-retractor was place medially to protect the medial structures.  The extra-medullary alignment system was used to make cut the tibial articular surface perpendicular to the anamotic axis of the tibia and in 3 degrees of posterior slope.  The cut surface and alignment jig was removed.  I then used the intramedullary alignment guide to make a 6 valgus cut on the distal femur.  I then marked out the epicondylar axis on the distal femur.  The posterior condylar axis measured 3 degrees.  I then used the anterior referencing sizer and measured the femur to be a size 9.  The 4-In-1 cutting block was screwed into place in external rotation matching the posterior condylar angle, making our cuts perpendicular to the epicondylar axis.  Anterior, posterior and chamfer cuts were made with the sagittal saw.  The cutting block and cut pieces were removed.  A lamina spreader was placed in 90 degrees of flexion.  The ACL, PCL, menisci, and posterior condylar osteophytes were removed.  A 11 mm spacer blocked was found to offer good flexion and extension gap balance after mild in degree releasing.   The scoop retractor was then placed and the femoral finishing block was pinned in place.  The small sagittal saw was used as well as the lug drill to finish the femur.   The block and cut surfaces were removed and the medullary canal hole filled with autograft bone from the cut pieces.  The tibia was delivered forward in deep flexion and external rotation.  A size F tray was selected and pinned into place centered on the medial 1/3 of the tibial tubercle.  The reamer and keel was used to prepare the tibia through the tray.    I then trialed with the size 9 femur, size F tibia, a 11 mm insert and the 38 patella.  I had excellent flexion/extension gap balance, excellent patella tracking.  Flexion was full and beyond 120 degrees; extension was zero.  These components were chosen and the staff opened them to me on the back table while the knee was lavaged copiously and the cement mixed.  The soft tissue was infiltrated with 60cc of exparel 1.3% through a 21 gauge needle.  I cemented in the components and removed all excess cement.  The polyethylene tibial component was snapped into place and the knee placed in extension while cement was hardening.  The capsule was infilltrated with 30cc of .25% Marcaine with epinephrine.  A hemovac was place in the joint exiting superolaterally.  A pain pump was place superomedially superficial to the arthrotomy.  Once the cement was hard, the tourniquet was let down.  Hemostasis was obtained.  The arthrotomy was closed with figure-8 #1 vicryl sutures.  The deep soft tissues were closed with #0 vicryls and the subcuticular layer closed with a running #2-0 vicryl.  The skin was reapproximated and closed with skin staples.  The wound was dressed with xeroform, 4 x4's, 2 ABD sponges, a single layer of webril and a TED stocking.   The patient was then awakened, extubated, and taken to the recovery room in stable condition.  BLOOD LOSS:  300cc DRAINS: 1 hemovac, 1 pain catheter COMPLICATIONS:  None.  PLAN OF CARE: Admit to inpatient   PATIENT DISPOSITION:  PACU - hemodynamically stable.   Delay start of Pharmacological VTE agent (>24hrs)  due to surgical blood loss or risk of bleeding:  not applicable  Please fax a copy of this op note to my office at [phone] (please only include page 1 and 2 of the Case Information op note)

## 2016-11-20 DIAGNOSIS — Z5181 Encounter for therapeutic drug level monitoring: Secondary | ICD-10-CM | POA: Diagnosis not present

## 2016-11-20 DIAGNOSIS — Z87891 Personal history of nicotine dependence: Secondary | ICD-10-CM | POA: Diagnosis not present

## 2016-11-20 DIAGNOSIS — Z7901 Long term (current) use of anticoagulants: Secondary | ICD-10-CM | POA: Diagnosis not present

## 2016-11-20 DIAGNOSIS — I509 Heart failure, unspecified: Secondary | ICD-10-CM | POA: Diagnosis not present

## 2016-11-20 DIAGNOSIS — I429 Cardiomyopathy, unspecified: Secondary | ICD-10-CM | POA: Diagnosis not present

## 2016-11-20 DIAGNOSIS — Z471 Aftercare following joint replacement surgery: Secondary | ICD-10-CM | POA: Diagnosis not present

## 2016-11-20 DIAGNOSIS — E1151 Type 2 diabetes mellitus with diabetic peripheral angiopathy without gangrene: Secondary | ICD-10-CM | POA: Diagnosis not present

## 2016-11-20 DIAGNOSIS — K449 Diaphragmatic hernia without obstruction or gangrene: Secondary | ICD-10-CM | POA: Diagnosis not present

## 2016-11-20 DIAGNOSIS — L03116 Cellulitis of left lower limb: Secondary | ICD-10-CM | POA: Diagnosis not present

## 2016-11-20 DIAGNOSIS — Z794 Long term (current) use of insulin: Secondary | ICD-10-CM | POA: Diagnosis not present

## 2016-11-20 DIAGNOSIS — Z96652 Presence of left artificial knee joint: Secondary | ICD-10-CM | POA: Diagnosis not present

## 2016-11-20 DIAGNOSIS — I110 Hypertensive heart disease with heart failure: Secondary | ICD-10-CM | POA: Diagnosis not present

## 2016-11-20 DIAGNOSIS — I4891 Unspecified atrial fibrillation: Secondary | ICD-10-CM | POA: Diagnosis not present

## 2016-11-21 ENCOUNTER — Emergency Department (HOSPITAL_COMMUNITY)
Admit: 2016-11-21 | Discharge: 2016-11-21 | Disposition: A | Discharge status: Home / Self Care | Payer: Medicare Other | Attending: Emergency Medicine | Admitting: Emergency Medicine

## 2016-11-21 ENCOUNTER — Encounter (HOSPITAL_COMMUNITY): Payer: Self-pay

## 2016-11-21 ENCOUNTER — Inpatient Hospital Stay (HOSPITAL_COMMUNITY)
Admission: EM | Admit: 2016-11-21 | Discharge: 2016-11-26 | DRG: 862 | Disposition: A | Discharge status: Home / Self Care | Payer: Medicare Other | Attending: Internal Medicine | Admitting: Internal Medicine

## 2016-11-21 ENCOUNTER — Inpatient Hospital Stay (HOSPITAL_COMMUNITY): Payer: Medicare Other

## 2016-11-21 DIAGNOSIS — I5022 Chronic systolic (congestive) heart failure: Secondary | ICD-10-CM | POA: Diagnosis present

## 2016-11-21 DIAGNOSIS — Z96653 Presence of artificial knee joint, bilateral: Secondary | ICD-10-CM | POA: Diagnosis present

## 2016-11-21 DIAGNOSIS — I428 Other cardiomyopathies: Secondary | ICD-10-CM | POA: Diagnosis present

## 2016-11-21 DIAGNOSIS — L0390 Cellulitis, unspecified: Secondary | ICD-10-CM | POA: Diagnosis present

## 2016-11-21 DIAGNOSIS — J9601 Acute respiratory failure with hypoxia: Secondary | ICD-10-CM | POA: Diagnosis present

## 2016-11-21 DIAGNOSIS — Z7901 Long term (current) use of anticoagulants: Secondary | ICD-10-CM | POA: Diagnosis not present

## 2016-11-21 DIAGNOSIS — M7989 Other specified soft tissue disorders: Secondary | ICD-10-CM | POA: Diagnosis not present

## 2016-11-21 DIAGNOSIS — T814XXA Infection following a procedure, initial encounter: Principal | ICD-10-CM | POA: Diagnosis present

## 2016-11-21 DIAGNOSIS — G4733 Obstructive sleep apnea (adult) (pediatric): Secondary | ICD-10-CM | POA: Diagnosis not present

## 2016-11-21 DIAGNOSIS — IMO0001 Reserved for inherently not codable concepts without codable children: Secondary | ICD-10-CM

## 2016-11-21 DIAGNOSIS — E0800 Diabetes mellitus due to underlying condition with hyperosmolarity without nonketotic hyperglycemic-hyperosmolar coma (NKHHC): Secondary | ICD-10-CM | POA: Diagnosis not present

## 2016-11-21 DIAGNOSIS — N182 Chronic kidney disease, stage 2 (mild): Secondary | ICD-10-CM | POA: Diagnosis present

## 2016-11-21 DIAGNOSIS — N289 Disorder of kidney and ureter, unspecified: Secondary | ICD-10-CM | POA: Diagnosis not present

## 2016-11-21 DIAGNOSIS — R609 Edema, unspecified: Secondary | ICD-10-CM

## 2016-11-21 DIAGNOSIS — Z87891 Personal history of nicotine dependence: Secondary | ICD-10-CM | POA: Diagnosis not present

## 2016-11-21 DIAGNOSIS — I130 Hypertensive heart and chronic kidney disease with heart failure and stage 1 through stage 4 chronic kidney disease, or unspecified chronic kidney disease: Secondary | ICD-10-CM | POA: Diagnosis not present

## 2016-11-21 DIAGNOSIS — G4730 Sleep apnea, unspecified: Secondary | ICD-10-CM | POA: Diagnosis not present

## 2016-11-21 DIAGNOSIS — E785 Hyperlipidemia, unspecified: Secondary | ICD-10-CM | POA: Diagnosis not present

## 2016-11-21 DIAGNOSIS — I272 Pulmonary hypertension, unspecified: Secondary | ICD-10-CM | POA: Diagnosis not present

## 2016-11-21 DIAGNOSIS — I5021 Acute systolic (congestive) heart failure: Secondary | ICD-10-CM | POA: Diagnosis not present

## 2016-11-21 DIAGNOSIS — I5023 Acute on chronic systolic (congestive) heart failure: Secondary | ICD-10-CM | POA: Diagnosis present

## 2016-11-21 DIAGNOSIS — R0602 Shortness of breath: Secondary | ICD-10-CM | POA: Diagnosis not present

## 2016-11-21 DIAGNOSIS — Z6841 Body Mass Index (BMI) 40.0 and over, adult: Secondary | ICD-10-CM

## 2016-11-21 DIAGNOSIS — E669 Obesity, unspecified: Secondary | ICD-10-CM | POA: Diagnosis present

## 2016-11-21 DIAGNOSIS — Z794 Long term (current) use of insulin: Secondary | ICD-10-CM

## 2016-11-21 DIAGNOSIS — E1122 Type 2 diabetes mellitus with diabetic chronic kidney disease: Secondary | ICD-10-CM | POA: Diagnosis not present

## 2016-11-21 DIAGNOSIS — I5020 Unspecified systolic (congestive) heart failure: Secondary | ICD-10-CM | POA: Diagnosis not present

## 2016-11-21 DIAGNOSIS — L03116 Cellulitis of left lower limb: Secondary | ICD-10-CM

## 2016-11-21 DIAGNOSIS — M79609 Pain in unspecified limb: Secondary | ICD-10-CM | POA: Diagnosis not present

## 2016-11-21 DIAGNOSIS — E119 Type 2 diabetes mellitus without complications: Secondary | ICD-10-CM

## 2016-11-21 DIAGNOSIS — Z79899 Other long term (current) drug therapy: Secondary | ICD-10-CM

## 2016-11-21 DIAGNOSIS — Z96659 Presence of unspecified artificial knee joint: Secondary | ICD-10-CM

## 2016-11-21 DIAGNOSIS — I5032 Chronic diastolic (congestive) heart failure: Secondary | ICD-10-CM

## 2016-11-21 DIAGNOSIS — R0902 Hypoxemia: Secondary | ICD-10-CM

## 2016-11-21 DIAGNOSIS — K5900 Constipation, unspecified: Secondary | ICD-10-CM | POA: Diagnosis not present

## 2016-11-21 DIAGNOSIS — I481 Persistent atrial fibrillation: Secondary | ICD-10-CM | POA: Diagnosis not present

## 2016-11-21 DIAGNOSIS — R079 Chest pain, unspecified: Secondary | ICD-10-CM | POA: Diagnosis not present

## 2016-11-21 DIAGNOSIS — I4891 Unspecified atrial fibrillation: Secondary | ICD-10-CM | POA: Diagnosis not present

## 2016-11-21 DIAGNOSIS — I10 Essential (primary) hypertension: Secondary | ICD-10-CM | POA: Diagnosis present

## 2016-11-21 DIAGNOSIS — M79605 Pain in left leg: Secondary | ICD-10-CM

## 2016-11-21 DIAGNOSIS — I509 Heart failure, unspecified: Secondary | ICD-10-CM

## 2016-11-21 DIAGNOSIS — Z9981 Dependence on supplemental oxygen: Secondary | ICD-10-CM

## 2016-11-21 LAB — CBC WITH DIFFERENTIAL/PLATELET
Basophils Absolute: 0 10*3/uL (ref 0.0–0.1)
Basophils Relative: 0 %
Eosinophils Absolute: 0.1 10*3/uL (ref 0.0–0.7)
Eosinophils Relative: 1 %
HCT: 32.2 % — ABNORMAL LOW (ref 39.0–52.0)
Hemoglobin: 10.4 g/dL — ABNORMAL LOW (ref 13.0–17.0)
Lymphocytes Relative: 16 %
Lymphs Abs: 1.7 10*3/uL (ref 0.7–4.0)
MCH: 30.3 pg (ref 26.0–34.0)
MCHC: 32.3 g/dL (ref 30.0–36.0)
MCV: 93.9 fL (ref 78.0–100.0)
Monocytes Absolute: 1.2 10*3/uL — ABNORMAL HIGH (ref 0.1–1.0)
Monocytes Relative: 11 %
Neutro Abs: 7.3 10*3/uL (ref 1.7–7.7)
Neutrophils Relative %: 72 %
Platelets: 206 10*3/uL (ref 150–400)
RBC: 3.43 MIL/uL — ABNORMAL LOW (ref 4.22–5.81)
RDW: 13.7 % (ref 11.5–15.5)
WBC: 10.3 10*3/uL (ref 4.0–10.5)

## 2016-11-21 LAB — PROTIME-INR
INR: 1.44
Prothrombin Time: 17.7 seconds — ABNORMAL HIGH (ref 11.4–15.2)

## 2016-11-21 LAB — COMPREHENSIVE METABOLIC PANEL
ALT: 40 U/L (ref 17–63)
AST: 33 U/L (ref 15–41)
Albumin: 2.9 g/dL — ABNORMAL LOW (ref 3.5–5.0)
Alkaline Phosphatase: 80 U/L (ref 38–126)
Anion gap: 10 (ref 5–15)
BUN: 48 mg/dL — ABNORMAL HIGH (ref 6–20)
CO2: 27 mmol/L (ref 22–32)
Calcium: 8.3 mg/dL — ABNORMAL LOW (ref 8.9–10.3)
Chloride: 95 mmol/L — ABNORMAL LOW (ref 101–111)
Creatinine, Ser: 1.98 mg/dL — ABNORMAL HIGH (ref 0.61–1.24)
GFR calc Af Amer: 35 mL/min — ABNORMAL LOW (ref >60)
GFR calc non Af Amer: 30 mL/min — ABNORMAL LOW (ref >60)
Glucose, Bld: 203 mg/dL — ABNORMAL HIGH (ref 65–99)
Potassium: 4.4 mmol/L (ref 3.5–5.1)
Sodium: 132 mmol/L — ABNORMAL LOW (ref 135–145)
Total Bilirubin: 1.1 mg/dL (ref 0.3–1.2)
Total Protein: 6.3 g/dL — ABNORMAL LOW (ref 6.5–8.1)

## 2016-11-21 LAB — URINALYSIS, ROUTINE W REFLEX MICROSCOPIC
Bacteria, UA: NONE SEEN
Bilirubin Urine: NEGATIVE
Glucose, UA: >=500 mg/dL — AB
Ketones, ur: NEGATIVE mg/dL
Leukocytes, UA: NEGATIVE
Nitrite: NEGATIVE
Protein, ur: 30 mg/dL — AB
Specific Gravity, Urine: 1.009 (ref 1.005–1.030)
Squamous Epithelial / LPF: NONE SEEN
pH: 5 (ref 5.0–8.0)

## 2016-11-21 LAB — I-STAT CG4 LACTIC ACID, ED: Lactic Acid, Venous: 1.03 mmol/L (ref 0.5–1.9)

## 2016-11-21 LAB — CBG MONITORING, ED: Glucose-Capillary: 189 mg/dL — ABNORMAL HIGH (ref 65–99)

## 2016-11-21 LAB — GLUCOSE, CAPILLARY: Glucose-Capillary: 328 mg/dL — ABNORMAL HIGH (ref 65–99)

## 2016-11-21 MED ORDER — VITAMIN D 1000 UNITS PO TABS
2000.0000 [IU] | ORAL_TABLET | Freq: Every day | ORAL | Status: DC
  Administered 2016-11-22 – 2016-11-26 (×5): 2000 [IU] via ORAL
  Filled 2016-11-21 (×5): qty 2

## 2016-11-21 MED ORDER — INSULIN ASPART 100 UNIT/ML ~~LOC~~ SOLN
3.0000 [IU] | Freq: Three times a day (TID) | SUBCUTANEOUS | Status: DC
  Administered 2016-11-21 – 2016-11-26 (×15): 3 [IU] via SUBCUTANEOUS

## 2016-11-21 MED ORDER — POTASSIUM CHLORIDE CRYS ER 10 MEQ PO TBCR
10.0000 meq | EXTENDED_RELEASE_TABLET | Freq: Every day | ORAL | Status: DC
  Administered 2016-11-22 – 2016-11-26 (×5): 10 meq via ORAL
  Filled 2016-11-21 (×6): qty 1

## 2016-11-21 MED ORDER — ACETAMINOPHEN 325 MG PO TABS
650.0000 mg | ORAL_TABLET | Freq: Four times a day (QID) | ORAL | Status: DC | PRN
  Administered 2016-11-23 – 2016-11-24 (×2): 650 mg via ORAL
  Filled 2016-11-21 (×2): qty 2

## 2016-11-21 MED ORDER — ENOXAPARIN SODIUM 30 MG/0.3ML ~~LOC~~ SOLN
30.0000 mg | SUBCUTANEOUS | Status: DC
  Administered 2016-11-22: 30 mg via SUBCUTANEOUS
  Filled 2016-11-21 (×2): qty 0.3

## 2016-11-21 MED ORDER — CARVEDILOL 12.5 MG PO TABS
25.0000 mg | ORAL_TABLET | Freq: Two times a day (BID) | ORAL | Status: DC
  Administered 2016-11-21 – 2016-11-25 (×9): 25 mg via ORAL
  Filled 2016-11-21 (×11): qty 2

## 2016-11-21 MED ORDER — VANCOMYCIN HCL 10 G IV SOLR: 1500.0000 mg | INTRAVENOUS | Status: DC

## 2016-11-21 MED ORDER — VANCOMYCIN HCL IN DEXTROSE 1-5 GM/200ML-% IV SOLN: 1000.0000 mg | Freq: Once | INTRAVENOUS | Status: DC

## 2016-11-21 MED ORDER — ATORVASTATIN CALCIUM 20 MG PO TABS
20.0000 mg | ORAL_TABLET | Freq: Every day | ORAL | Status: DC
  Administered 2016-11-22 – 2016-11-26 (×4): 20 mg via ORAL
  Filled 2016-11-21 (×5): qty 1

## 2016-11-21 MED ORDER — INSULIN GLARGINE 100 UNIT/ML ~~LOC~~ SOLN
70.0000 [IU] | Freq: Every day | SUBCUTANEOUS | Status: DC
  Administered 2016-11-21 – 2016-11-25 (×5): 70 [IU] via SUBCUTANEOUS
  Filled 2016-11-21 (×7): qty 0.7

## 2016-11-21 MED ORDER — OXYCODONE HCL 5 MG PO TABS
10.0000 mg | ORAL_TABLET | ORAL | Status: DC | PRN
  Administered 2016-11-21: 10 mg via ORAL
  Administered 2016-11-21: 5 mg via ORAL
  Administered 2016-11-22 – 2016-11-26 (×17): 10 mg via ORAL
  Filled 2016-11-21 (×20): qty 2

## 2016-11-21 MED ORDER — CEPHALEXIN 250 MG PO CAPS
500.0000 mg | ORAL_CAPSULE | Freq: Once | ORAL | Status: AC
  Administered 2016-11-21: 500 mg via ORAL
  Filled 2016-11-21: qty 2

## 2016-11-21 MED ORDER — INSULIN ASPART 100 UNIT/ML ~~LOC~~ SOLN
0.0000 [IU] | Freq: Every day | SUBCUTANEOUS | Status: DC
  Administered 2016-11-21: 4 [IU] via SUBCUTANEOUS
  Administered 2016-11-22: 2 [IU] via SUBCUTANEOUS

## 2016-11-21 MED ORDER — ALBUTEROL SULFATE (2.5 MG/3ML) 0.083% IN NEBU
2.5000 mg | INHALATION_SOLUTION | Freq: Four times a day (QID) | RESPIRATORY_TRACT | Status: DC
  Administered 2016-11-21: 2.5 mg via RESPIRATORY_TRACT
  Filled 2016-11-21: qty 3

## 2016-11-21 MED ORDER — ONDANSETRON HCL 4 MG/2ML IJ SOLN: 4.0000 mg | Freq: Four times a day (QID) | INTRAMUSCULAR | Status: DC | PRN

## 2016-11-21 MED ORDER — SODIUM CHLORIDE 0.9% FLUSH
3.0000 mL | Freq: Two times a day (BID) | INTRAVENOUS | Status: DC
  Administered 2016-11-22 – 2016-11-26 (×6): 3 mL via INTRAVENOUS

## 2016-11-21 MED ORDER — MAGNESIUM CITRATE PO SOLN
1.0000 | Freq: Once | ORAL | Status: AC
  Administered 2016-11-21: 1 via ORAL
  Filled 2016-11-21: qty 296

## 2016-11-21 MED ORDER — FLEET ENEMA 7-19 GM/118ML RE ENEM
1.0000 | ENEMA | Freq: Once | RECTAL | Status: AC | PRN
  Administered 2016-11-22: 1 via RECTAL
  Filled 2016-11-21: qty 1

## 2016-11-21 MED ORDER — DEXTROSE 5 % IV SOLN
1.0000 g | INTRAVENOUS | Status: DC
  Filled 2016-11-21: qty 10

## 2016-11-21 MED ORDER — METHOCARBAMOL 500 MG PO TABS
500.0000 mg | ORAL_TABLET | Freq: Four times a day (QID) | ORAL | Status: DC | PRN
  Administered 2016-11-21 – 2016-11-26 (×6): 500 mg via ORAL
  Filled 2016-11-21 (×7): qty 1

## 2016-11-21 MED ORDER — WARFARIN SODIUM 5 MG PO TABS
2.5000 mg | ORAL_TABLET | Freq: Once | ORAL | Status: AC
  Administered 2016-11-21: 2.5 mg via ORAL
  Filled 2016-11-21: qty 1

## 2016-11-21 MED ORDER — ACETAMINOPHEN 500 MG PO TABS: 1000.0000 mg | ORAL_TABLET | Freq: Four times a day (QID) | ORAL | Status: DC | PRN

## 2016-11-21 MED ORDER — ACETAMINOPHEN 650 MG RE SUPP: 650.0000 mg | Freq: Four times a day (QID) | RECTAL | Status: DC | PRN

## 2016-11-21 MED ORDER — FUROSEMIDE 20 MG PO TABS
60.0000 mg | ORAL_TABLET | Freq: Every day | ORAL | Status: DC
  Administered 2016-11-22 – 2016-11-23 (×2): 60 mg via ORAL
  Filled 2016-11-21 (×3): qty 3

## 2016-11-21 MED ORDER — WARFARIN - PHARMACIST DOSING INPATIENT
Freq: Every day | Status: DC
  Administered 2016-11-23: 18:00:00

## 2016-11-21 MED ORDER — INSULIN ASPART 100 UNIT/ML ~~LOC~~ SOLN
0.0000 [IU] | Freq: Three times a day (TID) | SUBCUTANEOUS | Status: DC
  Administered 2016-11-21: 2 [IU] via SUBCUTANEOUS
  Administered 2016-11-22: 5 [IU] via SUBCUTANEOUS
  Administered 2016-11-22 – 2016-11-23 (×3): 3 [IU] via SUBCUTANEOUS
  Administered 2016-11-23: 1 [IU] via SUBCUTANEOUS
  Administered 2016-11-23: 3 [IU] via SUBCUTANEOUS
  Administered 2016-11-24: 1 [IU] via SUBCUTANEOUS
  Administered 2016-11-24: 3 [IU] via SUBCUTANEOUS
  Administered 2016-11-25: 2 [IU] via SUBCUTANEOUS
  Administered 2016-11-25: 1 [IU] via SUBCUTANEOUS
  Administered 2016-11-25: 3 [IU] via SUBCUTANEOUS
  Administered 2016-11-26: 2 [IU] via SUBCUTANEOUS

## 2016-11-21 MED ORDER — DIGOXIN 125 MCG PO TABS
0.1250 mg | ORAL_TABLET | ORAL | Status: DC
  Administered 2016-11-22 – 2016-11-26 (×3): 0.125 mg via ORAL
  Filled 2016-11-21 (×4): qty 1

## 2016-11-21 MED ORDER — ALBUTEROL SULFATE (2.5 MG/3ML) 0.083% IN NEBU: 2.5000 mg | INHALATION_SOLUTION | Freq: Four times a day (QID) | RESPIRATORY_TRACT | Status: DC | PRN

## 2016-11-21 MED ORDER — SENNOSIDES-DOCUSATE SODIUM 8.6-50 MG PO TABS
2.0000 | ORAL_TABLET | Freq: Every day | ORAL | Status: DC
  Administered 2016-11-21: 3 via ORAL
  Administered 2016-11-22: 2 via ORAL
  Administered 2016-11-23 – 2016-11-24 (×2): 3 via ORAL
  Administered 2016-11-25: 2 via ORAL
  Filled 2016-11-21: qty 2
  Filled 2016-11-21: qty 3
  Filled 2016-11-21: qty 2
  Filled 2016-11-21 (×2): qty 3

## 2016-11-21 MED ORDER — SODIUM CHLORIDE 0.9 % IV SOLN: INTRAVENOUS | Status: AC

## 2016-11-21 MED ORDER — VANCOMYCIN HCL IN DEXTROSE 1-5 GM/200ML-% IV SOLN
1000.0000 mg | Freq: Once | INTRAVENOUS | Status: AC
  Administered 2016-11-21: 1000 mg via INTRAVENOUS
  Filled 2016-11-21: qty 200

## 2016-11-21 MED ORDER — ONDANSETRON HCL 4 MG PO TABS: 4.0000 mg | ORAL_TABLET | Freq: Four times a day (QID) | ORAL | Status: DC | PRN

## 2016-11-21 NOTE — ED Triage Notes (Signed)
Patient complains of pain and blisters to left lower leg since knee replacement last Monday. Also BS running in 300's this am. Alert and oriented, NAD

## 2016-11-21 NOTE — ED Notes (Signed)
Pt. Given crackers and water

## 2016-11-21 NOTE — ED Notes (Signed)
Patient states he has not had a bowel movement since surgery on Monday 11-15-16

## 2016-11-21 NOTE — ED Notes (Signed)
Informed pt. We need urine for testing..the patient. Unable to urinate at this time, he will call out when he can go

## 2016-11-21 NOTE — Progress Notes (Signed)
CPM ordered per postop instructions. Patient stated that he could not tolerate CPM with current pain in his leg. He stated that he had talked to [place], [place] and was told to hold off on the CPM for now. CPM not ordered from ortho technician. Florian Buff, RN
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
## 2016-11-21 NOTE — Progress Notes (Signed)
Pharmacy Antibiotic Note  Dennis Williams is a 83 y.o. male admitted on 11/21/2016 with cellulitis.  Pharmacy has been consulted for vancomycin dosing. Cr elevated at 1.98 with Crcl 39 ml/min.  Vancomycin trough 10-15  Plan: 1) Vancomycin 2g IV total loading dose then 1500mg  IV q24 2) Follow renal function, cultures, LOT, level if needed     Temp (24hrs), Avg:98.9 F (37.2 C), Min:98.9 F (37.2 C), Max:98.9 F (37.2 C)   Recent Labs Lab 11/15/16 1302 11/16/16 0343 11/17/16 0522 11/21/16 1120 11/21/16 1128  WBC 7.7 11.6* 10.9* 10.3  --   CREATININE 1.78* 1.91*  --  1.98*  --   LATICACIDVEN  --   --   --   --  1.03    Estimated Creatinine Clearance: 38.6 mL/min (A) (by C-G formula based on SCr of 1.98 mg/dL (H)).    Allergies  Allergen Reactions  . Dilaudid [Hydromorphone Hcl] Nausea And Vomiting and Other (See Comments)    Felt hot  . No Known Allergies     Antimicrobials this admission: 6/24 Vancomycin >>  Dose adjustments this admission: n/a  Microbiology results: None yet  Thank you for allowing pharmacy to be a part of this patient's care.  Deboraha Sprang 11/21/2016 4:22 PM

## 2016-11-21 NOTE — Progress Notes (Signed)
VASCULAR LAB PRELIMINARY  PRELIMINARY  PRELIMINARY  PRELIMINARY  Left lower extremity venous duplex completed.    Preliminary report:  There is no DVT or SVT noted in the veins of the left lower extremity.   Gave report to Dr. Jerrell Mylar, [facility], RVT 11/21/2016, 2:41 PM
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
## 2016-11-21 NOTE — ED Notes (Signed)
Vascular tech paged by [employer]

## 2016-11-21 NOTE — Progress Notes (Signed)
Patient's CPAP set up at bedside. Patient states that he will place it on for the night. RT will continue to monitor as needed.
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
## 2016-11-22 DIAGNOSIS — G4730 Sleep apnea, unspecified: Secondary | ICD-10-CM

## 2016-11-22 DIAGNOSIS — L03116 Cellulitis of left lower limb: Secondary | ICD-10-CM

## 2016-11-22 DIAGNOSIS — I5022 Chronic systolic (congestive) heart failure: Secondary | ICD-10-CM

## 2016-11-22 DIAGNOSIS — J9601 Acute respiratory failure with hypoxia: Secondary | ICD-10-CM

## 2016-11-22 LAB — BASIC METABOLIC PANEL
Anion gap: 11 (ref 5–15)
BUN: 43 mg/dL — ABNORMAL HIGH (ref 6–20)
CO2: 27 mmol/L (ref 22–32)
Calcium: 8 mg/dL — ABNORMAL LOW (ref 8.9–10.3)
Chloride: 97 mmol/L — ABNORMAL LOW (ref 101–111)
Creatinine, Ser: 1.9 mg/dL — ABNORMAL HIGH (ref 0.61–1.24)
GFR calc Af Amer: 37 mL/min — ABNORMAL LOW (ref >60)
GFR calc non Af Amer: 32 mL/min — ABNORMAL LOW (ref >60)
Glucose, Bld: 234 mg/dL — ABNORMAL HIGH (ref 65–99)
Potassium: 4.1 mmol/L (ref 3.5–5.1)
Sodium: 135 mmol/L (ref 135–145)

## 2016-11-22 LAB — GLUCOSE, CAPILLARY
Glucose-Capillary: 224 mg/dL — ABNORMAL HIGH (ref 65–99)
Glucose-Capillary: 228 mg/dL — ABNORMAL HIGH (ref 65–99)
Glucose-Capillary: 302 mg/dL — ABNORMAL HIGH (ref 65–99)
Glucose-Capillary: 242 mg/dL — ABNORMAL HIGH (ref 65–99)

## 2016-11-22 LAB — CBC
HCT: 30.1 % — ABNORMAL LOW (ref 39.0–52.0)
Hemoglobin: 9.7 g/dL — ABNORMAL LOW (ref 13.0–17.0)
MCH: 30.2 pg (ref 26.0–34.0)
MCHC: 32.2 g/dL (ref 30.0–36.0)
MCV: 93.8 fL (ref 78.0–100.0)
Platelets: 214 10*3/uL (ref 150–400)
RBC: 3.21 MIL/uL — ABNORMAL LOW (ref 4.22–5.81)
RDW: 13.6 % (ref 11.5–15.5)
WBC: 9.6 10*3/uL (ref 4.0–10.5)

## 2016-11-22 LAB — PROTIME-INR
INR: 1.67
Prothrombin Time: 19.9 seconds — ABNORMAL HIGH (ref 11.4–15.2)

## 2016-11-22 MED ORDER — CEFAZOLIN SODIUM-DEXTROSE 2-4 GM/100ML-% IV SOLN
2.0000 g | Freq: Three times a day (TID) | INTRAVENOUS | Status: DC
  Administered 2016-11-22 – 2016-11-23 (×4): 2 g via INTRAVENOUS
  Filled 2016-11-22 (×6): qty 100

## 2016-11-22 MED ORDER — DEXTROSE 5 % IV SOLN
1.0000 g | INTRAVENOUS | Status: DC
  Administered 2016-11-22: 1 g via INTRAVENOUS
  Filled 2016-11-22: qty 10

## 2016-11-22 MED ORDER — VANCOMYCIN HCL 10 G IV SOLR
1500.0000 mg | INTRAVENOUS | Status: DC
  Administered 2016-11-22 – 2016-11-23 (×2): 1500 mg via INTRAVENOUS
  Filled 2016-11-22 (×3): qty 1500

## 2016-11-22 MED ORDER — HYDROXYZINE HCL 10 MG PO TABS
10.0000 mg | ORAL_TABLET | Freq: Three times a day (TID) | ORAL | Status: DC | PRN
  Administered 2016-11-22: 10 mg via ORAL
  Filled 2016-11-22 (×2): qty 1

## 2016-11-22 MED ORDER — WARFARIN SODIUM 6 MG PO TABS
6.0000 mg | ORAL_TABLET | Freq: Once | ORAL | Status: AC
  Administered 2016-11-22: 6 mg via ORAL
  Filled 2016-11-22: qty 1

## 2016-11-22 NOTE — Evaluation (Signed)
Physical Therapy Evaluation Patient Details Name: Dennis Williams MRN: [id number] DOB: December 07, 1933 Today's Date: 11/22/2016   History of Present Illness   Pt is a 89 male s/p L TKA on 6/18 admitted with infection and L LE cellulitis with blisters. PMH: CHF, HTN, AFIB. PSH: back surgery.  Clinical Impression  Pt mobilizing well considering condition of L LE. Acute PT to follow and progress ambulation tolerance and indep with mobility.    Follow Up Recommendations Home health PT;Supervision/Assistance - 24 hour    Equipment Recommendations  None recommended by PT    Recommendations for Other Services       Precautions / Restrictions Precautions Precautions: Fall;Knee Precaution Comments: pt able to recall precautions Restrictions Weight Bearing Restrictions: Yes LLE Weight Bearing: Weight bearing as tolerated      Mobility  Bed Mobility               General bed mobility comments: pt sitting on EOB  Transfers Overall transfer level: Needs assistance Equipment used: Rolling walker (2 wheeled) Transfers: Sit to/from Stand Sit to Stand: Supervision         General transfer comment: v/c's to push up from bed  Ambulation/Gait Ambulation/Gait assistance: Min guard Ambulation Distance (Feet): 50 Feet Assistive device: Rolling walker (2 wheeled) Gait Pattern/deviations: Step-to pattern;Antalgic;Trunk flexed;Decreased stride length Gait velocity: slow Gait velocity interpretation: Below normal speed for age/gender General Gait Details: pt with increased dependence on bilat UEs, pt c/o "Its my back that gives out." very antaglic gait due to back pain and L knee pain  Stairs            Wheelchair Mobility    Modified Rankin (Stroke Patients Only)       Balance Overall balance assessment: Needs assistance Sitting-balance support: No upper extremity supported;Feet supported Sitting balance-Leahy Scale: Good     Standing balance support: During  functional activity Standing balance-Leahy Scale: Poor Standing balance comment: dependent on RW for amb                             Pertinent Vitals/Pain Pain Assessment: 0-10 Pain Score: 7  Pain Location: left knee Pain Descriptors / Indicators: Aching;Sore;Grimacing    Home Living Family/patient expects to be discharged to:: Private residence Living Arrangements: Spouse/significant other Available Help at Discharge: Family;Available 24 hours/day Type of Home: House Home Access: Level entry     Home Layout: One level Home Equipment: Walker - 2 wheels;Bedside commode;Grab bars - tub/shower;Grab bars - toilet;Hand held shower head      Prior Function Level of Independence: Independent with assistive device(s)         Comments: limited ambulation tolerance due to L knee pain. has been sponge bathing     Hand Dominance   Dominant Hand: Right    Extremity/Trunk Assessment   Upper Extremity Assessment Upper Extremity Assessment: Overall WFL for tasks assessed    Lower Extremity Assessment Lower Extremity Assessment: LLE deficits/detail LLE Deficits / Details: able to complete LAQ with max encouragement to achieve terminal knee extension and hold it.    Cervical / Trunk Assessment Cervical / Trunk Assessment: Normal  Communication   Communication: No difficulties  Cognition Arousal/Alertness: Awake/alert Behavior During Therapy: WFL for tasks assessed/performed Overall Cognitive Status: Within Functional Limits for tasks assessed  General Comments: Oriented x3. Pt perseverating on certain topics.       General Comments General comments (skin integrity, edema, etc.): L LE very edematous, +2 pitting in foot, calf and thigh feel tight, erythemia from knee to ankle, many blisters with dressings over them    Exercises Total Joint Exercises Ankle Circles/Pumps: AROM;Left;10 reps;Supine Heel Slides:  AAROM;Left;10 reps;Supine Hip ABduction/ADduction: Left;10 reps;AAROM;Supine Long Arc Quad: AROM;Left;10 reps;Seated Goniometric ROM: 85 deg active knee flexion in sitting   Assessment/Plan    PT Assessment Patient needs continued PT services  PT Problem List Decreased strength;Decreased range of motion;Decreased activity tolerance;Decreased balance;Decreased mobility;Decreased knowledge of use of DME;Pain       PT Treatment Interventions DME instruction;Gait training;Stair training;Therapeutic activities;Functional mobility training;Therapeutic exercise;Balance training;Neuromuscular re-education    PT Goals (Current goals can be found in the Care Plan section)  Acute Rehab PT Goals Patient Stated Goal: not to loose my leg PT Goal Formulation: With patient Time For Goal Achievement: 11/29/16 Potential to Achieve Goals: Fair    Frequency 7X/week   Barriers to discharge        Co-evaluation               AM-PAC PT "6 Clicks" Daily Activity  Outcome Measure Difficulty turning over in bed (including adjusting bedclothes, sheets and blankets)?: A Little Difficulty moving from lying on back to sitting on the side of the bed? : A Little Difficulty sitting down on and standing up from a chair with arms (e.g., wheelchair, bedside commode, etc,.)?: A Little Help needed moving to and from a bed to chair (including a wheelchair)?: A Little Help needed walking in hospital room?: A Little Help needed climbing 3-5 steps with a railing? : A Little 6 Click Score: 18    End of Session Equipment Utilized During Treatment: Gait belt Activity Tolerance: Patient tolerated treatment well Patient left: in chair;with call bell/phone within reach;with family/visitor present Nurse Communication: Mobility status PT Visit Diagnosis: Unsteadiness on feet (R26.81);Muscle weakness (generalized) (M62.81);Pain Pain - Right/Left: Left Pain - part of body: Knee    Time: 7425-9563 PT Time  Calculation (min) (ACUTE ONLY): 15 min   Charges:   PT Evaluation $PT Eval Moderate Complexity: 1 Procedure     PT G CodesKittie Plater, PT, DPT Pager #: [phone] Office #: [phone]   Marvens Hollars M Rastus Borton 11/22/2016, 8:33 AM

## 2016-11-22 NOTE — Progress Notes (Signed)
SPORTS MEDICINE AND JOINT REPLACEMENT  Lara Mulch, MD    [name], PA-C [address], [place], [place]  [id number]                             [phone]   PROGRESS NOTE  Subjective:  negative for Chest Pain  negative for Shortness of Breath  negative for Nausea/Vomiting   negative for Calf Pain  positive for Bowel Movement   Tolerating Diet: yes         Patient reports pain as 4 on 0-10 scale.    Objective: Vital signs in last 24 hours:   Patient Vitals for the past 24 hrs:  BP Temp Temp src Pulse Resp SpO2  11/22/16 0516 138/70 98.8 F (37.1 C) Oral 91 20 93 %  11/21/16 2120 (!) 123/56 99.1 F (37.3 C) Oral 100 20 95 %  11/21/16 1645 (!) 145/84 - - 96 (!) 30 92 %  11/21/16 1615 139/75 - - 94 - 96 %  11/21/16 1545 121/70 - - 82 - 93 %  11/21/16 1400 137/73 - - 82 - 92 %  11/21/16 1352 124/78 - - (!) 116 18 91 %  11/21/16 1330 140/78 - - 95 - 93 %  11/21/16 1245 (!) 142/78 - - 84 - -    @flow {1959:LAST@   Intake/Output from previous day:   06/24 0701 - 06/25 0700 In: 780 [P.O.:480; I.V.:300] Out: 800 [Urine:800]   Intake/Output this shift:   No intake/output data recorded.   Intake/Output      06/24 0701 - 06/25 0700 06/25 0701 - 06/26 0700   P.O. 480    I.V. 300    Total Intake 780     Urine 800    Total Output 800     Net -20          Urine Occurrence 4 x    Stool Occurrence 2 x       LABORATORY DATA:  Recent Labs  11/15/16 1302 11/16/16 0343 11/17/16 0522 11/21/16 1120 11/22/16 0537  WBC 7.7 11.6* 10.9* 10.3 9.6  HGB 13.5 11.9* 11.2* 10.4* 9.7*  HCT 42.8 37.9* 35.6* 32.2* 30.1*  PLT 146* 138* 139* 206 214    Recent Labs  11/15/16 1302 11/16/16 0343 11/21/16 1120 11/22/16 0537  NA  --  135 132* 135  K  --  4.6 4.4 4.1  CL  --  98* 95* 97*  CO2  --  29 27 27   BUN  --  33* 48* 43*  CREATININE 1.78* 1.91* 1.98* 1.90*  GLUCOSE  --  252* 203* 234*  CALCIUM  --  8.0* 8.3* 8.0*   Lab Results  Component Value Date   INR 1.67 11/22/2016   INR 1.44 11/21/2016   INR 1.37 11/17/2016    Examination:  General appearance: alert, cooperative and no distress Extremities: extremities normal, atraumatic, no cyanosis or edema  Wound Exam: draining   Drainage:  Scant/small amount Bloody exudate  Motor Exam: Quadriceps and Hamstrings Intact  Sensory Exam: Superficial Peroneal, Deep Peroneal and Tibial normal   Assessment:         ADDITIONAL DIAGNOSIS:  Principal Problem:   Acute respiratory failure with hypoxia (HCC) Active Problems:   Atrial fibrillation (HCC)   Hypertension   CHF (congestive heart failure) (HCC)   S/P total knee replacement   Diabetes (HCC)   Cellulitis   Sleep apnea   Constipation  Plan: Physical Therapy as ordered Weight Bearing as Tolerated (WBAT)  DVT Prophylaxis:  Lovenox  DISCHARGE PLAN: Home  DISCHARGE NEEDS: HHPT   Patient was seen by ortho, agree with current treatment. D/C on abx and continue HHPT for s/p tka.          Donia Ast 11/22/2016, 12:27 PM

## 2016-11-22 NOTE — Progress Notes (Signed)
Patient developed a generalized red skin tone in face and upper chest. Patient also had a low grade fever of 99.4 and stated he was itching all over. Dr. Quincy Simmonds was paged and stated he would assess the patient. Atarax was given, will continue to monitor.

## 2016-11-22 NOTE — Progress Notes (Addendum)
ANTICOAGULATION CONSULT NOTE - FOLLOW UP  Pharmacy Consult:  Coumadin Indication: atrial fibrillation  Allergies  Allergen Reactions  . Dilaudid [Hydromorphone Hcl] Nausea And Vomiting and Other (See Comments)    Felt hot  . No Known Allergies     Vital Signs: Temp: 98.8 F (37.1 C) (06/25 0516) Temp Source: Oral (06/25 0516) BP: 138/70 (06/25 0516) Pulse Rate: 91 (06/25 0516)  Labs:  Recent Labs  11/21/16 1120 11/21/16 1220 11/22/16 0537  HGB 10.4*  --  9.7*  HCT 32.2*  --  30.1*  PLT 206  --  214  LABPROT  --  17.7* 19.9*  INR  --  1.44 1.67  CREATININE 1.98*  --  1.90*    Estimated Creatinine Clearance: 40.2 mL/min (A) (by C-G formula based on SCr of 1.9 mg/dL (H)).   Assessment: 73 YOM with history of Afib to continue on Coumadin and Lovenox bridge.  INR sub-therapeutic but is trending up.  No bleeding reported.  Home dose: 3.5mg  alternating with 4mg    Goal of Therapy:  INR 2-3 Monitor platelets by anticoagulation protocol: Yes    Plan:  - Repeat Coumadin 6mg  PO today - Lovenox 30mg  SQ Q24H per MD - Daily PT / INR - F/U with increasing Lantus for better glycemic control   Tychelle Purkey D. Mina Marble, PharmD, BCPS Pager:  [phone] 11/22/2016, 9:42 AM    ==========================  Addendum: - narrow Rocephin to Ancef - Ancef 2gm IV Q8H - Monitor renal fxn, clinical progress   Qiana Landgrebe D. Mina Marble, PharmD, BCPS Pager:  [phone] 11/22/2016, 11:53 AM

## 2016-11-22 NOTE — Progress Notes (Signed)
Orthopedic Tech Progress Note Patient Details:  Dennis Williams 06-06-1933 [id number]  CPM Left Knee CPM Left Knee: Off Additional Comments: cpm placed in pt room.  pt stated doctor is to remove cpm order until pt knee infection is controlled.  In the meantime cpm will be placed at bedside until order is changed.   Left knee/leg   Kristopher Oppenheim 11/22/2016, 12:17 PM

## 2016-11-22 NOTE — Consult Note (Addendum)
[place] Nurse wound consult note Reason for Consult: Consult requested for left leg cellulitis.  Pt recently had knee surgery on 6/18 and states he developed erythrema and blisters a few days ago.   Wound type: Generalized erythema and edema involving area from left thigh to left foot; multiple patchy areas of clear fluid filled blisters. Drainage (amount, consistency, odor) Small amt yellow drainage from some of the blisters which have ruptured and evolved into partial thickness wounds which are pink and moist Dressing procedure/placement/frequency: Multiple foam dressings have been previously applied by the bedside nurse; agree with this plan of care to protect from further injury and absorb drainage until further input is available from the ortho team. EMR indicate ortho consult is pending since pt recently had surgery at this site; please refer to their team for further plan of care. Please re-consult if further assistance is needed.  Thank-you,  Julien Girt MSN, [place], [place], [place], [place]

## 2016-11-22 NOTE — Progress Notes (Signed)
Patient has CPAP at bedside and will place self on when ready. RT will continue to monitor.

## 2016-11-22 NOTE — Progress Notes (Addendum)
Inpatient Diabetes Program Recommendations  AACE/ADA: New Consensus Statement on Inpatient Glycemic Control (2015)  Target Ranges:  Prepandial:   less than 140 mg/dL      Peak postprandial:   less than 180 mg/dL (1-2 hours)      Critically ill patients:  140 - 180 mg/dL   Results for NUMA, SCHROETER (MRN [id number]) as of 11/22/2016 12:25  Ref. Range 11/21/2016 13:23 11/21/2016 21:25 11/22/2016 06:05 11/22/2016 11:33  Glucose-Capillary Latest Ref Range: 65 - 99 mg/dL 189 (H) 328 (H) 224 (H) 6 units given 228 (H) 6 units given   Review of Glycemic Control  Diabetes history: DM 2 Outpatient Diabetes medications: Lantus 70 units, Humalog 22 units tid Current orders for Inpatient glycemic control: Lantus 70 units, Novolog Sensitive 0-9 units tid + Novolog HS scale + Novolog 3 units tid meal coverage  Inpatient Diabetes Program Recommendations:    Patient takes more meal coverage at home. Glucose in the 200's. Patient getting 6 units with meals so far today with unchanged glucose levels. Consider increasing meal coverage to Novolog 6 units tid with meals.   Thanks,  Tama Headings RN, MSN, [facility] Inpatient Diabetes Coordinator Team Pager [phone] (8a-5p)

## 2016-11-22 NOTE — Progress Notes (Signed)
PROGRESS NOTE [facility]   Dennis Williams   NKN:[id number] DOB: [birth year]  DOA: 11/21/2016 PCP: Myrlene Broker, MD   Brief Narrative:  Dennis Williams is a 83 year old male with medical history significant for diabetes, chronic kidney disease, hypertension, A. fib on Coumadin, 08 of left knee, status post left knee replacement 6 days prior to admission. Presented to the emergency department complaining of left leg pain and swelling on evaluation revealed left lower leg cellulitis with open blister and purulent drainage. Upon ED evaluation patient was found to be hypoxemic 89% on room air. Patient admitted for IV antibiotics and further evaluation. Doppler of the leg negative for DVT  Subjective: Patient seen and examined reports mild improvement in the erythema, still painful and swollen.  Assessment & Plan: Cellulitis of left lower leg - status post left knee replacement 6 days prior to admission Significant swelling and erythema, some purulent drainage in the anterior aspect of the leg.  Will continue vancomycin and add cefazolin, likely will benefit from 48 hours of IV antibiotic. Wound Care and orthopedic team recommendations appreciated Follow up blood cultures  Status post left knee replacement 11/14/16 Pain management as needed Continue physical therapy Management per orthopedic team  OSA O2 saturation 89% on room air on admission, likely to be baseline. Chest x-ray negative Continue CPAP at night If hypoxia worsen will consider to rule out PE  Chronic systolic heart failure Seems to be compensated at this time Continue Coreg and Lasix Monitor INO's and daily weights  A. fib currently heart rate well controlled Continue current regimen Patient on Lovenox and Coumadin being breech Check INR  Hypertension BP stable Continue current regimen  DVT prophylaxis: Lovenox and Coumadin Code Status: (Full code Family Communication: Family at  bedside Disposition Plan: Home when medically stable  Consultants:   Orthopedic surgery  Procedures:   None  Antimicrobials:  None   Objective: Vitals:   11/21/16 1645 11/21/16 2120 11/22/16 0516 11/22/16 1347  BP: (!) 145/84 (!) 123/56 138/70 (!) 136/54  Pulse: 96 100 91 81  Resp: (!) 30 20 20 18   Temp:  99.1 F (37.3 C) 98.8 F (37.1 C) 98.2 F (36.8 C)  TempSrc:  Oral Oral Oral  SpO2: 92% 95% 93% 96%    Intake/Output Summary (Last 24 hours) at 11/22/16 1837 Last data filed at 11/22/16 1700  Gross per 24 hour  Intake             1740 ml  Output                0 ml  Net             1740 ml   There were no vitals filed for this visit.  Examination:  General exam: Appears calm and comfortable  HEENT: OP moist and clear Respiratory system: Clear to auscultation. No wheezes,crackle or rhonchi Cardiovascular system: S1 & S2 heard, RRR. No JVD, murmurs, rubs or gallops Gastrointestinal system: Abdomen is nondistended, soft and nontender. No organomegaly or masses felt. Normal bowel sounds heard. Central nervous system: Alert and oriented. No focal neurological deficits. Extremities: LLE erythematous, swollen and tense. Some scattered blisters, and 1 open blister with purulent drainage   Skin: LLE erythematous, swollen and tense. Some scattered blisters, and 1 open blister with purulent drainage  Psychiatry: Judgement and insight appear normal. Mood & affect appropriate.    Data Reviewed: I have personally reviewed following labs and imaging studies  CBC:  Recent Labs Lab  11/16/16 0343 11/17/16 0522 11/21/16 1120 11/22/16 0537  WBC 11.6* 10.9* 10.3 9.6  NEUTROABS  --   --  7.3  --   HGB 11.9* 11.2* 10.4* 9.7*  HCT 37.9* 35.6* 32.2* 30.1*  MCV 95.0 96.2 93.9 93.8  PLT 138* 139* 206 563   Basic Metabolic Panel:  Recent Labs Lab 11/16/16 0343 11/21/16 1120 11/22/16 0537  NA 135 132* 135  K 4.6 4.4 4.1  CL 98* 95* 97*  CO2 29 27 27   GLUCOSE 252*  203* 234*  BUN 33* 48* 43*  CREATININE 1.91* 1.98* 1.90*  CALCIUM 8.0* 8.3* 8.0*   GFR: Estimated Creatinine Clearance: 40.2 mL/min (A) (by C-G formula based on SCr of 1.9 mg/dL (H)). Liver Function Tests:  Recent Labs Lab 11/21/16 1120  AST 33  ALT 40  ALKPHOS 80  BILITOT 1.1  PROT 6.3*  ALBUMIN 2.9*   No results for input(s): LIPASE, AMYLASE in the last 168 hours. No results for input(s): AMMONIA in the last 168 hours. Coagulation Profile:  Recent Labs Lab 11/16/16 0343 11/17/16 0522 11/21/16 1220 11/22/16 0537  INR 1.23 1.37 1.44 1.67   Cardiac Enzymes: No results for input(s): CKTOTAL, CKMB, CKMBINDEX, TROPONINI in the last 168 hours. BNP (last 3 results) No results for input(s): PROBNP in the last 8760 hours. HbA1C: No results for input(s): HGBA1C in the last 72 hours. CBG:  Recent Labs Lab 11/21/16 1323 11/21/16 2125 11/22/16 0605 11/22/16 1133 11/22/16 1636  GLUCAP 189* 328* 224* 228* 302*   Lipid Profile: No results for input(s): CHOL, HDL, LDLCALC, TRIG, CHOLHDL, LDLDIRECT in the last 72 hours. Thyroid Function Tests: No results for input(s): TSH, T4TOTAL, FREET4, T3FREE, THYROIDAB in the last 72 hours. Anemia Panel: No results for input(s): VITAMINB12, FOLATE, FERRITIN, TIBC, IRON, RETICCTPCT in the last 72 hours. Sepsis Labs:  Recent Labs Lab 11/21/16 1128  LATICACIDVEN 1.03    Recent Results (from the past 240 hour(s))  Culture, blood (routine x 2)     Status: None (Preliminary result)   Collection Time: 11/21/16  5:21 PM  Result Value Ref Range Status   Specimen Description BLOOD LEFT ANTECUBITAL  Final   Special Requests IN PEDIATRIC BOTTLE Blood Culture adequate volume  Final   Culture NO GROWTH < 24 HOURS  Final   Report Status PENDING  Incomplete  Culture, blood (routine x 2)     Status: None (Preliminary result)   Collection Time: 11/21/16  5:21 PM  Result Value Ref Range Status   Specimen Description BLOOD RIGHT ANTECUBITAL   Final   Special Requests IN PEDIATRIC BOTTLE Blood Culture adequate volume  Final   Culture NO GROWTH < 24 HOURS  Final   Report Status PENDING  Incomplete         Radiology Studies: Dg Chest Port 1 View  Result Date: 11/21/2016 CLINICAL DATA:  Patient complains of pain and blisters to left lower leg since knee replacement last Monday. Also BS running in 300's this am; pt denies SOB or CP. Hx of DM, HTN, and CHF; former smoker EXAM: PORTABLE CHEST 1 VIEW COMPARISON:  03/13/2016 FINDINGS: Cardiac silhouette is normal in size. No mediastinal or hilar masses. No convincing adenopathy. Clear lungs.  No pleural effusion or pneumothorax. Skeletal structures are demineralized but intact. IMPRESSION: No active disease. Electronically Signed   By: Lajean Manes M.D.   On: 11/21/2016 16:53      Scheduled Meds: . atorvastatin  20 mg Oral Daily  . carvedilol  25 mg  Oral BID WC  . cholecalciferol  2,000 Units Oral Daily  . digoxin  0.125 mg Oral QODAY  . enoxaparin  30 mg Subcutaneous Q24H  . furosemide  60 mg Oral Daily  . insulin aspart  0-5 Units Subcutaneous QHS  . insulin aspart  0-9 Units Subcutaneous TID WC  . insulin aspart  3 Units Subcutaneous TID WC  . insulin glargine  70 Units Subcutaneous QHS  . potassium chloride  10 mEq Oral Daily  . senna-docusate  2-3 tablet Oral QHS  . sodium chloride flush  3 mL Intravenous Q12H  . Warfarin - Pharmacist Dosing Inpatient   Does not apply q1800   Continuous Infusions: .  ceFAZolin (ANCEF) IV Stopped (11/22/16 1531)  . vancomycin Stopped (11/22/16 1317)     LOS: 1 day    Time spent: Total of 25 minutes spent with pt, greater than 50% of which was spent in discussion of  treatment, counseling and coordination of care    Chipper Oman, MD Pager: Text Page via [email or web address]  [phone]  If 7PM-7AM, please contact night-coverage [email or web address] Password [facility] 11/22/2016, 6:37 PM

## 2016-11-22 NOTE — Discharge Instructions (Signed)

## 2016-11-22 NOTE — Consult Note (Signed)
   [facility] CM Inpatient Consult   11/22/2016  DAIVIK OVERLEY 11-Aug-1933 [id number]  Patient referral from [facility] High risk from [employer]. Spoke with inpatient RNCM, Manuela Schwartz, regarding re-admission. Chart review reveals Patient is an 83 y.o. With HX of Diabetes, CKD 2, and HF.  Recently had a left knee replacement 6 days prior ro admission and began having blisters and swollen left leg with worsening pain and tenderness.   Met with the patient and family at bedside.  Patient endorses Dr. Janace Litten as his primary care provider.  Patient denies any needs currently for post hospital [facility] Care Management.  He requested that the brochure, 24 hour nurse advise line magnet and contact information be left at the bedside.  No needs identified at this time. For questions or referral please contact:  Natividad Brood, RN BSN [facility] Liaison  [phone] business mobile phone Toll free office [phone]

## 2016-11-23 DIAGNOSIS — I5021 Acute systolic (congestive) heart failure: Secondary | ICD-10-CM

## 2016-11-23 DIAGNOSIS — I481 Persistent atrial fibrillation: Secondary | ICD-10-CM

## 2016-11-23 LAB — PROTIME-INR
INR: 1.48
Prothrombin Time: 18.1 seconds — ABNORMAL HIGH (ref 11.4–15.2)

## 2016-11-23 LAB — BASIC METABOLIC PANEL
Anion gap: 9 (ref 5–15)
BUN: 43 mg/dL — ABNORMAL HIGH (ref 6–20)
CO2: 29 mmol/L (ref 22–32)
Calcium: 8 mg/dL — ABNORMAL LOW (ref 8.9–10.3)
Chloride: 95 mmol/L — ABNORMAL LOW (ref 101–111)
Creatinine, Ser: 2.16 mg/dL — ABNORMAL HIGH (ref 0.61–1.24)
GFR calc Af Amer: 31 mL/min — ABNORMAL LOW (ref >60)
GFR calc non Af Amer: 27 mL/min — ABNORMAL LOW (ref >60)
Glucose, Bld: 129 mg/dL — ABNORMAL HIGH (ref 65–99)
Potassium: 3.9 mmol/L (ref 3.5–5.1)
Sodium: 133 mmol/L — ABNORMAL LOW (ref 135–145)

## 2016-11-23 LAB — GLUCOSE, CAPILLARY
Glucose-Capillary: 143 mg/dL — ABNORMAL HIGH (ref 65–99)
Glucose-Capillary: 223 mg/dL — ABNORMAL HIGH (ref 65–99)
Glucose-Capillary: 215 mg/dL — ABNORMAL HIGH (ref 65–99)
Glucose-Capillary: 141 mg/dL — ABNORMAL HIGH (ref 65–99)

## 2016-11-23 LAB — HEMOGLOBIN A1C
Hgb A1c MFr Bld: 8.1 % — ABNORMAL HIGH (ref 4.8–5.6)
Mean Plasma Glucose: 186 mg/dL

## 2016-11-23 LAB — CBC
HCT: 32.8 % — ABNORMAL LOW (ref 39.0–52.0)
Hemoglobin: 10.2 g/dL — ABNORMAL LOW (ref 13.0–17.0)
MCH: 29.4 pg (ref 26.0–34.0)
MCHC: 31.1 g/dL (ref 30.0–36.0)
MCV: 94.5 fL (ref 78.0–100.0)
Platelets: 241 10*3/uL (ref 150–400)
RBC: 3.47 MIL/uL — ABNORMAL LOW (ref 4.22–5.81)
RDW: 13.7 % (ref 11.5–15.5)
WBC: 9 10*3/uL (ref 4.0–10.5)

## 2016-11-23 MED ORDER — WARFARIN SODIUM 6 MG PO TABS
6.0000 mg | ORAL_TABLET | Freq: Once | ORAL | Status: AC
  Administered 2016-11-23: 6 mg via ORAL
  Filled 2016-11-23: qty 1

## 2016-11-23 MED ORDER — ENOXAPARIN SODIUM 150 MG/ML ~~LOC~~ SOLN
125.0000 mg | SUBCUTANEOUS | Status: DC
  Administered 2016-11-23 – 2016-11-26 (×4): 125 mg via SUBCUTANEOUS
  Filled 2016-11-23 (×4): qty 0.83

## 2016-11-23 MED ORDER — PIPERACILLIN-TAZOBACTAM 3.375 G IVPB
3.3750 g | Freq: Three times a day (TID) | INTRAVENOUS | Status: DC
  Administered 2016-11-23 – 2016-11-26 (×8): 3.375 g via INTRAVENOUS
  Filled 2016-11-23 (×10): qty 50

## 2016-11-23 MED ORDER — FUROSEMIDE 10 MG/ML IJ SOLN
60.0000 mg | Freq: Once | INTRAMUSCULAR | Status: AC
  Administered 2016-11-23: 60 mg via INTRAVENOUS
  Filled 2016-11-23: qty 6

## 2016-11-23 MED ORDER — INSULIN GLARGINE 100 UNIT/ML ~~LOC~~ SOLN
10.0000 [IU] | Freq: Once | SUBCUTANEOUS | Status: AC
  Administered 2016-11-23: 10 [IU] via SUBCUTANEOUS
  Filled 2016-11-23: qty 0.1

## 2016-11-23 NOTE — Progress Notes (Addendum)
PROGRESS NOTE [facility]   SHAHEER BONFIELD   ZOX:[id number] DOB: [birth year]  DOA: 11/21/2016 PCP: Myrlene Broker, MD   Brief Narrative:  Dennis Williams   83 year old male with medical history significant for diabetes, chronic kidney disease, hypertension, A. fib on Coumadin, 08 of left knee, status post left knee replacement 6 days prior to admission. Presented to the emergency department complaining of left leg pain and swelling on evaluation revealed left lower leg cellulitis with open blister and purulent drainage. Upon ED evaluation patient was found to be hypoxemic 89% on room air. Patient admitted for IV antibiotics and further evaluation. Doppler of the leg negative for DVT  Subjective: He has swelling and tense edema throughout his leg and painful blisters in his left LE   Assessment & Plan: Cellulitis of left lower leg - status post left knee replacement 6 days prior to admission Significant swelling and erythema, some purulent drainage in the anterior aspect of the leg.  Will continue vancomycin and add zosyn,dc cefazolin,  continue antibiotics for another 24 hours Orthopedics recommends discharge Blood culture no growth so far MRI of whole left leg due to tense edema and obesity,unable to r/o abscess/myositis Exclude the knee due to scattered images from recent joint replacement  no DVT or SVT noted in the veins of the left lower extremity   CK D stage III, baseline creatinine around 1.7-2 Trial of lasix to improve tense dependent edema in B/L LE   Status post left knee replacement 11/14/16 Pain management as needed Continue physical therapy  PT recommended home health  OSA O2 saturation 89% on room air on admission, likely to be baseline. Chest x-ray negative Continue CPAP at night If hypoxia worsen will consider to rule out PE  Chronic systolic heart failure Seems to be compensated ,except tense edema in the legs , will give lasix 60 mg iv and  repeat ECHO Continue Coreg and Lasix Monitor INO's and daily weights  A. fib currently heart rate well controlled Continue current regimen Patient on Lovenox bridge, change to therapeutic dose and continue monitoring INR INR subtherapeutic today  Hypertension BP stable Continue current regimen   DVT prophylaxis: Lovenox and Coumadin Code Status: (Full code Family Communication: Family at bedside Disposition Plan: Home with home health when medically stable  Consultants:   Orthopedic surgery  Procedures:   None  Antimicrobials:  None   Objective: Vitals:   11/22/16 1700 11/22/16 2045 11/23/16 0153 11/23/16 0509  BP:  132/74  (!) 127/58  Pulse:  84 89 86  Resp:  18 16 18   Temp: 99.4 F (37.4 C) 99.6 F (37.6 C)  98.2 F (36.8 C)  TempSrc: Oral Oral  Oral  SpO2:  96% 95% 94%    Intake/Output Summary (Last 24 hours) at 11/23/16 0856 Last data filed at 11/23/16 0824  Gross per 24 hour  Intake             1520 ml  Output                0 ml  Net             1520 ml   There were no vitals filed for this visit.  Examination:  General exam: Appears calm and comfortable  HEENT: OP moist and clear Respiratory system: Clear to auscultation. No wheezes,crackle or rhonchi Cardiovascular system: S1 & S2 heard, RRR. No JVD, murmurs, rubs or gallops Gastrointestinal system: Abdomen is nondistended, soft and nontender. No organomegaly  or masses felt. Normal bowel sounds heard. Central nervous system: Alert and oriented. No focal neurological deficits. Extremities: LLE erythematous, swollen and tense. Some scattered blisters, and 1 open blister with purulent drainage   Skin: LLE erythematous, swollen and tense. Some scattered blisters,in left LLE  drainage  Psychiatry: Judgement and insight appear normal. Mood & affect appropriate.    Data Reviewed: I have personally reviewed following labs and imaging studies  CBC:  Recent Labs Lab 11/17/16 0522 11/21/16 1120  11/22/16 0537 11/23/16 0624  WBC 10.9* 10.3 9.6 9.0  NEUTROABS  --  7.3  --   --   HGB 11.2* 10.4* 9.7* 10.2*  HCT 35.6* 32.2* 30.1* 32.8*  MCV 96.2 93.9 93.8 94.5  PLT 139* 206 214 509   Basic Metabolic Panel:  Recent Labs Lab 11/21/16 1120 11/22/16 0537 11/23/16 0624  NA 132* 135 133*  K 4.4 4.1 3.9  CL 95* 97* 95*  CO2 27 27 29   GLUCOSE 203* 234* 129*  BUN 48* 43* 43*  CREATININE 1.98* 1.90* 2.16*  CALCIUM 8.3* 8.0* 8.0*   GFR: Estimated Creatinine Clearance: 35.4 mL/min (A) (by C-G formula based on SCr of 2.16 mg/dL (H)). Liver Function Tests:  Recent Labs Lab 11/21/16 1120  AST 33  ALT 40  ALKPHOS 80  BILITOT 1.1  PROT 6.3*  ALBUMIN 2.9*   No results for input(s): LIPASE, AMYLASE in the last 168 hours. No results for input(s): AMMONIA in the last 168 hours. Coagulation Profile:  Recent Labs Lab 11/17/16 0522 11/21/16 1220 11/22/16 0537 11/23/16 0624  INR 1.37 1.44 1.67 1.48   Cardiac Enzymes: No results for input(s): CKTOTAL, CKMB, CKMBINDEX, TROPONINI in the last 168 hours. BNP (last 3 results) No results for input(s): PROBNP in the last 8760 hours. HbA1C:  Recent Labs  11/22/16 0537  HGBA1C 8.1*   CBG:  Recent Labs Lab 11/22/16 0605 11/22/16 1133 11/22/16 1636 11/22/16 2105 11/23/16 0614  GLUCAP 224* 228* 302* 242* 143*   Lipid Profile: No results for input(s): CHOL, HDL, LDLCALC, TRIG, CHOLHDL, LDLDIRECT in the last 72 hours. Thyroid Function Tests: No results for input(s): TSH, T4TOTAL, FREET4, T3FREE, THYROIDAB in the last 72 hours. Anemia Panel: No results for input(s): VITAMINB12, FOLATE, FERRITIN, TIBC, IRON, RETICCTPCT in the last 72 hours. Sepsis Labs:  Recent Labs Lab 11/21/16 1128  LATICACIDVEN 1.03    Recent Results (from the past 240 hour(s))  Culture, blood (routine x 2)     Status: None (Preliminary result)   Collection Time: 11/21/16  5:21 PM  Result Value Ref Range Status   Specimen Description BLOOD  LEFT ANTECUBITAL  Final   Special Requests IN PEDIATRIC BOTTLE Blood Culture adequate volume  Final   Culture NO GROWTH < 24 HOURS  Final   Report Status PENDING  Incomplete  Culture, blood (routine x 2)     Status: None (Preliminary result)   Collection Time: 11/21/16  5:21 PM  Result Value Ref Range Status   Specimen Description BLOOD RIGHT ANTECUBITAL  Final   Special Requests IN PEDIATRIC BOTTLE Blood Culture adequate volume  Final   Culture NO GROWTH < 24 HOURS  Final   Report Status PENDING  Incomplete         Radiology Studies: Dg Chest Port 1 View  Result Date: 11/21/2016 CLINICAL DATA:  Patient complains of pain and blisters to left lower leg since knee replacement last Monday. Also BS running in 300's this am; pt denies SOB or CP. Hx of DM,  HTN, and CHF; former smoker EXAM: PORTABLE CHEST 1 VIEW COMPARISON:  03/13/2016 FINDINGS: Cardiac silhouette is normal in size. No mediastinal or hilar masses. No convincing adenopathy. Clear lungs.  No pleural effusion or pneumothorax. Skeletal structures are demineralized but intact. IMPRESSION: No active disease. Electronically Signed   By: Lajean Manes M.D.   On: 11/21/2016 16:53      Scheduled Meds: . atorvastatin  20 mg Oral Daily  . carvedilol  25 mg Oral BID WC  . cholecalciferol  2,000 Units Oral Daily  . digoxin  0.125 mg Oral QODAY  . enoxaparin  30 mg Subcutaneous Q24H  . furosemide  60 mg Oral Daily  . insulin aspart  0-5 Units Subcutaneous QHS  . insulin aspart  0-9 Units Subcutaneous TID WC  . insulin aspart  3 Units Subcutaneous TID WC  . insulin glargine  70 Units Subcutaneous QHS  . potassium chloride  10 mEq Oral Daily  . senna-docusate  2-3 tablet Oral QHS  . sodium chloride flush  3 mL Intravenous Q12H  . Warfarin - Pharmacist Dosing Inpatient   Does not apply q1800   Continuous Infusions: .  ceFAZolin (ANCEF) IV 2 g (11/23/16 0535)  . vancomycin Stopped (11/22/16 1317)     LOS: 2 days    Time spent:  Total of 25 minutes spent with pt, greater than 50% of which was spent in discussion of  treatment, counseling and coordination of care    Reyne Dumas, MD Pager: Text Page via [email or web address]     If 7PM-7AM, please contact night-coverage [email or web address] Password [facility] 11/23/2016, 8:56 AM

## 2016-11-23 NOTE — Progress Notes (Signed)
Physical Therapy Treatment Patient Details Name: Dennis Williams MRN: [id number] DOB: [birth year] Today's Date: 11/23/2016    History of Present Illness  Pt is a 54 male s/p L TKA on 6/18 admitted with infection and L LE cellulitis with blisters. PMH: CHF, HTN, AFIB. PSH: back surgery.    PT Comments    Pt is limited by back pain that prevents him from increasing ambulation distance at this time. He is slowly progressing towards goals. He should benefit from continued skilled PT to achieve unmet goals and increase functional independence.   Follow Up Recommendations  Home health PT;Supervision/Assistance - 24 hour     Equipment Recommendations  None recommended by PT    Recommendations for Other Services       Precautions / Restrictions Precautions Precautions: Fall;Knee Precaution Booklet Issued: No Precaution Comments: Bring chair for ambulation. Pt back pain cause him to need to sit quickly. Family present and able to follow. Restrictions Weight Bearing Restrictions: Yes LLE Weight Bearing: Weight bearing as tolerated    Mobility  Bed Mobility               General bed mobility comments: in chair on arrival  Transfers Overall transfer level: Needs assistance Equipment used: Rolling walker (2 wheeled) Transfers: Sit to/from Stand Sit to Stand: Supervision         General transfer comment: Supervision for safety.  Ambulation/Gait Ambulation/Gait assistance: Min guard Ambulation Distance (Feet): 50 Feet Assistive device: Rolling walker (2 wheeled) Gait Pattern/deviations: Step-to pattern;Antalgic;Trunk flexed;Decreased stride length Gait velocity: slow Gait velocity interpretation: Below normal speed for age/gender General Gait Details: pt with increased dependence on bilat UEs, pt c/o "Its my back that gives out." very antaglic gait due to back pain and L knee pain   Stairs            Wheelchair Mobility    Modified Rankin (Stroke Patients  Only)       Balance Overall balance assessment: Needs assistance Sitting-balance support: No upper extremity supported;Feet supported Sitting balance-Leahy Scale: Good     Standing balance support: During functional activity Standing balance-Leahy Scale: Poor Standing balance comment: dependent on RW for amb                            Cognition Arousal/Alertness: Awake/alert Behavior During Therapy: WFL for tasks assessed/performed Overall Cognitive Status: Within Functional Limits for tasks assessed                                        Exercises Total Joint Exercises Quad Sets: AROM;Left;10 reps;Seated Heel Slides: AROM;Left;10 reps;Seated Hip ABduction/ADduction: AAROM;Left;10 reps;Seated Long Arc Quad: AROM;Left;10 reps;Seated    General Comments General comments (skin integrity, edema, etc.): Redness and inflammation on LLE. Many blisters with dressings over them      Pertinent Vitals/Pain Pain Assessment: Faces Faces Pain Scale: Hurts even more Pain Location: left knee, Low back with ambulation Pain Descriptors / Indicators: Aching;Sore;Grimacing Pain Intervention(s): Monitored during session;Limited activity within patient's tolerance    Home Living                      Prior Function            PT Goals (current goals can now be found in the care plan section) Acute Rehab PT Goals Patient Stated Goal: not to  loose my leg PT Goal Formulation: With patient Time For Goal Achievement: 11/29/16 Potential to Achieve Goals: Fair Progress towards PT goals: Progressing toward goals    Frequency    7X/week      PT Plan Current plan remains appropriate    Co-evaluation              AM-PAC PT "6 Clicks" Daily Activity  Outcome Measure  Difficulty turning over in bed (including adjusting bedclothes, sheets and blankets)?: A Little Difficulty moving from lying on back to sitting on the side of the bed? : A  Little Difficulty sitting down on and standing up from a chair with arms (e.g., wheelchair, bedside commode, etc,.)?: A Little Help needed moving to and from a bed to chair (including a wheelchair)?: A Little Help needed walking in hospital room?: A Little Help needed climbing 3-5 steps with a railing? : A Little 6 Click Score: 18    End of Session Equipment Utilized During Treatment: Gait belt Activity Tolerance: Patient tolerated treatment well;Patient limited by pain (LBP prevents inceased ambulation distance) Patient left: in chair;with call bell/phone within reach;with family/visitor present Nurse Communication: Mobility status PT Visit Diagnosis: Unsteadiness on feet (R26.81);Muscle weakness (generalized) (M62.81);Pain Pain - Right/Left: Left Pain - part of body: Knee     Time: 8833-7445 PT Time Calculation (min) (ACUTE ONLY): 27 min  Charges:  $Gait Training: 8-22 mins $Therapeutic Exercise: 8-22 mins                    G Codes:       Benjiman Core, [place] Pager [phone] Acute Rehab    Allena Katz 11/23/2016, 12:58 PM

## 2016-11-23 NOTE — ED Notes (Signed)
Patient is stable and ready to be transport to the floor at this time.  Report was called to 5N RN.  Belongings taken with the patient to the floor. Patient taken to floor on Telemetry

## 2016-11-23 NOTE — Progress Notes (Signed)
Inpatient Diabetes Program Recommendations  AACE/ADA: New Consensus Statement on Inpatient Glycemic Control (2015)  Target Ranges:  Prepandial:   less than 140 mg/dL      Peak postprandial:   less than 180 mg/dL (1-2 hours)      Critically ill patients:  140 - 180 mg/dL   Results for MATTIX, IMHOF (MRN [id number]) as of 11/23/2016 12:34  Ref. Range 11/22/2016 06:05 11/22/2016 11:33 11/22/2016 16:36 11/22/2016 21:05  Glucose-Capillary Latest Ref Range: 65 - 99 mg/dL 224 (H) 228 (H) 302 (H) 242 (H)   Results for RODRICK, PAYSON (MRN [id number]) as of 11/23/2016 12:34  Ref. Range 11/23/2016 06:14 11/23/2016 11:33  Glucose-Capillary Latest Ref Range: 65 - 99 mg/dL 143 (H) 223 (H)    Home DM Meds: Lantus 70 units QHS       Humalog 22 units TID  Current Insulin Orders: Lantus 70 units QHS      Novolog Sensitive Correction Scale/ SSI (0-9 units) TID AC + HS      Novolog 3 units TID      MD- Please consider increasing Novolog Meal Coverage to: Novolog 6 units TID with meals (hold if pt eats <50% of meal)      --Will follow patient during hospitalization--  Wyn Quaker RN, MSN, CDE Diabetes Coordinator Inpatient Glycemic Control Team Team Pager: [phone] (8a-5p)

## 2016-11-23 NOTE — Progress Notes (Signed)
ANTICOAGULATION CONSULT NOTE - FOLLOW UP  Pharmacy Consult:  Lovenox / Coumadin Indication: atrial fibrillation  Allergies  Allergen Reactions  . Dilaudid [Hydromorphone Hcl] Nausea And Vomiting and Other (See Comments)    Felt hot  . No Known Allergies     Vital Signs: Temp: 98.2 F (36.8 C) (06/26 0509) Temp Source: Oral (06/26 0509) BP: 127/58 (06/26 0509) Pulse Rate: 86 (06/26 0509)  Labs:  Recent Labs  11/21/16 1120 11/21/16 1220 11/22/16 0537 11/23/16 0624  HGB 10.4*  --  9.7* 10.2*  HCT 32.2*  --  30.1* 32.8*  PLT 206  --  214 241  LABPROT  --  17.7* 19.9* 18.1*  INR  --  1.44 1.67 1.48  CREATININE 1.98*  --  1.90* 2.16*    Estimated Creatinine Clearance: 35.4 mL/min (A) (by C-G formula based on SCr of 2.16 mg/dL (H)).   Assessment: 35 YOM with history of Afib to continue on Coumadin and increase Lovenox bridge to full dose.  INR sub-therapeutic and trended down; no bleeding reported.  Patient's SCr is fluctuating and currently on the rise. His CrCL is borderline for Lovenox adjustment.  Home Coumadin dose: 3.5mg  alternating with 4mg    Goal of Therapy:  INR 2-3 Anti-Xa level 0.6-1 units/ml 4hrs after LMWH dose given Monitor platelets by anticoagulation protocol: Yes    Plan:  - Repeat Coumadin 6mg  PO today - Increase Lovenox to 125mg  SQ Q24H.  May need to check anti-Xa level at Css given CKD.  - Daily PT / INR - CBC Q72H while on Lovenox   Lannah Koike D. Mina Marble, PharmD, BCPS Pager:  [phone] 11/23/2016, 9:26 AM

## 2016-11-23 NOTE — Progress Notes (Signed)
Pt states that he "wants to hold off on his CPAP for the night". Pt states having a lot of pain in his left leg.

## 2016-11-23 NOTE — Progress Notes (Signed)
Pharmacy Antibiotic Note  Dennis Williams is a 83 y.o. male admitted on 11/21/2016 with cellulitis.  Pharmacy has been consulted for vancomycin and Zosyn dosing.   Vancomycin goal trough 10-41mcg/mL. SCr increasing- up to 2.16  Plan: - Vancomycin 1500mg  IV q24 - Zosyn 3.375g IV q8h EI starting at 2200 - Follow renal function closely, level if renal function continues to increase - Follow LOT and ability to de-escalate broad-spectrum antibiotics    Temp (24hrs), Avg:98.9 F (37.2 C), Min:98.2 F (36.8 C), Max:99.6 F (37.6 C)   Recent Labs Lab 11/17/16 0522 11/21/16 1120 11/21/16 1128 11/22/16 0537 11/23/16 0624  WBC 10.9* 10.3  --  9.6 9.0  CREATININE  --  1.98*  --  1.90* 2.16*  LATICACIDVEN  --   --  1.03  --   --     Estimated Creatinine Clearance: 35.4 mL/min (A) (by C-G formula based on SCr of 2.16 mg/dL (H)).    Allergies  Allergen Reactions  . Dilaudid [Hydromorphone Hcl] Nausea And Vomiting and Other (See Comments)    Felt hot  . No Known Allergies     Antimicrobials this admission: Vancomycin 6/24 >> Cefazolin 6/24 >> 6/26 Ceftriaxone 6/25 x1 Zosyn 6/26>>  Dose adjustments this admission: n/a  Microbiology results: 6/24 Bcx: ngtd  Thank you for allowing pharmacy to be a part of this patient's care.  Marialy Urbanczyk D. Zahari Xiang, PharmD, BCPS Clinical Pharmacist [phone] 11/23/2016 6:24 PM

## 2016-11-24 ENCOUNTER — Inpatient Hospital Stay (HOSPITAL_COMMUNITY): Payer: Medicare Other

## 2016-11-24 DIAGNOSIS — I5020 Unspecified systolic (congestive) heart failure: Secondary | ICD-10-CM

## 2016-11-24 DIAGNOSIS — R0602 Shortness of breath: Secondary | ICD-10-CM

## 2016-11-24 LAB — CBC
HCT: 32.7 % — ABNORMAL LOW (ref 39.0–52.0)
Hemoglobin: 10.1 g/dL — ABNORMAL LOW (ref 13.0–17.0)
MCH: 29.3 pg (ref 26.0–34.0)
MCHC: 30.9 g/dL (ref 30.0–36.0)
MCV: 94.8 fL (ref 78.0–100.0)
Platelets: 234 10*3/uL (ref 150–400)
RBC: 3.45 MIL/uL — ABNORMAL LOW (ref 4.22–5.81)
RDW: 13.9 % (ref 11.5–15.5)
WBC: 9.5 10*3/uL (ref 4.0–10.5)

## 2016-11-24 LAB — ECHOCARDIOGRAM COMPLETE
AO mean calculated velocity dopler: 118 cm/s
AV Area VTI index: 0.8 cm2/m2
AV Area VTI: 1.95 cm2
AV Area mean vel: 1.9 cm2
AV Mean grad: 7 mmHg
AV Peak grad: 13 mmHg
AV VEL mean LVOT/AV: 0.55
AV area mean vel ind: 0.73 cm2/m2
AV peak Index: 0.75
AV pk vel: 183 cm/s
AV vel: 2.09
Ao pk vel: 0.56 m/s
Ao-asc: 38 cm
E decel time: 190 msec
E/e' ratio: 13.5
FS: 23 % — AB (ref 28–44)
IVS/LV PW RATIO, ED: 0.93
LA ID, A-P, ES: 54 mm
LA diam end sys: 54 mm
LA diam index: 2.08 cm/m2
LA vol A4C: 122 ml
LA vol index: 46.5 mL/m2
LA vol: 121 mL
LV E/e' medial: 13.5
LV E/e'average: 13.5
LV PW d: 14.9 mm — AB (ref 0.6–1.1)
LV e' LATERAL: 10 cm/s
LVOT SV: 69 mL
LVOT VTI: 19.8 cm
LVOT area: 3.46 cm2
LVOT diameter: 21 mm
LVOT peak VTI: 0.6 cm
LVOT peak vel: 103 cm/s
Lateral S' vel: 10 cm/s
MV Dec: 190
MV Peak grad: 7 mmHg
MV pk E vel: 135 m/s
P 1/2 time: 405 ms
Reg peak vel: 326 cm/s
TAPSE: 23.6 mm
TDI e' lateral: 10
TDI e' medial: 10.4
TR max vel: 326 cm/s
VTI: 32.8 cm
Valve area index: 0.8
Valve area: 2.09 cm2
Weight: 4712 oz

## 2016-11-24 LAB — COMPREHENSIVE METABOLIC PANEL
ALT: 14 U/L — ABNORMAL LOW (ref 17–63)
AST: 22 U/L (ref 15–41)
Albumin: 2.4 g/dL — ABNORMAL LOW (ref 3.5–5.0)
Alkaline Phosphatase: 71 U/L (ref 38–126)
Anion gap: 8 (ref 5–15)
BUN: 44 mg/dL — ABNORMAL HIGH (ref 6–20)
CO2: 31 mmol/L (ref 22–32)
Calcium: 8 mg/dL — ABNORMAL LOW (ref 8.9–10.3)
Chloride: 96 mmol/L — ABNORMAL LOW (ref 101–111)
Creatinine, Ser: 2.38 mg/dL — ABNORMAL HIGH (ref 0.61–1.24)
GFR calc Af Amer: 28 mL/min — ABNORMAL LOW (ref >60)
GFR calc non Af Amer: 24 mL/min — ABNORMAL LOW (ref >60)
Glucose, Bld: 64 mg/dL — ABNORMAL LOW (ref 65–99)
Potassium: 3.7 mmol/L (ref 3.5–5.1)
Sodium: 135 mmol/L (ref 135–145)
Total Bilirubin: 1.2 mg/dL (ref 0.3–1.2)
Total Protein: 5.7 g/dL — ABNORMAL LOW (ref 6.5–8.1)

## 2016-11-24 LAB — GLUCOSE, CAPILLARY
Glucose-Capillary: 81 mg/dL (ref 65–99)
Glucose-Capillary: 228 mg/dL — ABNORMAL HIGH (ref 65–99)
Glucose-Capillary: 142 mg/dL — ABNORMAL HIGH (ref 65–99)
Glucose-Capillary: 180 mg/dL — ABNORMAL HIGH (ref 65–99)

## 2016-11-24 LAB — PROTIME-INR
INR: 1.49
Prothrombin Time: 18.1 seconds — ABNORMAL HIGH (ref 11.4–15.2)

## 2016-11-24 MED ORDER — WARFARIN SODIUM 6 MG PO TABS
7.0000 mg | ORAL_TABLET | Freq: Once | ORAL | Status: AC
  Administered 2016-11-24: 17:00:00 7 mg via ORAL
  Filled 2016-11-24: qty 1

## 2016-11-24 MED ORDER — VANCOMYCIN HCL 10 G IV SOLR
1750.0000 mg | INTRAVENOUS | Status: DC
  Administered 2016-11-25: 1750 mg via INTRAVENOUS
  Filled 2016-11-24: qty 1750

## 2016-11-24 MED ORDER — PERFLUTREN LIPID MICROSPHERE
1.0000 mL | INTRAVENOUS | Status: AC | PRN
  Filled 2016-11-24: qty 10

## 2016-11-24 MED ORDER — FUROSEMIDE 10 MG/ML IJ SOLN
60.0000 mg | Freq: Once | INTRAMUSCULAR | Status: AC
  Administered 2016-11-24: 60 mg via INTRAVENOUS
  Filled 2016-11-24: qty 6

## 2016-11-24 NOTE — Progress Notes (Signed)
ANTICOAGULATION & ANTIBIOTIC CONSULT NOTE - FOLLOW UP  Pharmacy Consult:  Lovenox/Coumadin + Vancomycin/Zosyn Indication: atrial fibrillation + cellulitis  Allergies  Allergen Reactions  . Dilaudid [Hydromorphone Hcl] Nausea And Vomiting and Other (See Comments)    Felt hot  . No Known Allergies     Vital Signs: Temp: 98.4 F (36.9 C) (06/27 0437) Temp Source: Oral (06/27 0437) BP: 123/68 (06/27 0437) Pulse Rate: 77 (06/27 0437)  Labs:  Recent Labs  11/22/16 0537 11/23/16 0624 11/24/16 0523  HGB 9.7* 10.2* 10.1*  HCT 30.1* 32.8* 32.7*  PLT 214 241 234  LABPROT 19.9* 18.1* 18.1*  INR 1.67 1.48 1.49  CREATININE 1.90* 2.16* 2.38*    Estimated Creatinine Clearance: 32.5 mL/min (A) (by C-G formula based on SCr of 2.38 mg/dL (H)).   Assessment: 22 YOM with history of Afib to continue on Coumadin and Lovenox bridge.  INR sub-therapeutic and starting to trend back up post Coumadin 6mg  PO x 3 doses. Patient's SCr is fluctuating and currently on the rise.   Home Coumadin dose: 3.5mg  alternating with 4mg     Pharmacy also managing vancomycin and Zosyn for LLE cellulitis.  Given worsening renal function, will adjust vancomycin.  Patient remains afebrile with WNL WBC.  Vanc 6/24 >> CTX 6/25 >> 6/25 Ancef 6/25 >> 6/26 Zosyn 6/26 >>  6/24 BCx - NGTD   Goal of Therapy:  INR 2-3 Anti-Xa level 0.6-1 units/ml 4hrs after LMWH dose given Monitor platelets by anticoagulation protocol: Yes Vanc trough 10-15 mcg/mL   Plan:  - Coumadin 7mg  PO today - Continue Lovenox 125mg  SQ Q24H.  May need to check anti-Xa level at Css given CKD.  - Daily PT / INR - CBC Q72H while on Lovenox  - Change vanc to 1750mg  IV Q48H, start tomorrow - Zosyn 3.375gm IV Q8H, 4 hr infusion - Monitor renal fxn, clinical progress, vanc trough at Css - F/U vanc LOT   Nilay Mangrum D. Mina Marble, PharmD, BCPS Pager:  [phone] 11/24/2016, 8:58 AM

## 2016-11-24 NOTE — Progress Notes (Signed)
Inpatient Diabetes Program Recommendations  AACE/ADA: New Consensus Statement on Inpatient Glycemic Control (2015)  Target Ranges:  Prepandial:   less than 140 mg/dL      Peak postprandial:   less than 180 mg/dL (1-2 hours)      Critically ill patients:  140 - 180 mg/dL   Results for Dennis Williams, Dennis Williams (MRN [id number]) as of 11/24/2016 12:33  Ref. Range 11/23/2016 06:14 11/23/2016 11:33 11/23/2016 17:06 11/23/2016 21:32  Glucose-Capillary Latest Ref Range: 65 - 99 mg/dL 143 (H) 223 (H) 215 (H) 141 (H)   Results for Dennis Williams, Dennis Williams (MRN [id number]) as of 11/24/2016 12:33  Ref. Range 11/24/2016 06:19 11/24/2016 11:37  Glucose-Capillary Latest Ref Range: 65 - 99 mg/dL 81 228 (H)    Home DM Meds: Lantus 70 units QHS                             Humalog 22 units TID  Current Insulin Orders: Lantus 70 units QHS                                       Novolog Sensitive Correction Scale/ SSI (0-9 units) TID AC + HS                                       Novolog 3 units TID      MD- Please consider increasing Novolog Meal Coverage to: Novolog 6 units TID with meals (hold if pt eats <50% of meal)     --Will follow patient during hospitalization--  Wyn Quaker RN, MSN, CDE Diabetes Coordinator Inpatient Glycemic Control Team Team Pager: [phone] (8a-5p)

## 2016-11-24 NOTE — Progress Notes (Signed)
  Echocardiogram 2D Echocardiogram has been performed.  Kano Heckmann G Khing Belcher 11/24/2016, 11:26 AM

## 2016-11-24 NOTE — Progress Notes (Signed)
Patient given pain medication at 2010 last night; he was educated and refused cpm.  Patient was sleep when checked on by RN.  Patient was asleep this morning when RN woke patient to ask if he needed pain med.  Patient stated "yes" and was given pain med at 0600 this morning.  Patient is refusing cpm again this morning using the excuse of pain.

## 2016-11-24 NOTE — Progress Notes (Signed)
RT placed patient on CPAP. Patient tolerating well at this time. RT will monitor as needed. 

## 2016-11-24 NOTE — Progress Notes (Signed)
PROGRESS NOTE [facility]   JENNIE BOLAR   HER:[id number] DOB: 04-06-1934  DOA: 11/21/2016 PCP: Myrlene Broker, MD   Brief Narrative:  [name]   83 year old male with medical history significant for diabetes, chronic kidney disease, hypertension, A. fib on Coumadin, 08 of left knee, status post left knee replacement 6 days prior to admission. Presented to the emergency department complaining of left leg pain and swelling on evaluation revealed left lower leg cellulitis with open blister and purulent drainage. Upon ED evaluation patient was found to be hypoxemic 89% on room air. Patient admitted for IV antibiotics and further evaluation. Doppler of the leg negative for DVT  Subjective: Tense edema of the legs slightly improved , afib controlled on tele    Assessment & Plan: Cellulitis of left lower leg - status post left knee replacement 6 days prior to admission Significant edema of the legs likely contributing to the cellulitis, also has some purulent drainage in the anterior aspect of the leg from an open blister.  Will continue vancomycin and zosyn, for now,    Orthopedics recommends -ID consult and diuresis Blood culture no growth so far MRI of whole left leg due to tense edema and obesity pending,unable to r/o abscess/myositis on physical exam Excluded the left knee due to scattered images from recent joint replacement  no DVT or SVT noted in the veins of the left lower extremity   CK D stage III, baseline creatinine around 1.7-2 Trial of lasix to improve tense dependent edema in B/L LE  Creatinine slightly up from baseline today 2.38  Status post left knee replacement 11/14/16 Pain management as needed Continue physical therapy PT recommended home health  OSA-followed by Dr. Baird Lyons O2 saturation 89% on room air on admission, likely to be baseline. Chest x-ray negative Continue CPAP at night If hypoxia worsen will consider to rule out  PE  Chronic systolic heart failure, obtain records from cardiologist who cleared him prior to needle placement Seems to be compensated ,except tense edema in the legs , continue lasix 60 mg iv    ECHO to evaluate EF Continue Coreg and Lasix Monitor INO's and daily weights  patient is followed by Dr. Jenne Campus in [place]   A. fib currently heart rate well controlled Continue current regimen Patient on Lovenox bridge, change to therapeutic dose and continue monitoring INR INR subtherapeutic today  Hypertension BP stable Continue current regimen   DVT prophylaxis: Lovenox and Coumadin Code Status: (Full code Family Communication: Family at bedside Disposition Plan: Home with home health when medically stable  Consultants:   Orthopedic surgery  Procedures:   None  Antimicrobials:  None   Objective: Vitals:   11/23/16 0509 11/23/16 2018 11/24/16 0437 11/24/16 0440  BP: (!) 127/58 124/62 123/68   Pulse: 86 92 77   Resp: 18 18 18    Temp: 98.2 F (36.8 C) 99 F (37.2 C) 98.4 F (36.9 C)   TempSrc: Oral Oral Oral   SpO2: 94% 96% 94%   Weight:    133.6 kg (294 lb 8 oz)    Intake/Output Summary (Last 24 hours) at 11/24/16 0911 Last data filed at 11/24/16 0556  Gross per 24 hour  Intake              480 ml  Output              550 ml  Net              -  70 ml   Filed Weights   11/24/16 0440  Weight: 133.6 kg (294 lb 8 oz)    Examination:  General exam: Appears calm and comfortable  HEENT: OP moist and clear Respiratory system: Clear to auscultation. No wheezes,crackle or rhonchi Cardiovascular system: S1 & S2 heard, RRR. No JVD, murmurs, rubs or gallops Gastrointestinal system: Abdomen is nondistended, soft and nontender. No organomegaly or masses felt. Normal bowel sounds heard. Central nervous system: Alert and oriented. No focal neurological deficits. Extremities: Persistent left lower extremity erythema, 2+ pitting edema  1 open blister with  purulent drainage   Skin: LLE erythematous, swollen and tense. Some scattered blisters,in left LLE  drainage  Psychiatry: Judgement and insight appear normal. Mood & affect appropriate.    Data Reviewed: I have personally reviewed following labs and imaging studies  CBC:  Recent Labs Lab 11/21/16 1120 11/22/16 0537 11/23/16 0624 11/24/16 0523  WBC 10.3 9.6 9.0 9.5  NEUTROABS 7.3  --   --   --   HGB 10.4* 9.7* 10.2* 10.1*  HCT 32.2* 30.1* 32.8* 32.7*  MCV 93.9 93.8 94.5 94.8  PLT 206 214 241 967   Basic Metabolic Panel:  Recent Labs Lab 11/21/16 1120 11/22/16 0537 11/23/16 0624 11/24/16 0523  NA 132* 135 133* 135  K 4.4 4.1 3.9 3.7  CL 95* 97* 95* 96*  CO2 27 27 29 31   GLUCOSE 203* 234* 129* 64*  BUN 48* 43* 43* 44*  CREATININE 1.98* 1.90* 2.16* 2.38*  CALCIUM 8.3* 8.0* 8.0* 8.0*   GFR: Estimated Creatinine Clearance: 32.5 mL/min (A) (by C-G formula based on SCr of 2.38 mg/dL (H)). Liver Function Tests:  Recent Labs Lab 11/21/16 1120 11/24/16 0523  AST 33 22  ALT 40 14*  ALKPHOS 80 71  BILITOT 1.1 1.2  PROT 6.3* 5.7*  ALBUMIN 2.9* 2.4*   No results for input(s): LIPASE, AMYLASE in the last 168 hours. No results for input(s): AMMONIA in the last 168 hours. Coagulation Profile:  Recent Labs Lab 11/21/16 1220 11/22/16 0537 11/23/16 0624 11/24/16 0523  INR 1.44 1.67 1.48 1.49   Cardiac Enzymes: No results for input(s): CKTOTAL, CKMB, CKMBINDEX, TROPONINI in the last 168 hours. BNP (last 3 results) No results for input(s): PROBNP in the last 8760 hours. HbA1C:  Recent Labs  11/22/16 0537  HGBA1C 8.1*   CBG:  Recent Labs Lab 11/23/16 0614 11/23/16 1133 11/23/16 1706 11/23/16 2132 11/24/16 0619  GLUCAP 143* 223* 215* 141* 81   Lipid Profile: No results for input(s): CHOL, HDL, LDLCALC, TRIG, CHOLHDL, LDLDIRECT in the last 72 hours. Thyroid Function Tests: No results for input(s): TSH, T4TOTAL, FREET4, T3FREE, THYROIDAB in the last 72  hours. Anemia Panel: No results for input(s): VITAMINB12, FOLATE, FERRITIN, TIBC, IRON, RETICCTPCT in the last 72 hours. Sepsis Labs:  Recent Labs Lab 11/21/16 1128  LATICACIDVEN 1.03    Recent Results (from the past 240 hour(s))  Culture, blood (routine x 2)     Status: None (Preliminary result)   Collection Time: 11/21/16  5:21 PM  Result Value Ref Range Status   Specimen Description BLOOD LEFT ANTECUBITAL  Final   Special Requests IN PEDIATRIC BOTTLE Blood Culture adequate volume  Final   Culture NO GROWTH 2 DAYS  Final   Report Status PENDING  Incomplete  Culture, blood (routine x 2)     Status: None (Preliminary result)   Collection Time: 11/21/16  5:21 PM  Result Value Ref Range Status   Specimen Description BLOOD RIGHT ANTECUBITAL  Final   Special Requests IN PEDIATRIC BOTTLE Blood Culture adequate volume  Final   Culture NO GROWTH 2 DAYS  Final   Report Status PENDING  Incomplete         Radiology Studies: No results found.    Scheduled Meds: . atorvastatin  20 mg Oral Daily  . carvedilol  25 mg Oral BID WC  . cholecalciferol  2,000 Units Oral Daily  . digoxin  0.125 mg Oral QODAY  . enoxaparin (LOVENOX) injection  125 mg Subcutaneous Q24H  . insulin aspart  0-5 Units Subcutaneous QHS  . insulin aspart  0-9 Units Subcutaneous TID WC  . insulin aspart  3 Units Subcutaneous TID WC  . insulin glargine  70 Units Subcutaneous QHS  . potassium chloride  10 mEq Oral Daily  . senna-docusate  2-3 tablet Oral QHS  . sodium chloride flush  3 mL Intravenous Q12H  . warfarin  7 mg Oral ONCE-1800  . Warfarin - Pharmacist Dosing Inpatient   Does not apply q1800   Continuous Infusions: . piperacillin-tazobactam (ZOSYN)  IV 3.375 g (11/24/16 0551)  . [START ON 11/25/2016] vancomycin       LOS: 3 days    Time spent: Total of 25 minutes spent with pt, greater than 50% of which was spent in discussion of  treatment, counseling and coordination of care    Reyne Dumas, MD Pager: Text Page via [email or web address]     If 7PM-7AM, please contact night-coverage [email or web address] Password [email or web address] 11/24/2016, 9:11 AM

## 2016-11-24 NOTE — Progress Notes (Signed)
Physical Therapy Treatment Patient Details Name: Dennis Williams MRN: [id number] DOB: [birth year] Today's Date: 11/24/2016    History of Present Illness  Pt is a 37 male s/p L TKA on 6/18 admitted with infection and L LE cellulitis with blisters. PMH: CHF, HTN, AFIB. PSH: back surgery.    PT Comments    Pt progressed with mobility today, ambulated 100' with min-guard A and less limited by back pain today. Left in CPM 0-80 degrees. Pt anxious about discoloration of lower left leg. PT will continue to follow.   Follow Up Recommendations  Home health PT;Supervision/Assistance - 24 hour     Equipment Recommendations  None recommended by PT    Recommendations for Other Services       Precautions / Restrictions Precautions Precautions: Fall;Knee Precaution Booklet Issued: No Restrictions Weight Bearing Restrictions: Yes LLE Weight Bearing: Weight bearing as tolerated    Mobility  Bed Mobility Overal bed mobility: Needs Assistance Bed Mobility: Sit to Supine       Sit to supine: Min assist   General bed mobility comments: chair upon arrival. Min A for LLE for return to bed and position in CPM  Transfers Overall transfer level: Needs assistance Equipment used: Rolling walker (2 wheeled) Transfers: Sit to/from Stand Sit to Stand: Supervision         General transfer comment: Supervision for safety. Had more difficulty standing after having ambulated, increased time needed and close guarding  Ambulation/Gait Ambulation/Gait assistance: Min guard Ambulation Distance (Feet): 100 Feet Assistive device: Rolling walker (2 wheeled) Gait Pattern/deviations: Antalgic;Trunk flexed;Decreased stride length;Step-through pattern Gait velocity: decreased Gait velocity interpretation: <1.8 ft/sec, indicative of risk for recurrent falls General Gait Details: stiff kneed gait in beginning but left knee flexion increased with increased distance   Stairs             Wheelchair Mobility    Modified Rankin (Stroke Patients Only)       Balance Overall balance assessment: Needs assistance Sitting-balance support: No upper extremity supported;Feet supported Sitting balance-Leahy Scale: Good     Standing balance support: During functional activity Standing balance-Leahy Scale: Poor Standing balance comment: dependent on RW for amb                            Cognition Arousal/Alertness: Awake/alert Behavior During Therapy: WFL for tasks assessed/performed Overall Cognitive Status: Within Functional Limits for tasks assessed                                        Exercises Total Joint Exercises Ankle Circles/Pumps: AROM;Left;10 reps;Seated Quad Sets: AROM;10 reps;Seated;Both Short Arc Quad: AROM;Left;10 reps;Supine Long Arc Quad: AROM;Left;10 reps;Seated Knee Flexion: AROM;Left;10 reps;Seated Goniometric ROM: 10-80    General Comments General comments (skin integrity, edema, etc.): left lower leg very dark and pt very concerned about it       Pertinent Vitals/Pain Pain Assessment: 0-10 Pain Score: 7  Pain Location: left knee, Low back with ambulation Pain Descriptors / Indicators: Aching;Sore;Grimacing Pain Intervention(s): Limited activity within patient's tolerance;Monitored during session;Premedicated before session    Home Living                      Prior Function            PT Goals (current goals can now be found in the care plan section) Acute  Rehab PT Goals Patient Stated Goal: not to loose my leg PT Goal Formulation: With patient Time For Goal Achievement: 11/29/16 Potential to Achieve Goals: Fair Progress towards PT goals: Progressing toward goals    Frequency    7X/week      PT Plan Current plan remains appropriate    Co-evaluation              AM-PAC PT "6 Clicks" Daily Activity  Outcome Measure  Difficulty turning over in bed (including adjusting  bedclothes, sheets and blankets)?: A Little Difficulty moving from lying on back to sitting on the side of the bed? : A Little Difficulty sitting down on and standing up from a chair with arms (e.g., wheelchair, bedside commode, etc,.)?: A Little Help needed moving to and from a bed to chair (including a wheelchair)?: A Little Help needed walking in hospital room?: A Little Help needed climbing 3-5 steps with a railing? : A Little 6 Click Score: 18    End of Session Equipment Utilized During Treatment: Gait belt Activity Tolerance: Patient tolerated treatment well Patient left: with call bell/phone within reach;in bed;in CPM Nurse Communication: Mobility status PT Visit Diagnosis: Unsteadiness on feet (R26.81);Muscle weakness (generalized) (M62.81);Pain Pain - Right/Left: Left Pain - part of body: Knee     Time: 5188-4166 PT Time Calculation (min) (ACUTE ONLY): 35 min  Charges:  $Gait Training: 23-37 mins                    G Codes:       [place] 11/24/2016, 3:21 PM

## 2016-11-25 ENCOUNTER — Inpatient Hospital Stay (HOSPITAL_COMMUNITY): Payer: Medicare Other

## 2016-11-25 DIAGNOSIS — K5900 Constipation, unspecified: Secondary | ICD-10-CM

## 2016-11-25 DIAGNOSIS — E0800 Diabetes mellitus due to underlying condition with hyperosmolarity without nonketotic hyperglycemic-hyperosmolar coma (NKHHC): Secondary | ICD-10-CM

## 2016-11-25 LAB — COMPREHENSIVE METABOLIC PANEL
ALT: 10 U/L — ABNORMAL LOW (ref 17–63)
AST: 18 U/L (ref 15–41)
Albumin: 2.5 g/dL — ABNORMAL LOW (ref 3.5–5.0)
Alkaline Phosphatase: 69 U/L (ref 38–126)
Anion gap: 8 (ref 5–15)
BUN: 45 mg/dL — ABNORMAL HIGH (ref 6–20)
CO2: 31 mmol/L (ref 22–32)
Calcium: 8.3 mg/dL — ABNORMAL LOW (ref 8.9–10.3)
Chloride: 96 mmol/L — ABNORMAL LOW (ref 101–111)
Creatinine, Ser: 2.35 mg/dL — ABNORMAL HIGH (ref 0.61–1.24)
GFR calc Af Amer: 28 mL/min — ABNORMAL LOW (ref >60)
GFR calc non Af Amer: 24 mL/min — ABNORMAL LOW (ref >60)
Glucose, Bld: 71 mg/dL (ref 65–99)
Potassium: 3.6 mmol/L (ref 3.5–5.1)
Sodium: 135 mmol/L (ref 135–145)
Total Bilirubin: 1.3 mg/dL — ABNORMAL HIGH (ref 0.3–1.2)
Total Protein: 6 g/dL — ABNORMAL LOW (ref 6.5–8.1)

## 2016-11-25 LAB — PROTIME-INR
INR: 1.63
Prothrombin Time: 19.5 seconds — ABNORMAL HIGH (ref 11.4–15.2)

## 2016-11-25 LAB — GLUCOSE, CAPILLARY
Glucose-Capillary: 108 mg/dL — ABNORMAL HIGH (ref 65–99)
Glucose-Capillary: 143 mg/dL — ABNORMAL HIGH (ref 65–99)
Glucose-Capillary: 164 mg/dL — ABNORMAL HIGH (ref 65–99)
Glucose-Capillary: 117 mg/dL — ABNORMAL HIGH (ref 65–99)

## 2016-11-25 MED ORDER — FUROSEMIDE 10 MG/ML IJ SOLN: 60.0000 mg | Freq: Two times a day (BID) | INTRAMUSCULAR | Status: DC

## 2016-11-25 MED ORDER — COLLAGENASE 250 UNIT/GM EX OINT
TOPICAL_OINTMENT | Freq: Every day | CUTANEOUS | Status: DC
  Administered 2016-11-25 – 2016-11-26 (×2): via TOPICAL
  Filled 2016-11-25: qty 30

## 2016-11-25 MED ORDER — FUROSEMIDE 10 MG/ML IJ SOLN: 60.0000 mg | Freq: Once | INTRAMUSCULAR | Status: DC

## 2016-11-25 MED ORDER — WARFARIN SODIUM 6 MG PO TABS
7.0000 mg | ORAL_TABLET | Freq: Once | ORAL | Status: AC
  Administered 2016-11-25: 7 mg via ORAL
  Filled 2016-11-25: qty 1

## 2016-11-25 MED ORDER — FUROSEMIDE 10 MG/ML IJ SOLN
60.0000 mg | Freq: Two times a day (BID) | INTRAMUSCULAR | Status: AC
  Administered 2016-11-25 (×2): 60 mg via INTRAVENOUS
  Filled 2016-11-25 (×2): qty 6

## 2016-11-25 NOTE — Care Management Note (Signed)
Case Management Note  Patient Details  Name: ALFONZA TOFT MRN: [id number] Date of Birth: April 12, 1934  Subjective/Objective:   83 yr old gentleman admitted with cellulitis of left leg. 11/15/16 patient had left total knee arthroplasty.                  Action/Plan:  Patient is active with [facility], will resume [place] at discharge. Has RW and 3in1.    Expected Discharge Date:    pending              Expected Discharge Plan:  [place]  In-House Referral:     Discharge planning Services  CM Consult  Post Acute Care Choice:  [place], Resumption of Svcs/PTA Provider Choice offered to:  Patient  DME Arranged:  N/A DME Agency:     HH Arranged:  PT [place]:  [facility] (formerly [employer])  Status of Service:  In process, will continue to follow  If discussed at Long Length of Stay Meetings, dates discussed:    Additional Comments:  Ninfa Meeker, RN 11/25/2016, 1:25 PM

## 2016-11-25 NOTE — Progress Notes (Signed)
PROGRESS NOTE [facility]   Dennis Williams   HER:[id number] DOB: 26-Jan-1934  DOA: 11/21/2016 PCP: Myrlene Broker, MD   Brief Narrative:  Dennis Williams   83 year old male with medical history significant for diabetes, chronic kidney disease, hypertension, A. fib on Coumadin, 08 of left knee, status post left knee replacement 6 days prior to admission. Presented to the emergency department complaining of left leg pain and swelling on evaluation revealed left lower leg cellulitis with open blister and purulent drainage. Upon ED evaluation patient was found to be hypoxemic 89% on room air. Patient admitted for IV antibiotics and further evaluation. Doppler of the leg negative for DVT   Subjective: Feels that his left leg is less painful , erythema is improving   Assessment & Plan: Cellulitis of left lower leg - status post left knee replacement 6 days prior to admission Significant edema of the legs likely contributing to the cellulitis, also has some purulent drainage in the anterior aspect of the leg from an open blister.  Patient has been on vancomycin and Zosyn since admission Orthopedics recommends -ID consult and diuresis Blood culture no growth so far Awaiting results of MRI of the left leg, if negative and no abscess, will discuss antibiotics with ID and discharge home on PO antibiotics MRSA PCR negative Excluded MRI of  left knee due to scattered images from recent joint replacement  no DVT or SVT noted in the veins of the left lower extremity   CK D stage III, baseline creatinine around 1.7-2 Continue diuresis with Lasix IV Creatinine slightly up from baseline today 2.38  Status post left knee replacement 11/14/16 Pain management as needed Continue physical therapy PT recommended home health  [facility]-followed by Dr. Baird Lyons O2 saturation 89% on room air on admission, likely to be baseline. Chest x-ray negative Continue CPAP at night If hypoxia worsen  will consider to rule out PE  Chronic systolic heart failure, obtain records from cardiologist who cleared him prior to needle placement Seems to be compensated ,except tense edema in the legs , continue lasix 60 mg iv  2 doses ECHO showed EF of 60-65%, moderate pulmonary hypertension likely causing dependent edema Continue Coreg and Lasix Monitor INO's and daily weights  patient is followed by Dr. Jenne Campus in [place]   A. fib currently heart rate well controlled Continue current regimen Patient on Lovenox bridge, change to therapeutic dose and continue monitoring INR INR subtherapeutic today  Hypertension BP stable Continue current regimen   DVT prophylaxis: Lovenox and Coumadin Code Status: (Full code Family Communication: Discussed plan of care with wife and daughter [ via telephone] Disposition Plan: Home with home health when medically stable  Consultants:   Orthopedic surgery  Procedures:   None  Antimicrobials:  None   Objective: Vitals:   11/24/16 1540 11/24/16 2022 11/24/16 2252 11/25/16 0541  BP: (!) 166/49 114/70  128/62  Pulse: (!) 112 78 75 76  Resp: 18 18 16 16   Temp: 98.8 F (37.1 C) 98.1 F (36.7 C)  98.7 F (37.1 C)  TempSrc: Oral Oral  Oral  SpO2: 98% 99% 98% 99%  Weight:        Intake/Output Summary (Last 24 hours) at 11/25/16 0940 Last data filed at 11/25/16 0600  Gross per 24 hour  Intake              990 ml  Output             1925 ml  Net             -935 ml   Filed Weights   11/24/16 0440  Weight: 133.6 kg (294 lb 8 oz)    Examination:  General exam: Appears calm and comfortable  HEENT: OP moist and clear Respiratory system: Clear to auscultation. No wheezes,crackle or rhonchi Cardiovascular system: S1 & S2 heard, RRR. No JVD, murmurs, rubs or gallops Gastrointestinal system: Abdomen is nondistended, soft and nontender. No organomegaly or masses felt. Normal bowel sounds heard. Central nervous system: Alert and  oriented. No focal neurological deficits. Extremities: Persistent left lower extremity erythema, 2+ pitting edema  1 open blister with purulent drainage   Skin: LLE erythematous, swollen and tense. Some scattered blisters,in left LLE  drainage  Psychiatry: Judgement and insight appear normal. Mood & affect appropriate.    Data Reviewed: I have personally reviewed following labs and imaging studies  CBC:  Recent Labs Lab 11/21/16 1120 11/22/16 0537 11/23/16 0624 11/24/16 0523  WBC 10.3 9.6 9.0 9.5  NEUTROABS 7.3  --   --   --   HGB 10.4* 9.7* 10.2* 10.1*  HCT 32.2* 30.1* 32.8* 32.7*  MCV 93.9 93.8 94.5 94.8  PLT 206 214 241 673   Basic Metabolic Panel:  Recent Labs Lab 11/21/16 1120 11/22/16 0537 11/23/16 0624 11/24/16 0523 11/25/16 0616  NA 132* 135 133* 135 135  K 4.4 4.1 3.9 3.7 3.6  CL 95* 97* 95* 96* 96*  CO2 27 27 29 31 31   GLUCOSE 203* 234* 129* 64* 71  BUN 48* 43* 43* 44* 45*  CREATININE 1.98* 1.90* 2.16* 2.38* 2.35*  CALCIUM 8.3* 8.0* 8.0* 8.0* 8.3*   GFR: Estimated Creatinine Clearance: 33 mL/min (A) (by C-G formula based on SCr of 2.35 mg/dL (H)). Liver Function Tests:  Recent Labs Lab 11/21/16 1120 11/24/16 0523 11/25/16 0616  AST 33 22 18  ALT 40 14* 10*  ALKPHOS 80 71 69  BILITOT 1.1 1.2 1.3*  PROT 6.3* 5.7* 6.0*  ALBUMIN 2.9* 2.4* 2.5*   No results for input(s): LIPASE, AMYLASE in the last 168 hours. No results for input(s): AMMONIA in the last 168 hours. Coagulation Profile:  Recent Labs Lab 11/21/16 1220 11/22/16 0537 11/23/16 0624 11/24/16 0523 11/25/16 0616  INR 1.44 1.67 1.48 1.49 1.63   Cardiac Enzymes: No results for input(s): CKTOTAL, CKMB, CKMBINDEX, TROPONINI in the last 168 hours. BNP (last 3 results) No results for input(s): PROBNP in the last 8760 hours. HbA1C: No results for input(s): HGBA1C in the last 72 hours. CBG:  Recent Labs Lab 11/24/16 0619 11/24/16 1137 11/24/16 1658 11/24/16 2042 11/25/16 0656    GLUCAP 81 228* 142* 180* 108*   Lipid Profile: No results for input(s): CHOL, HDL, LDLCALC, TRIG, CHOLHDL, LDLDIRECT in the last 72 hours. Thyroid Function Tests: No results for input(s): TSH, T4TOTAL, FREET4, T3FREE, THYROIDAB in the last 72 hours. Anemia Panel: No results for input(s): VITAMINB12, FOLATE, FERRITIN, TIBC, IRON, RETICCTPCT in the last 72 hours. Sepsis Labs:  Recent Labs Lab 11/21/16 1128  LATICACIDVEN 1.03    Recent Results (from the past 240 hour(s))  Culture, blood (routine x 2)     Status: None (Preliminary result)   Collection Time: 11/21/16  5:21 PM  Result Value Ref Range Status   Specimen Description BLOOD LEFT ANTECUBITAL  Final   Special Requests IN PEDIATRIC BOTTLE Blood Culture adequate volume  Final   Culture NO GROWTH 3 DAYS  Final   Report Status PENDING  Incomplete  Culture, blood (routine x 2)     Status: None (Preliminary result)   Collection Time: 11/21/16  5:21 PM  Result Value Ref Range Status   Specimen Description BLOOD RIGHT ANTECUBITAL  Final   Special Requests IN PEDIATRIC BOTTLE Blood Culture adequate volume  Final   Culture NO GROWTH 3 DAYS  Final   Report Status PENDING  Incomplete         Radiology Studies: No results found.    Scheduled Meds: . atorvastatin  20 mg Oral Daily  . carvedilol  25 mg Oral BID WC  . cholecalciferol  2,000 Units Oral Daily  . digoxin  0.125 mg Oral QODAY  . enoxaparin (LOVENOX) injection  125 mg Subcutaneous Q24H  . furosemide  60 mg Intravenous BID  . insulin aspart  0-5 Units Subcutaneous QHS  . insulin aspart  0-9 Units Subcutaneous TID WC  . insulin aspart  3 Units Subcutaneous TID WC  . insulin glargine  70 Units Subcutaneous QHS  . potassium chloride  10 mEq Oral Daily  . senna-docusate  2-3 tablet Oral QHS  . sodium chloride flush  3 mL Intravenous Q12H  . Warfarin - Pharmacist Dosing Inpatient   Does not apply q1800   Continuous Infusions: . piperacillin-tazobactam (ZOSYN)   IV 3.375 g (11/25/16 0600)  . vancomycin Stopped (11/25/16 0800)     LOS: 4 days    Time spent: Total of 25 minutes spent with pt, greater than 50% of which was spent in discussion of  treatment, counseling and coordination of care    Reyne Dumas, MD Pager: Text Page via [email or web address]     If 7PM-7AM, please contact night-coverage [email or web address] Password [facility] 11/25/2016, 9:40 AM

## 2016-11-25 NOTE — Progress Notes (Signed)
ANTICOAGULATION CONSULT NOTE - FOLLOW UP  Pharmacy Consult:  Lovenox/Coumadin  Indication: atrial fibrillation  Allergies  Allergen Reactions  . Dilaudid [Hydromorphone Hcl] Nausea And Vomiting and Other (See Comments)    Felt hot  . No Known Allergies     Vital Signs: Temp: 98.7 F (37.1 C) (06/28 0541) Temp Source: Oral (06/28 0541) BP: 128/62 (06/28 0541) Pulse Rate: 76 (06/28 0541)  Labs:  Recent Labs  11/23/16 0624 11/24/16 0523 11/25/16 0616  HGB 10.2* 10.1*  --   HCT 32.8* 32.7*  --   PLT 241 234  --   LABPROT 18.1* 18.1* 19.5*  INR 1.48 1.49 1.63  CREATININE 2.16* 2.38* 2.35*    Estimated Creatinine Clearance: 33 mL/min (A) (by C-G formula based on SCr of 2.35 mg/dL (H)).   Assessment: 26 YOM with history of Afib to continue on Coumadin and Lovenox bridge.  INR sub-therapeutic and starting to trend back up.  Patient's SCr is fluctuating with normalized CrCL < 30 ml/min.  No bleeding reported.  Home Coumadin dose: 3.5mg  alternating with 4mg    Goal of Therapy:  INR 2-3 Anti-Xa level 0.6-1 units/ml 4hrs after LMWH dose given Monitor platelets by anticoagulation protocol: Yes    Plan:  - Repeat Coumadin 7mg  PO today - Continue Lovenox 125mg  SQ Q24H.  May need to check anti-Xa level at [facility] given CKD.  - Daily PT / INR - CBC Q72H while on Lovenox   Karel Turpen D. Mina Marble, PharmD, BCPS Pager:  [phone] 11/25/2016, 10:16 AM

## 2016-11-25 NOTE — Progress Notes (Signed)
PT Cancellation Note  Patient Details Name: Dennis Williams MRN: [id number] DOB: 1934-05-20   Cancelled Treatment:    Reason Eval/Treat Not Completed: Patient at procedure or test/unavailable Pt currently at MRI. Will reattempt as schedule allows.   Leighton Ruff, PT, DPT  Acute Rehabilitation Services  Pager: [phone]    Rudean Hitt 11/25/2016, 3:06 PM

## 2016-11-25 NOTE — Consult Note (Signed)
[facility] Nurse wound consult note Reason for Consult: S/P left knee replacement 7 days ago. Re-admitted with edema and pain to left lower leg.  Has scattered serum filled blisters to anterior and medial lower left leg.  Left lower anterior leg with nonintact blister with ruddy red, friable wound bed. Started on systemic antibiotics and awaiting MRI left leg.  Wound type:  Bullae after left knee replacement.  Pressure Injury POA: N/A Measurement:Anterior left lower leg:  1 cm x 1 cm x 0.2 cm Left medial leg:  3 cm x 0.2 cm intact serum filled blisters Wound FVO:HKGOV red anterior leg Drainage (amount, consistency, odor) Minimal serosanguinous drainage.  Periwound:Edema, surgical site to left knee.  Dressing procedure/placement/frequency:Cleanse nonintact blister to left anterior lower leg with NS and pat gently dry.  Apply Santyl to wound bed.  Cover with NS moist 2x2 gauze and kerlix/tape. Change daily.  All other blisters (intact) cleanse with soap and water and apply silicone border foam dressing.  Change every three days and PRN soilage.  Will not follow at this time.  Please re-consult if needed.  Domenic Moras RN BSN [facility] Pager [phone]

## 2016-11-26 LAB — CULTURE, BLOOD (ROUTINE X 2)
Culture: NO GROWTH
Culture: NO GROWTH
Special Requests: ADEQUATE
Special Requests: ADEQUATE

## 2016-11-26 LAB — CBC
HCT: 33.6 % — ABNORMAL LOW (ref 39.0–52.0)
Hemoglobin: 10.5 g/dL — ABNORMAL LOW (ref 13.0–17.0)
MCH: 29.9 pg (ref 26.0–34.0)
MCHC: 31.3 g/dL (ref 30.0–36.0)
MCV: 95.7 fL (ref 78.0–100.0)
Platelets: 275 10*3/uL (ref 150–400)
RBC: 3.51 MIL/uL — ABNORMAL LOW (ref 4.22–5.81)
RDW: 14.2 % (ref 11.5–15.5)
WBC: 8.4 10*3/uL (ref 4.0–10.5)

## 2016-11-26 LAB — COMPREHENSIVE METABOLIC PANEL
ALT: 12 U/L — ABNORMAL LOW (ref 17–63)
AST: 22 U/L (ref 15–41)
Albumin: 2.7 g/dL — ABNORMAL LOW (ref 3.5–5.0)
Alkaline Phosphatase: 79 U/L (ref 38–126)
Anion gap: 9 (ref 5–15)
BUN: 41 mg/dL — ABNORMAL HIGH (ref 6–20)
CO2: 29 mmol/L (ref 22–32)
Calcium: 8.4 mg/dL — ABNORMAL LOW (ref 8.9–10.3)
Chloride: 98 mmol/L — ABNORMAL LOW (ref 101–111)
Creatinine, Ser: 2.37 mg/dL — ABNORMAL HIGH (ref 0.61–1.24)
GFR calc Af Amer: 28 mL/min — ABNORMAL LOW (ref >60)
GFR calc non Af Amer: 24 mL/min — ABNORMAL LOW (ref >60)
Glucose, Bld: 103 mg/dL — ABNORMAL HIGH (ref 65–99)
Potassium: 3.8 mmol/L (ref 3.5–5.1)
Sodium: 136 mmol/L (ref 135–145)
Total Bilirubin: 1.4 mg/dL — ABNORMAL HIGH (ref 0.3–1.2)
Total Protein: 6.3 g/dL — ABNORMAL LOW (ref 6.5–8.1)

## 2016-11-26 LAB — GLUCOSE, CAPILLARY
Glucose-Capillary: 77 mg/dL (ref 65–99)
Glucose-Capillary: 171 mg/dL — ABNORMAL HIGH (ref 65–99)

## 2016-11-26 LAB — PROTIME-INR
INR: 1.58
Prothrombin Time: 19 seconds — ABNORMAL HIGH (ref 11.4–15.2)

## 2016-11-26 MED ORDER — INSULIN LISPRO 100 UNIT/ML ~~LOC~~ SOLN: 10.0000 [IU] | Freq: Three times a day (TID) | SUBCUTANEOUS | 11 refills | Status: DC

## 2016-11-26 MED ORDER — HYDROXYZINE HCL 10 MG PO TABS: 10.0000 mg | ORAL_TABLET | Freq: Three times a day (TID) | ORAL | 0 refills | Status: DC | PRN

## 2016-11-26 MED ORDER — WARFARIN SODIUM 1 MG PO TABS
7.0000 mg | ORAL_TABLET | Freq: Once | ORAL | Status: DC
  Filled 2016-11-26: qty 1

## 2016-11-26 MED ORDER — FUROSEMIDE 10 MG/ML IJ SOLN
80.0000 mg | Freq: Once | INTRAMUSCULAR | Status: AC
  Administered 2016-11-26: 80 mg via INTRAVENOUS
  Filled 2016-11-26: qty 8

## 2016-11-26 MED ORDER — AMOXICILLIN-POT CLAVULANATE 875-125 MG PO TABS: 1.0000 | ORAL_TABLET | Freq: Two times a day (BID) | ORAL | 0 refills | Status: DC

## 2016-11-26 MED ORDER — ENOXAPARIN SODIUM 150 MG/ML ~~LOC~~ SOLN: 125.0000 mg | SUBCUTANEOUS | 0 refills | Status: DC

## 2016-11-26 MED ORDER — DOXYCYCLINE HYCLATE 50 MG PO CAPS: 100.0000 mg | ORAL_CAPSULE | Freq: Two times a day (BID) | ORAL | 0 refills | Status: AC

## 2016-11-26 MED ORDER — AMOXICILLIN-POT CLAVULANATE 250-62.5 MG/5ML PO SUSR: 500.0000 mg | Freq: Two times a day (BID) | ORAL | 0 refills | Status: AC

## 2016-11-26 NOTE — Progress Notes (Signed)
ANTICOAGULATION CONSULT NOTE - Follow Up Consult  Pharmacy Consult for Coumadin Indication: atrial fibrillation  Allergies  Allergen Reactions  . Dilaudid [Hydromorphone Hcl] Nausea And Vomiting and Other (See Comments)    Felt hot  . No Known Allergies     Patient Measurements: Weight: 294 lb 8 oz (133.6 kg)   Vital Signs: Temp: 97.2 F (36.2 C) (06/29 0837) Temp Source: Oral (06/29 0837) BP: 122/54 (06/29 0837) Pulse Rate: 109 (06/29 0837)  Labs:  Recent Labs  11/24/16 0523 11/25/16 0616 11/26/16 0748  HGB 10.1*  --  10.5*  HCT 32.7*  --  33.6*  PLT 234  --  275  LABPROT 18.1* 19.5* 19.0*  INR 1.49 1.63 1.58  CREATININE 2.38* 2.35* 2.37*    Estimated Creatinine Clearance: 32.7 mL/min (A) (by C-G formula based on SCr of 2.37 mg/dL (H)).   Medications:  Scheduled:  . atorvastatin  20 mg Oral Daily  . carvedilol  25 mg Oral BID WC  . cholecalciferol  2,000 Units Oral Daily  . collagenase   Topical Daily  . digoxin  0.125 mg Oral QODAY  . enoxaparin (LOVENOX) injection  125 mg Subcutaneous Q24H  . insulin aspart  0-5 Units Subcutaneous QHS  . insulin aspart  0-9 Units Subcutaneous TID WC  . insulin aspart  3 Units Subcutaneous TID WC  . insulin glargine  70 Units Subcutaneous QHS  . potassium chloride  10 mEq Oral Daily  . senna-docusate  2-3 tablet Oral QHS  . sodium chloride flush  3 mL Intravenous Q12H  . Warfarin - Pharmacist Dosing Inpatient   Does not apply q1800    Assessment: 36 YOM with history of Afib to continue on Coumadin and Lovenox bridge.  INR sub-therapeutic and starting to trend back up.  Patient's SCr is fluctuating with normalized CrCL < 30 ml/min.  No bleeding reported.  Home Coumadin dose: 3.5mg  alternating with 4mg   Plan is to d/c home today on Lovenox bridge.  Discussed with DrAbrol who wants to resume previous home dose of Coumadin, continuing Lovenox bridge with planned f/u for INR on Monday.  Augmentin ordered for discharge,  recommended adjustment of dose for renal function  Goal of Therapy:  INR 2-3 Monitor platelets by anticoagulation protocol: Yes   Plan:  Repeat Coumadin 7mg  po tonight, if still here Continue Lovenox 125mg  SQ q24 Recommend changing Augmentin to 500mg  po q12  Lewie Chamber., PharmD [facility]

## 2016-11-26 NOTE — Procedures (Signed)
Placed patient on CPAP for the night.  Patient is tolerating well at this time. 

## 2016-11-26 NOTE — Discharge Summary (Addendum)
Physician Discharge Summary  Dennis Williams MRN: [id number] DOB/AGE: 1934/04/02 83 y.o.  PCP: Myrlene Broker, MD   Admit date: 11/21/2016 Discharge date: 11/26/2016  Discharge Diagnoses:    Principal Problem:   Acute respiratory failure with hypoxia [facility]) Active Problems:   Atrial fibrillation (HCC)   Hypertension   CHF (congestive heart failure) (HCC)   S/P total knee replacement   Diabetes ([place])   Cellulitis   Sleep apnea   Constipation    Follow-up recommendations Follow-up with PCP in 3-5 days , including all  additional recommended appointments as below Follow-up CBC, CMP in 3-5 days Patient to follow-up with Dr. Ronnie Derby in the next 7-10 days Home health to kindly monitor INR every other day, DC Lovenox when INR is greater than 2.0, results of INR to PCP     Current Discharge Medication List    START taking these medications   Details  amoxicillin-clavulanate (AUGMENTIN) 250-62.5 MG/5ML suspension Take 10 mLs (500 mg total) by mouth 2 (two) times daily. Qty: 100 mL, Refills: 0    doxycycline (VIBRAMYCIN) 50 MG capsule Take 2 capsules (100 mg total) by mouth 2 (two) times daily. Qty: 40 capsule, Refills: 0    enoxaparin (LOVENOX) 150 MG/ML injection Inject 0.83 mLs (125 mg total) into the skin daily. Qty: 5 Syringe, Refills: 0    hydrOXYzine (ATARAX/VISTARIL) 10 MG tablet Take 1 tablet (10 mg total) by mouth 3 (three) times daily as needed for itching. Qty: 30 tablet, Refills: 0      CONTINUE these medications which have CHANGED   Details  insulin lispro (HUMALOG) 100 UNIT/ML injection Inject 0.1 mLs (10 Units total) into the skin 3 (three) times daily before meals. Qty: 10 mL, Refills: 11      CONTINUE these medications which have NOT CHANGED   Details  acetaminophen (TYLENOL) 500 MG tablet Take 1,000 mg by mouth every 6 (six) hours as needed for headache (pain).    atorvastatin (LIPITOR) 20 MG tablet Take 20 mg by mouth daily.      carvedilol  (COREG) 25 MG tablet Take 25 mg by mouth 2 (two) times daily with a meal.      cholecalciferol (VITAMIN D) 1000 units tablet Take 2,000 Units by mouth daily.    digoxin (LANOXIN) 0.25 MG tablet Take 0.125 mg by mouth every other day. take 1/2 tablet every other day    furosemide (LASIX) 40 MG tablet Take 60 mg by mouth daily.    glucosamine-chondroitin 500-400 MG tablet Take 2 tablets by mouth daily.     insulin glargine (LANTUS) 100 UNIT/ML injection Inject 70 Units into the skin at bedtime.     Linaclotide (LINZESS PO) Take 2 tablets by mouth daily.    magnesium citrate SOLN Take 1 Bottle by mouth once.    methocarbamol (ROBAXIN) 500 MG tablet Take 1-2 tablets (500-1,000 mg total) by mouth every 6 (six) hours as needed for muscle spasms. Qty: 60 tablet, Refills: 0    oxyCODONE 10 MG TABS Take 1 tablet (10 mg total) by mouth every 4 (four) hours as needed for breakthrough pain. Qty: 40 tablet, Refills: 0    OXYGEN Inhale 2 L into the lungs as needed (shortness of breath).    potassium chloride (K-DUR) 10 MEQ tablet Take 10 mEq by mouth daily.    PRESCRIPTION MEDICATION Inhale into the lungs at bedtime. CPAP    Red Yeast Rice Extract (RED YEAST RICE PO) Take 2 tablets by mouth daily.  senna-docusate (SENNA S) 8.6-50 MG tablet Take 2-3 tablets by mouth at bedtime.    silver sulfADIAZINE (SILVADENE) 1 % cream Apply 1 application topically daily.    !! warfarin (COUMADIN) 1 MG tablet Take 0.5 mg by mouth See admin instructions. Take 1/2 tablet (0.5 mg) by mouth every other day with 1/2 6 mg tablet (3 mg) for a 3.5 mg dose    !! warfarin (COUMADIN) 4 MG tablet Take 4 mg by mouth every other day.    !! warfarin (COUMADIN) 6 MG tablet Take 3 mg by mouth See admin instructions. Take 1/2 tablet (3 mg) by mouth every other day with 1/2 1 mg tablet (0.5 mg) for a 3.5 mg dose     !! - Potential duplicate medications found. Please discuss with provider.    STOP taking these  medications     enoxaparin (LOVENOX) 30 MG/0.3ML injection          Discharge Condition:  Stable   Discharge Instructions Get Medicines reviewed and adjusted: Please take all your medications with you for your next visit with your Primary MD  Please request your Primary MD to go over all hospital tests and procedure/radiological results at the follow up, please ask your Primary MD to get all Hospital records sent to his/her office.  If you experience worsening of your admission symptoms, develop shortness of breath, life threatening emergency, suicidal or homicidal thoughts you must seek medical attention immediately by calling 911 or calling your MD immediately if symptoms less severe.  You must read complete instructions/literature along with all the possible adverse reactions/side effects for all the Medicines you take and that have been prescribed to you. Take any new Medicines after you have completely understood and accpet all the possible adverse reactions/side effects.   Do not drive when taking Pain medications.   Do not take more than prescribed Pain, Sleep and Anxiety Medications  Special Instructions: If you have smoked or chewed Tobacco in the last 2 yrs please stop smoking, stop any regular Alcohol and or any Recreational drug use.  Wear Seat belts while driving.  Please note  You were cared for by a hospitalist during your hospital stay. Once you are discharged, your primary care physician will handle any further medical issues. Please note that NO REFILLS for any discharge medications will be authorized once you are discharged, as it is imperative that you return to your primary care physician (or establish a relationship with a primary care physician if you do not have one) for your aftercare needs so that they can reassess your need for medications and monitor your lab values.     Allergies  Allergen Reactions  . Dilaudid [Hydromorphone Hcl] Nausea And  Vomiting and Other (See Comments)    Felt hot  . No Known Allergies       Disposition: Home with home health   Consults:  Orthopedics*    Significant Diagnostic Studies:  Mr Tibia Fibula Left Wo Contrast  Result Date: 11/26/2016 CLINICAL DATA:  S/P left knee replacement 7 days ago. Re-admitted with edema and pain to left lower leg. Has scattered serum filled blisters to anterior and medial lower left leg. EXAM: MRI OF LOWER LEFT EXTREMITY WITHOUT CONTRAST; MR OF THE LEFT FEMUR WITHOUT CONTRAST TECHNIQUE: Multiplanar, multisequence MR imaging of the left thigh was performed. No intravenous contrast was administered. Multiplanar, multisequence MR imaging of the left lower leg was performed. No intravenous contrast was administered. COMPARISON:  None. FINDINGS: Bones/Joint/Cartilage Left total  knee arthroplasty with susceptibility artifact partially obscuring the adjacent soft tissue and osseous structures. Right total knee arthroplasty with susceptibility artifact partially obscuring the adjacent soft tissue and osseous structures. No focal marrow signal abnormality. No acute fracture or dislocation. Normal alignment. Small left knee joint effusion. Small right Baker cyst. T2 hyperintense distal left femoral diaphysis bone lesion likely reflecting a benign chondroid lesion such as an enchondroma. Small amount fluid in the pubic symphysis. Ligaments Collateral ligaments are intact. Muscles and Tendons Mild diffuse generalized muscular atrophy. Mild muscle edema in the distal aspect of the left vastus medialis, vastus lateralis and vastus intermedius muscles likely reflecting postsurgical changes. No other muscle signal abnormality. No intramuscular fluid collection or hematoma. 2.2 x 3.2 cm T1 hyperintense mass in the superior aspect of the right gross Los muscle with complete signal dropout on fat saturated images most consistent with a simple lipoma. Soft tissue No fluid collection or hematoma. No  soft tissue mass. Generalized edema in the subcutaneous fat throughout the left thigh and left lower leg. Mild soft tissue edema in the subcutaneous fat of the medial aspect of the right lower leg. Skin blister seen on physical exam in the medial left lower leg are not well delineated on the MRI. IMPRESSION: 1. Generalized soft tissue edema and throughout the left thigh and lower leg of uncertain etiology. These may reflect postsurgical changes given recent arthroplasty versus lymph edema or cellulitis. 2. No drainable fluid collection or hematoma. 3. Skin blister seen on physical exam in the medial left lower leg are not well delineated on the MRI. Electronically Signed   By: Kathreen Devoid   On: 11/26/2016 08:10   Mr Femur Left Wo Contrast  Result Date: 11/26/2016 CLINICAL DATA:  S/P left knee replacement 7 days ago. Re-admitted with edema and pain to left lower leg. Has scattered serum filled blisters to anterior and medial lower left leg. EXAM: MRI OF LOWER LEFT EXTREMITY WITHOUT CONTRAST; MR OF THE LEFT FEMUR WITHOUT CONTRAST TECHNIQUE: Multiplanar, multisequence MR imaging of the left thigh was performed. No intravenous contrast was administered. Multiplanar, multisequence MR imaging of the left lower leg was performed. No intravenous contrast was administered. COMPARISON:  None. FINDINGS: Bones/Joint/Cartilage Left total knee arthroplasty with susceptibility artifact partially obscuring the adjacent soft tissue and osseous structures. Right total knee arthroplasty with susceptibility artifact partially obscuring the adjacent soft tissue and osseous structures. No focal marrow signal abnormality. No acute fracture or dislocation. Normal alignment. Small left knee joint effusion. Small right Baker cyst. T2 hyperintense distal left femoral diaphysis bone lesion likely reflecting a benign chondroid lesion such as an enchondroma. Small amount fluid in the pubic symphysis. Ligaments Collateral ligaments are  intact. Muscles and Tendons Mild diffuse generalized muscular atrophy. Mild muscle edema in the distal aspect of the left vastus medialis, vastus lateralis and vastus intermedius muscles likely reflecting postsurgical changes. No other muscle signal abnormality. No intramuscular fluid collection or hematoma. 2.2 x 3.2 cm T1 hyperintense mass in the superior aspect of the right gross Los muscle with complete signal dropout on fat saturated images most consistent with a simple lipoma. Soft tissue No fluid collection or hematoma. No soft tissue mass. Generalized edema in the subcutaneous fat throughout the left thigh and left lower leg. Mild soft tissue edema in the subcutaneous fat of the medial aspect of the right lower leg. Skin blister seen on physical exam in the medial left lower leg are not well delineated on the MRI. IMPRESSION: 1. Generalized soft  tissue edema and throughout the left thigh and lower leg of uncertain etiology. These may reflect postsurgical changes given recent arthroplasty versus lymph edema or cellulitis. 2. No drainable fluid collection or hematoma. 3. Skin blister seen on physical exam in the medial left lower leg are not well delineated on the MRI. Electronically Signed   By: Kathreen Devoid   On: 11/26/2016 08:10   Dg Chest Port 1 View  Result Date: 11/21/2016 CLINICAL DATA:  Patient complains of pain and blisters to left lower leg since knee replacement last Monday. Also BS running in 300's this am; pt denies SOB or CP. Hx of DM, HTN, and CHF; former smoker EXAM: PORTABLE CHEST 1 VIEW COMPARISON:  03/13/2016 FINDINGS: Cardiac silhouette is normal in size. No mediastinal or hilar masses. No convincing adenopathy. Clear lungs.  No pleural effusion or pneumothorax. Skeletal structures are demineralized but intact. IMPRESSION: No active disease. Electronically Signed   By: Lajean Manes M.D.   On: 11/21/2016 16:53    echocardiogram  LV EF: 60% -    65%  ------------------------------------------------------------------- Indications:      Dyspnea 786.09.  ------------------------------------------------------------------- History:   PMH:   Atrial fibrillation.  Congestive heart failure. Risk factors:  Hypertension. Diabetes mellitus. Dyslipidemia.  ------------------------------------------------------------------- Study Conclusions  - Left ventricle: The cavity size was normal. Wall thickness was   increased in a pattern of mild LVH. Indeterminant diastolic   function (atrial fibrillation). Systolic function was normal. The   estimated ejection fraction was in the range of 60% to 65%.   Although no diagnostic regional wall motion abnormality was   identified, this possibility cannot be completely excluded on the   basis of this study. - Aortic valve: There was no stenosis. There was trivial   regurgitation. - Aorta: Mildly dilated ascending aorta. Ascending aortic diameter:   38 mm (S). - Mitral valve: There was trivial regurgitation. - Left atrium: The atrium was severely dilated. - Right ventricle: The cavity size was normal. Systolic function   was normal. - Right atrium: The atrium was moderately to severely dilated. - Tricuspid valve: Peak RV-RA gradient (S): 43 mm Hg. - Pulmonary arteries: PA peak pressure: 51 mm Hg (S). - Systemic veins: IVC measured 2.4 cm with > 50% respirophasic   variation, suggesting RA pressure 8 mmHg.  Impressions:  - The patient was in atrial fibrillation. Normal LV size with mild   LV hypertrophy. EF 60-65%. Normal RV size and systolic function.   Biatrial enlargement. Moderate pulmonary hypertension.      Filed Weights   11/24/16 0440  Weight: 133.6 kg (294 lb 8 oz)     Microbiology: Recent Results (from the past 240 hour(s))  Culture, blood (routine x 2)     Status: None (Preliminary result)   Collection Time: 11/21/16  5:21 PM  Result Value Ref Range Status    Specimen Description BLOOD LEFT ANTECUBITAL  Final   Special Requests IN PEDIATRIC BOTTLE Blood Culture adequate volume  Final   Culture NO GROWTH 4 DAYS  Final   Report Status PENDING  Incomplete  Culture, blood (routine x 2)     Status: None (Preliminary result)   Collection Time: 11/21/16  5:21 PM  Result Value Ref Range Status   Specimen Description BLOOD RIGHT ANTECUBITAL  Final   Special Requests IN PEDIATRIC BOTTLE Blood Culture adequate volume  Final   Culture NO GROWTH 4 DAYS  Final   Report Status PENDING  Incomplete  Blood Culture    Component Value Date/Time   SDES BLOOD LEFT ANTECUBITAL 11/21/2016 1721   SDES BLOOD RIGHT ANTECUBITAL 11/21/2016 1721   SPECREQUEST IN PEDIATRIC BOTTLE Blood Culture adequate volume 11/21/2016 1721   SPECREQUEST IN PEDIATRIC BOTTLE Blood Culture adequate volume 11/21/2016 1721   CULT NO GROWTH 4 DAYS 11/21/2016 1721   CULT NO GROWTH 4 DAYS 11/21/2016 1721   REPTSTATUS PENDING 11/21/2016 1721   REPTSTATUS PENDING 11/21/2016 1721      Labs: Results for orders placed or performed during the hospital encounter of 11/21/16 (from the past 48 hour(s))  Glucose, capillary     Status: Abnormal   Collection Time: 11/24/16 11:37 AM  Result Value Ref Range   Glucose-Capillary 228 (H) 65 - 99 mg/dL  Glucose, capillary     Status: Abnormal   Collection Time: 11/24/16  4:58 PM  Result Value Ref Range   Glucose-Capillary 142 (H) 65 - 99 mg/dL  Glucose, capillary     Status: Abnormal   Collection Time: 11/24/16  8:42 PM  Result Value Ref Range   Glucose-Capillary 180 (H) 65 - 99 mg/dL  Protime-INR     Status: Abnormal   Collection Time: 11/25/16  6:16 AM  Result Value Ref Range   Prothrombin Time 19.5 (H) 11.4 - 15.2 seconds   INR 1.63   Comprehensive metabolic panel     Status: Abnormal   Collection Time: 11/25/16  6:16 AM  Result Value Ref Range   Sodium 135 135 - 145 mmol/L   Potassium 3.6 3.5 - 5.1 mmol/L   Chloride 96 (L) 101 -  111 mmol/L   CO2 31 22 - 32 mmol/L   Glucose, Bld 71 65 - 99 mg/dL   BUN 45 (H) 6 - 20 mg/dL   Creatinine, Ser 2.35 (H) 0.61 - 1.24 mg/dL   Calcium 8.3 (L) 8.9 - 10.3 mg/dL   Total Protein 6.0 (L) 6.5 - 8.1 g/dL   Albumin 2.5 (L) 3.5 - 5.0 g/dL   AST 18 15 - 41 U/L   ALT 10 (L) 17 - 63 U/L   Alkaline Phosphatase 69 38 - 126 U/L   Total Bilirubin 1.3 (H) 0.3 - 1.2 mg/dL   GFR calc non Af Amer 24 (L) >60 mL/min   GFR calc Af Amer 28 (L) >60 mL/min    Comment: (NOTE) The eGFR has been calculated using the CKD EPI equation. This calculation has not been validated in all clinical situations. eGFR's persistently <60 mL/min signify possible Chronic Kidney Disease.    Anion gap 8 5 - 15  Glucose, capillary     Status: Abnormal   Collection Time: 11/25/16  6:56 AM  Result Value Ref Range   Glucose-Capillary 108 (H) 65 - 99 mg/dL  Glucose, capillary     Status: Abnormal   Collection Time: 11/25/16 11:34 AM  Result Value Ref Range   Glucose-Capillary 143 (H) 65 - 99 mg/dL  Glucose, capillary     Status: Abnormal   Collection Time: 11/25/16  5:23 PM  Result Value Ref Range   Glucose-Capillary 164 (H) 65 - 99 mg/dL  Glucose, capillary     Status: Abnormal   Collection Time: 11/25/16  9:24 PM  Result Value Ref Range   Glucose-Capillary 117 (H) 65 - 99 mg/dL  Glucose, capillary     Status: None   Collection Time: 11/26/16  7:03 AM  Result Value Ref Range   Glucose-Capillary 77 65 - 99 mg/dL  Protime-INR  Status: Abnormal   Collection Time: 11/26/16  7:48 AM  Result Value Ref Range   Prothrombin Time 19.0 (H) 11.4 - 15.2 seconds   INR 1.58   CBC     Status: Abnormal   Collection Time: 11/26/16  7:48 AM  Result Value Ref Range   WBC 8.4 4.0 - 10.5 K/uL   RBC 3.51 (L) 4.22 - 5.81 MIL/uL   Hemoglobin 10.5 (L) 13.0 - 17.0 g/dL   HCT 33.6 (L) 39.0 - 52.0 %   MCV 95.7 78.0 - 100.0 fL   MCH 29.9 26.0 - 34.0 pg   MCHC 31.3 30.0 - 36.0 g/dL   RDW 14.2 11.5 - 15.5 %   Platelets 275  150 - 400 K/uL  Comprehensive metabolic panel     Status: Abnormal   Collection Time: 11/26/16  7:48 AM  Result Value Ref Range   Sodium 136 135 - 145 mmol/L   Potassium 3.8 3.5 - 5.1 mmol/L   Chloride 98 (L) 101 - 111 mmol/L   CO2 29 22 - 32 mmol/L   Glucose, Bld 103 (H) 65 - 99 mg/dL   BUN 41 (H) 6 - 20 mg/dL   Creatinine, Ser 2.37 (H) 0.61 - 1.24 mg/dL   Calcium 8.4 (L) 8.9 - 10.3 mg/dL   Total Protein 6.3 (L) 6.5 - 8.1 g/dL   Albumin 2.7 (L) 3.5 - 5.0 g/dL   AST 22 15 - 41 U/L   ALT 12 (L) 17 - 63 U/L   Alkaline Phosphatase 79 38 - 126 U/L   Total Bilirubin 1.4 (H) 0.3 - 1.2 mg/dL   GFR calc non Af Amer 24 (L) >60 mL/min   GFR calc Af Amer 28 (L) >60 mL/min    Comment: (NOTE) The eGFR has been calculated using the CKD EPI equation. This calculation has not been validated in all clinical situations. eGFR's persistently <60 mL/min signify possible Chronic Kidney Disease.    Anion gap 9 5 - 15     Lipid Panel  No results found for: CHOL, TRIG, HDL, CHOLHDL, VLDL, LDLCALC, LDLDIRECT   Lab Results  Component Value Date   HGBA1C 8.1 (H) 11/22/2016   HGBA1C 7.8 (H) 11/15/2016       HPI    83 year old male with medical history significant for diabetes, chronic kidney disease, hypertension, A. fib on Coumadin, 08 of left knee, status post left knee replacement 6 days prior to admission. Presented to the emergency department complaining of left leg pain and swelling on evaluation revealed left lower leg cellulitis with open blister and purulent drainage. Upon ED evaluation patient was found to be hypoxemic 89% on room air. Patient admitted for IV antibiotics and further evaluation. Doppler of the leg negative for DVT  HOSPITAL COURSE:   Cellulitis of left lower leg - status post left knee replacement 6 days prior to admission Significant edema of the legs likely contributing to the cellulitis, also has some purulent drainage in the anterior aspect of the leg from an open  blister.  Patient started on broad-spectrum antibiotics, vancomycin and Zosyn since admission Evaluation by orthopedics, they recommended antibiotics and diuresis MRI negative for any drainable abscess/hematoma Patient switched over to Augmentin and doxycycline for 10 days Blood culture no growth so far MRSA PCR negative Venous Doppler negative for DVT Wound care recommended cleaning of blisters with soap and water and apply silicon border foam dressing   CK D stage III, baseline creatinine around 1.7-2 Patient diuresed with IV Lasix Creatinine  slightly up from baseline but stable 3 days in the setting of deep diuresis with IV Lasix  Status post left knee replacement 11/14/16 Pain management as needed Continue physical therapy PT recommended home health  [facility]-followed by Dr. Baird Lyons O2 saturation 89% on room air on admission, likely to be baseline. Chest x-ray negative Continue CPAP at night    Acute on Chronic systolic heart failure,  Slightly decompensated , diuresed with multiple doses of IV Lasix ECHO showed EF of 60-65%, moderate pulmonary hypertension likely causing dependent edema Continue Coreg and Lasix PO patient is followed by cardiologist Dr. Jenne Campus in [place]   A. fib currently heart rate well controlled Continue current regimen Patient on Lovenox bridge, change to therapeutic dose and continue monitoring INR INR subtherapeutic today, DC Lovenox when INR greater than 2.0  Hypertension BP stable Continue current regimen   DVT prophylaxis: Lovenox and Coumadin   Discharge Exam:  Blood pressure (!) 122/54, pulse (!) 109, temperature 97.2 F (36.2 C), temperature source Oral, resp. rate 18, weight 133.6 kg (294 lb 8 oz), SpO2 96 %.  Cardiovascular system: S1 & S2 heard, RRR. No JVD, murmurs, rubs or gallops Gastrointestinal system: Abdomen is nondistended, soft and nontender. No organomegaly or masses felt. Normal bowel sounds  heard. Central nervous system: Alert and oriented. No focal neurological deficits. Extremities: Persistent left lower extremity erythema, 2+ pitting edema  1 open blister with purulent drainage   Skin: LLE erythematous, swollen and tense. Some scattered blisters,in left LLE  drainage  Psychiatry: Judgement and insight appear normal. Mood & affect appropriate.     Follow-up Information    [facility].   Specialty:  [place] Why:  A representative from [facility] will contact you to arrange resumption of your therapy. Contact information: [address] [place] [address] [phone]           Signed: Reyne Dumas 11/26/2016, 9:18 AM        Time spent >1 hour

## 2016-11-26 NOTE — Progress Notes (Signed)
Removed IV, provided discharge education, questions and concerns addressed, patient not in distress, discharged accompanied by wife.

## 2016-11-28 DIAGNOSIS — I509 Heart failure, unspecified: Secondary | ICD-10-CM | POA: Diagnosis not present

## 2016-11-28 DIAGNOSIS — I4891 Unspecified atrial fibrillation: Secondary | ICD-10-CM | POA: Diagnosis not present

## 2016-11-28 DIAGNOSIS — Z7901 Long term (current) use of anticoagulants: Secondary | ICD-10-CM | POA: Diagnosis not present

## 2016-11-28 DIAGNOSIS — Z87891 Personal history of nicotine dependence: Secondary | ICD-10-CM | POA: Diagnosis not present

## 2016-11-28 DIAGNOSIS — E1151 Type 2 diabetes mellitus with diabetic peripheral angiopathy without gangrene: Secondary | ICD-10-CM | POA: Diagnosis not present

## 2016-11-28 DIAGNOSIS — Z471 Aftercare following joint replacement surgery: Secondary | ICD-10-CM | POA: Diagnosis not present

## 2016-11-28 DIAGNOSIS — I110 Hypertensive heart disease with heart failure: Secondary | ICD-10-CM | POA: Diagnosis not present

## 2016-11-28 DIAGNOSIS — Z5181 Encounter for therapeutic drug level monitoring: Secondary | ICD-10-CM | POA: Diagnosis not present

## 2016-11-28 DIAGNOSIS — I429 Cardiomyopathy, unspecified: Secondary | ICD-10-CM | POA: Diagnosis not present

## 2016-11-28 DIAGNOSIS — Z794 Long term (current) use of insulin: Secondary | ICD-10-CM | POA: Diagnosis not present

## 2016-11-28 DIAGNOSIS — K449 Diaphragmatic hernia without obstruction or gangrene: Secondary | ICD-10-CM | POA: Diagnosis not present

## 2016-11-28 DIAGNOSIS — Z96652 Presence of left artificial knee joint: Secondary | ICD-10-CM | POA: Diagnosis not present

## 2016-11-28 DIAGNOSIS — L03116 Cellulitis of left lower limb: Secondary | ICD-10-CM | POA: Diagnosis not present

## 2016-11-29 DIAGNOSIS — I482 Chronic atrial fibrillation: Secondary | ICD-10-CM | POA: Diagnosis not present

## 2016-11-29 DIAGNOSIS — L03116 Cellulitis of left lower limb: Secondary | ICD-10-CM | POA: Diagnosis not present

## 2016-11-29 DIAGNOSIS — M25562 Pain in left knee: Secondary | ICD-10-CM | POA: Diagnosis not present

## 2016-11-30 DIAGNOSIS — I429 Cardiomyopathy, unspecified: Secondary | ICD-10-CM | POA: Diagnosis not present

## 2016-11-30 DIAGNOSIS — Z5181 Encounter for therapeutic drug level monitoring: Secondary | ICD-10-CM | POA: Diagnosis not present

## 2016-11-30 DIAGNOSIS — Z7901 Long term (current) use of anticoagulants: Secondary | ICD-10-CM | POA: Diagnosis not present

## 2016-11-30 DIAGNOSIS — Z471 Aftercare following joint replacement surgery: Secondary | ICD-10-CM | POA: Diagnosis not present

## 2016-11-30 DIAGNOSIS — I4891 Unspecified atrial fibrillation: Secondary | ICD-10-CM | POA: Diagnosis not present

## 2016-11-30 DIAGNOSIS — L03116 Cellulitis of left lower limb: Secondary | ICD-10-CM | POA: Diagnosis not present

## 2016-11-30 DIAGNOSIS — Z87891 Personal history of nicotine dependence: Secondary | ICD-10-CM | POA: Diagnosis not present

## 2016-11-30 DIAGNOSIS — K449 Diaphragmatic hernia without obstruction or gangrene: Secondary | ICD-10-CM | POA: Diagnosis not present

## 2016-11-30 DIAGNOSIS — E1151 Type 2 diabetes mellitus with diabetic peripheral angiopathy without gangrene: Secondary | ICD-10-CM | POA: Diagnosis not present

## 2016-11-30 DIAGNOSIS — Z794 Long term (current) use of insulin: Secondary | ICD-10-CM | POA: Diagnosis not present

## 2016-11-30 DIAGNOSIS — Z96652 Presence of left artificial knee joint: Secondary | ICD-10-CM | POA: Diagnosis not present

## 2016-11-30 DIAGNOSIS — I509 Heart failure, unspecified: Secondary | ICD-10-CM | POA: Diagnosis not present

## 2016-11-30 DIAGNOSIS — I110 Hypertensive heart disease with heart failure: Secondary | ICD-10-CM | POA: Diagnosis not present

## 2016-12-01 DIAGNOSIS — I509 Heart failure, unspecified: Secondary | ICD-10-CM | POA: Diagnosis not present

## 2016-12-01 DIAGNOSIS — Z87891 Personal history of nicotine dependence: Secondary | ICD-10-CM | POA: Diagnosis not present

## 2016-12-01 DIAGNOSIS — L03116 Cellulitis of left lower limb: Secondary | ICD-10-CM | POA: Diagnosis not present

## 2016-12-01 DIAGNOSIS — Z471 Aftercare following joint replacement surgery: Secondary | ICD-10-CM | POA: Diagnosis not present

## 2016-12-01 DIAGNOSIS — Z794 Long term (current) use of insulin: Secondary | ICD-10-CM | POA: Diagnosis not present

## 2016-12-01 DIAGNOSIS — K449 Diaphragmatic hernia without obstruction or gangrene: Secondary | ICD-10-CM | POA: Diagnosis not present

## 2016-12-01 DIAGNOSIS — I4891 Unspecified atrial fibrillation: Secondary | ICD-10-CM | POA: Diagnosis not present

## 2016-12-01 DIAGNOSIS — Z96652 Presence of left artificial knee joint: Secondary | ICD-10-CM | POA: Diagnosis not present

## 2016-12-01 DIAGNOSIS — E1151 Type 2 diabetes mellitus with diabetic peripheral angiopathy without gangrene: Secondary | ICD-10-CM | POA: Diagnosis not present

## 2016-12-01 DIAGNOSIS — I110 Hypertensive heart disease with heart failure: Secondary | ICD-10-CM | POA: Diagnosis not present

## 2016-12-01 DIAGNOSIS — Z7901 Long term (current) use of anticoagulants: Secondary | ICD-10-CM | POA: Diagnosis not present

## 2016-12-01 DIAGNOSIS — I429 Cardiomyopathy, unspecified: Secondary | ICD-10-CM | POA: Diagnosis not present

## 2016-12-01 DIAGNOSIS — Z5181 Encounter for therapeutic drug level monitoring: Secondary | ICD-10-CM | POA: Diagnosis not present

## 2016-12-02 DIAGNOSIS — R600 Localized edema: Secondary | ICD-10-CM | POA: Diagnosis not present

## 2016-12-02 DIAGNOSIS — Z471 Aftercare following joint replacement surgery: Secondary | ICD-10-CM | POA: Diagnosis not present

## 2016-12-02 DIAGNOSIS — Z96652 Presence of left artificial knee joint: Secondary | ICD-10-CM | POA: Diagnosis not present

## 2016-12-02 DIAGNOSIS — M25462 Effusion, left knee: Secondary | ICD-10-CM | POA: Diagnosis not present

## 2016-12-03 DIAGNOSIS — I509 Heart failure, unspecified: Secondary | ICD-10-CM | POA: Diagnosis not present

## 2016-12-03 DIAGNOSIS — E1151 Type 2 diabetes mellitus with diabetic peripheral angiopathy without gangrene: Secondary | ICD-10-CM | POA: Diagnosis not present

## 2016-12-03 DIAGNOSIS — Z7901 Long term (current) use of anticoagulants: Secondary | ICD-10-CM | POA: Diagnosis not present

## 2016-12-03 DIAGNOSIS — K449 Diaphragmatic hernia without obstruction or gangrene: Secondary | ICD-10-CM | POA: Diagnosis not present

## 2016-12-03 DIAGNOSIS — Z5181 Encounter for therapeutic drug level monitoring: Secondary | ICD-10-CM | POA: Diagnosis not present

## 2016-12-03 DIAGNOSIS — Z96652 Presence of left artificial knee joint: Secondary | ICD-10-CM | POA: Diagnosis not present

## 2016-12-03 DIAGNOSIS — Z794 Long term (current) use of insulin: Secondary | ICD-10-CM | POA: Diagnosis not present

## 2016-12-03 DIAGNOSIS — Z471 Aftercare following joint replacement surgery: Secondary | ICD-10-CM | POA: Diagnosis not present

## 2016-12-03 DIAGNOSIS — I429 Cardiomyopathy, unspecified: Secondary | ICD-10-CM | POA: Diagnosis not present

## 2016-12-03 DIAGNOSIS — I4891 Unspecified atrial fibrillation: Secondary | ICD-10-CM | POA: Diagnosis not present

## 2016-12-03 DIAGNOSIS — L03116 Cellulitis of left lower limb: Secondary | ICD-10-CM | POA: Diagnosis not present

## 2016-12-03 DIAGNOSIS — I110 Hypertensive heart disease with heart failure: Secondary | ICD-10-CM | POA: Diagnosis not present

## 2016-12-03 DIAGNOSIS — Z87891 Personal history of nicotine dependence: Secondary | ICD-10-CM | POA: Diagnosis not present

## 2016-12-06 DIAGNOSIS — Z96652 Presence of left artificial knee joint: Secondary | ICD-10-CM | POA: Diagnosis not present

## 2016-12-06 DIAGNOSIS — T457X5S Adverse effect of anticoagulant antagonists, vitamin K and other coagulants, sequela: Secondary | ICD-10-CM | POA: Diagnosis not present

## 2016-12-06 DIAGNOSIS — M25562 Pain in left knee: Secondary | ICD-10-CM | POA: Diagnosis not present

## 2016-12-06 DIAGNOSIS — M62552 Muscle wasting and atrophy, not elsewhere classified, left thigh: Secondary | ICD-10-CM | POA: Diagnosis not present

## 2016-12-06 DIAGNOSIS — R2689 Other abnormalities of gait and mobility: Secondary | ICD-10-CM | POA: Diagnosis not present

## 2016-12-06 DIAGNOSIS — I482 Chronic atrial fibrillation: Secondary | ICD-10-CM | POA: Diagnosis not present

## 2016-12-08 DIAGNOSIS — M62552 Muscle wasting and atrophy, not elsewhere classified, left thigh: Secondary | ICD-10-CM | POA: Diagnosis not present

## 2016-12-08 DIAGNOSIS — M25562 Pain in left knee: Secondary | ICD-10-CM | POA: Diagnosis not present

## 2016-12-08 DIAGNOSIS — Z96652 Presence of left artificial knee joint: Secondary | ICD-10-CM | POA: Diagnosis not present

## 2016-12-08 DIAGNOSIS — R2689 Other abnormalities of gait and mobility: Secondary | ICD-10-CM | POA: Diagnosis not present

## 2016-12-10 DIAGNOSIS — M25562 Pain in left knee: Secondary | ICD-10-CM | POA: Diagnosis not present

## 2016-12-10 DIAGNOSIS — Z96652 Presence of left artificial knee joint: Secondary | ICD-10-CM | POA: Diagnosis not present

## 2016-12-10 DIAGNOSIS — R2689 Other abnormalities of gait and mobility: Secondary | ICD-10-CM | POA: Diagnosis not present

## 2016-12-10 DIAGNOSIS — M62552 Muscle wasting and atrophy, not elsewhere classified, left thigh: Secondary | ICD-10-CM | POA: Diagnosis not present

## 2016-12-13 DIAGNOSIS — R2689 Other abnormalities of gait and mobility: Secondary | ICD-10-CM | POA: Diagnosis not present

## 2016-12-13 DIAGNOSIS — M25562 Pain in left knee: Secondary | ICD-10-CM | POA: Diagnosis not present

## 2016-12-13 DIAGNOSIS — M62552 Muscle wasting and atrophy, not elsewhere classified, left thigh: Secondary | ICD-10-CM | POA: Diagnosis not present

## 2016-12-13 DIAGNOSIS — Z96652 Presence of left artificial knee joint: Secondary | ICD-10-CM | POA: Diagnosis not present

## 2016-12-17 DIAGNOSIS — I509 Heart failure, unspecified: Secondary | ICD-10-CM | POA: Diagnosis not present

## 2016-12-17 DIAGNOSIS — Z96652 Presence of left artificial knee joint: Secondary | ICD-10-CM | POA: Diagnosis not present

## 2016-12-17 DIAGNOSIS — M25562 Pain in left knee: Secondary | ICD-10-CM | POA: Diagnosis not present

## 2016-12-17 DIAGNOSIS — R2689 Other abnormalities of gait and mobility: Secondary | ICD-10-CM | POA: Diagnosis not present

## 2016-12-17 DIAGNOSIS — M62552 Muscle wasting and atrophy, not elsewhere classified, left thigh: Secondary | ICD-10-CM | POA: Diagnosis not present

## 2016-12-19 DIAGNOSIS — G4733 Obstructive sleep apnea (adult) (pediatric): Secondary | ICD-10-CM | POA: Diagnosis not present

## 2016-12-23 DIAGNOSIS — I482 Chronic atrial fibrillation: Secondary | ICD-10-CM | POA: Diagnosis not present

## 2016-12-23 DIAGNOSIS — T457X5S Adverse effect of anticoagulant antagonists, vitamin K and other coagulants, sequela: Secondary | ICD-10-CM | POA: Diagnosis not present

## 2016-12-24 DIAGNOSIS — M25562 Pain in left knee: Secondary | ICD-10-CM | POA: Diagnosis not present

## 2016-12-24 DIAGNOSIS — Z96652 Presence of left artificial knee joint: Secondary | ICD-10-CM | POA: Diagnosis not present

## 2016-12-24 DIAGNOSIS — M62552 Muscle wasting and atrophy, not elsewhere classified, left thigh: Secondary | ICD-10-CM | POA: Diagnosis not present

## 2016-12-24 DIAGNOSIS — R2689 Other abnormalities of gait and mobility: Secondary | ICD-10-CM | POA: Diagnosis not present

## 2016-12-27 DIAGNOSIS — M62552 Muscle wasting and atrophy, not elsewhere classified, left thigh: Secondary | ICD-10-CM | POA: Diagnosis not present

## 2016-12-27 DIAGNOSIS — M25562 Pain in left knee: Secondary | ICD-10-CM | POA: Diagnosis not present

## 2016-12-27 DIAGNOSIS — Z96652 Presence of left artificial knee joint: Secondary | ICD-10-CM | POA: Diagnosis not present

## 2016-12-27 DIAGNOSIS — R2689 Other abnormalities of gait and mobility: Secondary | ICD-10-CM | POA: Diagnosis not present

## 2017-01-03 DIAGNOSIS — R2689 Other abnormalities of gait and mobility: Secondary | ICD-10-CM | POA: Diagnosis not present

## 2017-01-03 DIAGNOSIS — Z96652 Presence of left artificial knee joint: Secondary | ICD-10-CM | POA: Diagnosis not present

## 2017-01-03 DIAGNOSIS — M62552 Muscle wasting and atrophy, not elsewhere classified, left thigh: Secondary | ICD-10-CM | POA: Diagnosis not present

## 2017-01-03 DIAGNOSIS — M25562 Pain in left knee: Secondary | ICD-10-CM | POA: Diagnosis not present

## 2017-01-10 DIAGNOSIS — R2689 Other abnormalities of gait and mobility: Secondary | ICD-10-CM | POA: Diagnosis not present

## 2017-01-10 DIAGNOSIS — Z96652 Presence of left artificial knee joint: Secondary | ICD-10-CM | POA: Diagnosis not present

## 2017-01-10 DIAGNOSIS — M62552 Muscle wasting and atrophy, not elsewhere classified, left thigh: Secondary | ICD-10-CM | POA: Diagnosis not present

## 2017-01-10 DIAGNOSIS — M25562 Pain in left knee: Secondary | ICD-10-CM | POA: Diagnosis not present

## 2017-01-11 DIAGNOSIS — A46 Erysipelas: Secondary | ICD-10-CM | POA: Diagnosis not present

## 2017-01-11 DIAGNOSIS — Z7901 Long term (current) use of anticoagulants: Secondary | ICD-10-CM | POA: Diagnosis not present

## 2017-01-14 DIAGNOSIS — Z96652 Presence of left artificial knee joint: Secondary | ICD-10-CM | POA: Diagnosis not present

## 2017-01-14 DIAGNOSIS — R2689 Other abnormalities of gait and mobility: Secondary | ICD-10-CM | POA: Diagnosis not present

## 2017-01-14 DIAGNOSIS — M62552 Muscle wasting and atrophy, not elsewhere classified, left thigh: Secondary | ICD-10-CM | POA: Diagnosis not present

## 2017-01-14 DIAGNOSIS — M25562 Pain in left knee: Secondary | ICD-10-CM | POA: Diagnosis not present

## 2017-01-17 DIAGNOSIS — M25562 Pain in left knee: Secondary | ICD-10-CM | POA: Diagnosis not present

## 2017-01-17 DIAGNOSIS — I509 Heart failure, unspecified: Secondary | ICD-10-CM | POA: Diagnosis not present

## 2017-01-17 DIAGNOSIS — Z96652 Presence of left artificial knee joint: Secondary | ICD-10-CM | POA: Diagnosis not present

## 2017-01-17 DIAGNOSIS — M62552 Muscle wasting and atrophy, not elsewhere classified, left thigh: Secondary | ICD-10-CM | POA: Diagnosis not present

## 2017-01-17 DIAGNOSIS — R2689 Other abnormalities of gait and mobility: Secondary | ICD-10-CM | POA: Diagnosis not present

## 2017-01-19 DIAGNOSIS — G4733 Obstructive sleep apnea (adult) (pediatric): Secondary | ICD-10-CM | POA: Diagnosis not present

## 2017-01-28 DIAGNOSIS — M545 Low back pain: Secondary | ICD-10-CM | POA: Diagnosis not present

## 2017-01-28 DIAGNOSIS — A46 Erysipelas: Secondary | ICD-10-CM | POA: Diagnosis not present

## 2017-01-28 DIAGNOSIS — S80822D Blister (nonthermal), left lower leg, subsequent encounter: Secondary | ICD-10-CM | POA: Diagnosis not present

## 2017-01-28 DIAGNOSIS — G8929 Other chronic pain: Secondary | ICD-10-CM | POA: Diagnosis not present

## 2017-01-28 DIAGNOSIS — S81802D Unspecified open wound, left lower leg, subsequent encounter: Secondary | ICD-10-CM | POA: Diagnosis not present

## 2017-02-03 DIAGNOSIS — R2689 Other abnormalities of gait and mobility: Secondary | ICD-10-CM | POA: Diagnosis not present

## 2017-02-03 DIAGNOSIS — M62552 Muscle wasting and atrophy, not elsewhere classified, left thigh: Secondary | ICD-10-CM | POA: Diagnosis not present

## 2017-02-03 DIAGNOSIS — M25562 Pain in left knee: Secondary | ICD-10-CM | POA: Diagnosis not present

## 2017-02-03 DIAGNOSIS — Z96652 Presence of left artificial knee joint: Secondary | ICD-10-CM | POA: Diagnosis not present

## 2017-02-07 DIAGNOSIS — R2689 Other abnormalities of gait and mobility: Secondary | ICD-10-CM | POA: Diagnosis not present

## 2017-02-07 DIAGNOSIS — M62552 Muscle wasting and atrophy, not elsewhere classified, left thigh: Secondary | ICD-10-CM | POA: Diagnosis not present

## 2017-02-07 DIAGNOSIS — M25562 Pain in left knee: Secondary | ICD-10-CM | POA: Diagnosis not present

## 2017-02-07 DIAGNOSIS — Z96652 Presence of left artificial knee joint: Secondary | ICD-10-CM | POA: Diagnosis not present

## 2017-02-08 DIAGNOSIS — G4733 Obstructive sleep apnea (adult) (pediatric): Secondary | ICD-10-CM | POA: Diagnosis not present

## 2017-02-08 DIAGNOSIS — Z9989 Dependence on other enabling machines and devices: Secondary | ICD-10-CM | POA: Diagnosis not present

## 2017-02-11 DIAGNOSIS — I4891 Unspecified atrial fibrillation: Secondary | ICD-10-CM | POA: Diagnosis not present

## 2017-02-11 DIAGNOSIS — Z5181 Encounter for therapeutic drug level monitoring: Secondary | ICD-10-CM | POA: Diagnosis not present

## 2017-02-11 DIAGNOSIS — Z7901 Long term (current) use of anticoagulants: Secondary | ICD-10-CM | POA: Diagnosis not present

## 2017-02-16 DIAGNOSIS — Z96652 Presence of left artificial knee joint: Secondary | ICD-10-CM | POA: Diagnosis not present

## 2017-02-16 DIAGNOSIS — M25562 Pain in left knee: Secondary | ICD-10-CM | POA: Diagnosis not present

## 2017-02-16 DIAGNOSIS — R2689 Other abnormalities of gait and mobility: Secondary | ICD-10-CM | POA: Diagnosis not present

## 2017-02-16 DIAGNOSIS — M62552 Muscle wasting and atrophy, not elsewhere classified, left thigh: Secondary | ICD-10-CM | POA: Diagnosis not present

## 2017-02-17 DIAGNOSIS — I509 Heart failure, unspecified: Secondary | ICD-10-CM | POA: Diagnosis not present

## 2017-02-18 DIAGNOSIS — Z5181 Encounter for therapeutic drug level monitoring: Secondary | ICD-10-CM | POA: Diagnosis not present

## 2017-02-18 DIAGNOSIS — I4891 Unspecified atrial fibrillation: Secondary | ICD-10-CM | POA: Diagnosis not present

## 2017-02-18 DIAGNOSIS — Z7901 Long term (current) use of anticoagulants: Secondary | ICD-10-CM | POA: Diagnosis not present

## 2017-02-19 DIAGNOSIS — G4733 Obstructive sleep apnea (adult) (pediatric): Secondary | ICD-10-CM | POA: Diagnosis not present

## 2017-02-24 DIAGNOSIS — Z7901 Long term (current) use of anticoagulants: Secondary | ICD-10-CM | POA: Diagnosis not present

## 2017-03-04 DIAGNOSIS — L03116 Cellulitis of left lower limb: Secondary | ICD-10-CM | POA: Diagnosis not present

## 2017-03-04 DIAGNOSIS — Z7901 Long term (current) use of anticoagulants: Secondary | ICD-10-CM | POA: Diagnosis not present

## 2017-03-04 DIAGNOSIS — Z5181 Encounter for therapeutic drug level monitoring: Secondary | ICD-10-CM | POA: Diagnosis not present

## 2017-03-04 DIAGNOSIS — I4891 Unspecified atrial fibrillation: Secondary | ICD-10-CM | POA: Diagnosis not present

## 2017-03-08 DIAGNOSIS — E11622 Type 2 diabetes mellitus with other skin ulcer: Secondary | ICD-10-CM | POA: Diagnosis not present

## 2017-03-08 DIAGNOSIS — I872 Venous insufficiency (chronic) (peripheral): Secondary | ICD-10-CM | POA: Diagnosis not present

## 2017-03-08 DIAGNOSIS — I10 Essential (primary) hypertension: Secondary | ICD-10-CM | POA: Diagnosis not present

## 2017-03-08 DIAGNOSIS — L97919 Non-pressure chronic ulcer of unspecified part of right lower leg with unspecified severity: Secondary | ICD-10-CM | POA: Diagnosis not present

## 2017-03-08 DIAGNOSIS — L97822 Non-pressure chronic ulcer of other part of left lower leg with fat layer exposed: Secondary | ICD-10-CM | POA: Diagnosis not present

## 2017-03-08 DIAGNOSIS — G4730 Sleep apnea, unspecified: Secondary | ICD-10-CM | POA: Diagnosis not present

## 2017-03-08 DIAGNOSIS — Z794 Long term (current) use of insulin: Secondary | ICD-10-CM | POA: Diagnosis not present

## 2017-03-08 DIAGNOSIS — M1990 Unspecified osteoarthritis, unspecified site: Secondary | ICD-10-CM | POA: Diagnosis not present

## 2017-03-10 ENCOUNTER — Other Ambulatory Visit: Payer: Self-pay | Admitting: Cardiology

## 2017-03-10 ENCOUNTER — Other Ambulatory Visit: Payer: Self-pay

## 2017-03-10 MED ORDER — FUROSEMIDE 40 MG PO TABS: 60.0000 mg | ORAL_TABLET | Freq: Every day | ORAL | 3 refills | Status: DC

## 2017-03-16 DIAGNOSIS — L97822 Non-pressure chronic ulcer of other part of left lower leg with fat layer exposed: Secondary | ICD-10-CM | POA: Diagnosis not present

## 2017-03-16 DIAGNOSIS — E11622 Type 2 diabetes mellitus with other skin ulcer: Secondary | ICD-10-CM | POA: Diagnosis not present

## 2017-03-16 DIAGNOSIS — I872 Venous insufficiency (chronic) (peripheral): Secondary | ICD-10-CM | POA: Diagnosis not present

## 2017-03-16 DIAGNOSIS — L97919 Non-pressure chronic ulcer of unspecified part of right lower leg with unspecified severity: Secondary | ICD-10-CM | POA: Diagnosis not present

## 2017-03-19 DIAGNOSIS — I509 Heart failure, unspecified: Secondary | ICD-10-CM | POA: Diagnosis not present

## 2017-03-23 DIAGNOSIS — E11622 Type 2 diabetes mellitus with other skin ulcer: Secondary | ICD-10-CM | POA: Diagnosis not present

## 2017-03-23 DIAGNOSIS — I872 Venous insufficiency (chronic) (peripheral): Secondary | ICD-10-CM | POA: Diagnosis not present

## 2017-03-23 DIAGNOSIS — L97822 Non-pressure chronic ulcer of other part of left lower leg with fat layer exposed: Secondary | ICD-10-CM | POA: Diagnosis not present

## 2017-03-24 DIAGNOSIS — L97229 Non-pressure chronic ulcer of left calf with unspecified severity: Secondary | ICD-10-CM | POA: Diagnosis not present

## 2017-03-24 DIAGNOSIS — E11622 Type 2 diabetes mellitus with other skin ulcer: Secondary | ICD-10-CM | POA: Diagnosis not present

## 2017-03-25 DIAGNOSIS — L97229 Non-pressure chronic ulcer of left calf with unspecified severity: Secondary | ICD-10-CM | POA: Diagnosis not present

## 2017-03-25 DIAGNOSIS — I872 Venous insufficiency (chronic) (peripheral): Secondary | ICD-10-CM | POA: Diagnosis not present

## 2017-03-25 DIAGNOSIS — Z96652 Presence of left artificial knee joint: Secondary | ICD-10-CM | POA: Diagnosis not present

## 2017-03-25 DIAGNOSIS — L7682 Other postprocedural complications of skin and subcutaneous tissue: Secondary | ICD-10-CM | POA: Diagnosis not present

## 2017-03-25 DIAGNOSIS — E11622 Type 2 diabetes mellitus with other skin ulcer: Secondary | ICD-10-CM | POA: Diagnosis not present

## 2017-03-28 DIAGNOSIS — Z7901 Long term (current) use of anticoagulants: Secondary | ICD-10-CM | POA: Diagnosis not present

## 2017-03-28 DIAGNOSIS — I4891 Unspecified atrial fibrillation: Secondary | ICD-10-CM | POA: Diagnosis not present

## 2017-03-28 DIAGNOSIS — Z5181 Encounter for therapeutic drug level monitoring: Secondary | ICD-10-CM | POA: Diagnosis not present

## 2017-03-29 DIAGNOSIS — E119 Type 2 diabetes mellitus without complications: Secondary | ICD-10-CM | POA: Diagnosis not present

## 2017-03-29 DIAGNOSIS — L97829 Non-pressure chronic ulcer of other part of left lower leg with unspecified severity: Secondary | ICD-10-CM | POA: Diagnosis not present

## 2017-03-29 DIAGNOSIS — Z09 Encounter for follow-up examination after completed treatment for conditions other than malignant neoplasm: Secondary | ICD-10-CM | POA: Diagnosis not present

## 2017-03-29 DIAGNOSIS — I872 Venous insufficiency (chronic) (peripheral): Secondary | ICD-10-CM | POA: Diagnosis not present

## 2017-05-16 ENCOUNTER — Other Ambulatory Visit: Payer: Self-pay | Admitting: Cardiology

## 2017-07-26 DIAGNOSIS — G8929 Other chronic pain: Secondary | ICD-10-CM | POA: Insufficient documentation

## 2017-07-27 DIAGNOSIS — Z0000 Encounter for general adult medical examination without abnormal findings: Secondary | ICD-10-CM | POA: Insufficient documentation

## 2017-07-27 DIAGNOSIS — Z7901 Long term (current) use of anticoagulants: Secondary | ICD-10-CM | POA: Insufficient documentation

## 2018-03-10 ENCOUNTER — Other Ambulatory Visit: Payer: Self-pay | Admitting: Cardiology

## 2018-04-05 ENCOUNTER — Other Ambulatory Visit: Payer: Self-pay | Admitting: Cardiology

## 2018-09-12 DIAGNOSIS — F5105 Insomnia due to other mental disorder: Secondary | ICD-10-CM | POA: Insufficient documentation

## 2018-09-12 DIAGNOSIS — F418 Other specified anxiety disorders: Secondary | ICD-10-CM | POA: Insufficient documentation

## 2018-09-19 ENCOUNTER — Other Ambulatory Visit: Payer: Self-pay | Admitting: Nephrology

## 2018-09-19 DIAGNOSIS — F322 Major depressive disorder, single episode, severe without psychotic features: Secondary | ICD-10-CM | POA: Insufficient documentation

## 2018-09-19 DIAGNOSIS — N184 Chronic kidney disease, stage 4 (severe): Secondary | ICD-10-CM

## 2018-09-22 ENCOUNTER — Ambulatory Visit
Admission: RE | Admit: 2018-09-22 | Discharge: 2018-09-22 | Disposition: A | Discharge status: Home / Self Care | Payer: Medicare Other | Source: Ambulatory Visit | Attending: Nephrology | Admitting: Nephrology

## 2018-09-22 ENCOUNTER — Other Ambulatory Visit: Payer: Self-pay

## 2018-09-22 DIAGNOSIS — N184 Chronic kidney disease, stage 4 (severe): Secondary | ICD-10-CM

## 2018-10-17 DIAGNOSIS — F03918 Unspecified dementia, unspecified severity, with other behavioral disturbance: Secondary | ICD-10-CM | POA: Insufficient documentation

## 2018-10-22 ENCOUNTER — Other Ambulatory Visit: Payer: Self-pay

## 2018-10-22 ENCOUNTER — Encounter (HOSPITAL_COMMUNITY): Payer: Self-pay | Admitting: *Deleted

## 2018-10-22 ENCOUNTER — Inpatient Hospital Stay (HOSPITAL_COMMUNITY)
Admission: EM | Admit: 2018-10-22 | Discharge: 2018-10-24 | DRG: 065 | Disposition: A | Discharge status: Home / Self Care | Payer: Medicare Other | Attending: Family Medicine | Admitting: Family Medicine

## 2018-10-22 DIAGNOSIS — F0391 Unspecified dementia with behavioral disturbance: Secondary | ICD-10-CM | POA: Diagnosis present

## 2018-10-22 DIAGNOSIS — R29704 NIHSS score 4: Secondary | ICD-10-CM | POA: Diagnosis present

## 2018-10-22 DIAGNOSIS — E669 Obesity, unspecified: Secondary | ICD-10-CM | POA: Diagnosis present

## 2018-10-22 DIAGNOSIS — Z794 Long term (current) use of insulin: Secondary | ICD-10-CM

## 2018-10-22 DIAGNOSIS — E1142 Type 2 diabetes mellitus with diabetic polyneuropathy: Secondary | ICD-10-CM | POA: Diagnosis present

## 2018-10-22 DIAGNOSIS — R2990 Unspecified symptoms and signs involving the nervous system: Secondary | ICD-10-CM | POA: Diagnosis present

## 2018-10-22 DIAGNOSIS — I429 Cardiomyopathy, unspecified: Secondary | ICD-10-CM | POA: Diagnosis present

## 2018-10-22 DIAGNOSIS — G4733 Obstructive sleep apnea (adult) (pediatric): Secondary | ICD-10-CM | POA: Diagnosis present

## 2018-10-22 DIAGNOSIS — Z79899 Other long term (current) drug therapy: Secondary | ICD-10-CM

## 2018-10-22 DIAGNOSIS — Z6838 Body mass index (BMI) 38.0-38.9, adult: Secondary | ICD-10-CM

## 2018-10-22 DIAGNOSIS — I4891 Unspecified atrial fibrillation: Secondary | ICD-10-CM | POA: Diagnosis present

## 2018-10-22 DIAGNOSIS — Z87891 Personal history of nicotine dependence: Secondary | ICD-10-CM

## 2018-10-22 DIAGNOSIS — E1122 Type 2 diabetes mellitus with diabetic chronic kidney disease: Secondary | ICD-10-CM | POA: Diagnosis present

## 2018-10-22 DIAGNOSIS — I6340 Cerebral infarction due to embolism of unspecified cerebral artery: Principal | ICD-10-CM | POA: Diagnosis present

## 2018-10-22 DIAGNOSIS — E876 Hypokalemia: Secondary | ICD-10-CM | POA: Diagnosis present

## 2018-10-22 DIAGNOSIS — I482 Chronic atrial fibrillation, unspecified: Secondary | ICD-10-CM | POA: Diagnosis present

## 2018-10-22 DIAGNOSIS — G8194 Hemiplegia, unspecified affecting left nondominant side: Secondary | ICD-10-CM | POA: Diagnosis present

## 2018-10-22 DIAGNOSIS — R278 Other lack of coordination: Secondary | ICD-10-CM | POA: Diagnosis not present

## 2018-10-22 DIAGNOSIS — IMO0001 Reserved for inherently not codable concepts without codable children: Secondary | ICD-10-CM

## 2018-10-22 DIAGNOSIS — Z96652 Presence of left artificial knee joint: Secondary | ICD-10-CM | POA: Diagnosis present

## 2018-10-22 DIAGNOSIS — Z9981 Dependence on supplemental oxygen: Secondary | ICD-10-CM

## 2018-10-22 DIAGNOSIS — I272 Pulmonary hypertension, unspecified: Secondary | ICD-10-CM | POA: Diagnosis present

## 2018-10-22 DIAGNOSIS — Z7901 Long term (current) use of anticoagulants: Secondary | ICD-10-CM

## 2018-10-22 DIAGNOSIS — N183 Chronic kidney disease, stage 3 unspecified: Secondary | ICD-10-CM | POA: Diagnosis present

## 2018-10-22 DIAGNOSIS — K5900 Constipation, unspecified: Secondary | ICD-10-CM | POA: Diagnosis present

## 2018-10-22 DIAGNOSIS — I130 Hypertensive heart and chronic kidney disease with heart failure and stage 1 through stage 4 chronic kidney disease, or unspecified chronic kidney disease: Secondary | ICD-10-CM | POA: Diagnosis present

## 2018-10-22 DIAGNOSIS — E1165 Type 2 diabetes mellitus with hyperglycemia: Secondary | ICD-10-CM | POA: Diagnosis present

## 2018-10-22 DIAGNOSIS — I639 Cerebral infarction, unspecified: Secondary | ICD-10-CM | POA: Diagnosis present

## 2018-10-22 DIAGNOSIS — Z1159 Encounter for screening for other viral diseases: Secondary | ICD-10-CM

## 2018-10-22 DIAGNOSIS — I5032 Chronic diastolic (congestive) heart failure: Secondary | ICD-10-CM | POA: Diagnosis present

## 2018-10-22 DIAGNOSIS — E785 Hyperlipidemia, unspecified: Secondary | ICD-10-CM | POA: Diagnosis present

## 2018-10-22 NOTE — ED Notes (Signed)
Charge rn spoke with pt daughter, acceptable to have 1 visitor d/t dementia. Call back number [phone]

## 2018-10-22 NOTE — ED Triage Notes (Signed)
[facility] reports that patient has left sided weakness after a 30 second episode of staring off with no response. Last seen normal was 8:30 pm. He was recently diagnosed with dementia and has been steadily becoming more aggressive. He went to a Neurologist last week about having possible mini strokes or mini seizures but refused EEG. He is at his recent baseline mentation. Last vital signs were 130/82, hr- 70s in afib.
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
## 2018-10-23 ENCOUNTER — Emergency Department (HOSPITAL_COMMUNITY): Payer: Medicare Other

## 2018-10-23 ENCOUNTER — Observation Stay (HOSPITAL_COMMUNITY): Payer: Medicare Other

## 2018-10-23 ENCOUNTER — Encounter (HOSPITAL_COMMUNITY): Payer: Self-pay | Admitting: Family Medicine

## 2018-10-23 ENCOUNTER — Inpatient Hospital Stay (HOSPITAL_COMMUNITY)
Admit: 2018-10-23 | Discharge: 2018-10-23 | Disposition: A | Discharge status: Home / Self Care | Payer: Medicare Other | Attending: Neurology | Admitting: Neurology

## 2018-10-23 ENCOUNTER — Inpatient Hospital Stay (HOSPITAL_COMMUNITY): Payer: Medicare Other

## 2018-10-23 DIAGNOSIS — Z87891 Personal history of nicotine dependence: Secondary | ICD-10-CM | POA: Diagnosis not present

## 2018-10-23 DIAGNOSIS — Z794 Long term (current) use of insulin: Secondary | ICD-10-CM | POA: Diagnosis not present

## 2018-10-23 DIAGNOSIS — R2990 Unspecified symptoms and signs involving the nervous system: Secondary | ICD-10-CM

## 2018-10-23 DIAGNOSIS — E1142 Type 2 diabetes mellitus with diabetic polyneuropathy: Secondary | ICD-10-CM | POA: Diagnosis present

## 2018-10-23 DIAGNOSIS — G8194 Hemiplegia, unspecified affecting left nondominant side: Secondary | ICD-10-CM | POA: Diagnosis present

## 2018-10-23 DIAGNOSIS — E669 Obesity, unspecified: Secondary | ICD-10-CM | POA: Diagnosis present

## 2018-10-23 DIAGNOSIS — E876 Hypokalemia: Secondary | ICD-10-CM | POA: Diagnosis present

## 2018-10-23 DIAGNOSIS — G4733 Obstructive sleep apnea (adult) (pediatric): Secondary | ICD-10-CM | POA: Diagnosis present

## 2018-10-23 DIAGNOSIS — N183 Chronic kidney disease, stage 3 unspecified: Secondary | ICD-10-CM | POA: Diagnosis present

## 2018-10-23 DIAGNOSIS — I429 Cardiomyopathy, unspecified: Secondary | ICD-10-CM | POA: Diagnosis present

## 2018-10-23 DIAGNOSIS — Z7901 Long term (current) use of anticoagulants: Secondary | ICD-10-CM | POA: Diagnosis not present

## 2018-10-23 DIAGNOSIS — I351 Nonrheumatic aortic (valve) insufficiency: Secondary | ICD-10-CM | POA: Diagnosis not present

## 2018-10-23 DIAGNOSIS — Z96652 Presence of left artificial knee joint: Secondary | ICD-10-CM | POA: Diagnosis present

## 2018-10-23 DIAGNOSIS — I4891 Unspecified atrial fibrillation: Secondary | ICD-10-CM | POA: Diagnosis not present

## 2018-10-23 DIAGNOSIS — I69354 Hemiplegia and hemiparesis following cerebral infarction affecting left non-dominant side: Secondary | ICD-10-CM | POA: Diagnosis not present

## 2018-10-23 DIAGNOSIS — E1122 Type 2 diabetes mellitus with diabetic chronic kidney disease: Secondary | ICD-10-CM | POA: Diagnosis present

## 2018-10-23 DIAGNOSIS — E1165 Type 2 diabetes mellitus with hyperglycemia: Secondary | ICD-10-CM | POA: Diagnosis present

## 2018-10-23 DIAGNOSIS — I361 Nonrheumatic tricuspid (valve) insufficiency: Secondary | ICD-10-CM | POA: Diagnosis not present

## 2018-10-23 DIAGNOSIS — E119 Type 2 diabetes mellitus without complications: Secondary | ICD-10-CM

## 2018-10-23 DIAGNOSIS — E785 Hyperlipidemia, unspecified: Secondary | ICD-10-CM | POA: Diagnosis present

## 2018-10-23 DIAGNOSIS — Z79899 Other long term (current) drug therapy: Secondary | ICD-10-CM | POA: Diagnosis not present

## 2018-10-23 DIAGNOSIS — F0391 Unspecified dementia with behavioral disturbance: Secondary | ICD-10-CM | POA: Diagnosis present

## 2018-10-23 DIAGNOSIS — I639 Cerebral infarction, unspecified: Secondary | ICD-10-CM | POA: Diagnosis present

## 2018-10-23 DIAGNOSIS — I6340 Cerebral infarction due to embolism of unspecified cerebral artery: Secondary | ICD-10-CM | POA: Diagnosis present

## 2018-10-23 DIAGNOSIS — I482 Chronic atrial fibrillation, unspecified: Secondary | ICD-10-CM | POA: Diagnosis present

## 2018-10-23 DIAGNOSIS — Z1159 Encounter for screening for other viral diseases: Secondary | ICD-10-CM | POA: Diagnosis not present

## 2018-10-23 DIAGNOSIS — R29704 NIHSS score 4: Secondary | ICD-10-CM | POA: Diagnosis present

## 2018-10-23 DIAGNOSIS — I5032 Chronic diastolic (congestive) heart failure: Secondary | ICD-10-CM | POA: Diagnosis present

## 2018-10-23 DIAGNOSIS — Z6838 Body mass index (BMI) 38.0-38.9, adult: Secondary | ICD-10-CM | POA: Diagnosis not present

## 2018-10-23 DIAGNOSIS — K5900 Constipation, unspecified: Secondary | ICD-10-CM | POA: Diagnosis present

## 2018-10-23 DIAGNOSIS — I130 Hypertensive heart and chronic kidney disease with heart failure and stage 1 through stage 4 chronic kidney disease, or unspecified chronic kidney disease: Secondary | ICD-10-CM | POA: Diagnosis present

## 2018-10-23 DIAGNOSIS — R278 Other lack of coordination: Secondary | ICD-10-CM | POA: Diagnosis present

## 2018-10-23 DIAGNOSIS — Z9981 Dependence on supplemental oxygen: Secondary | ICD-10-CM | POA: Diagnosis not present

## 2018-10-23 LAB — URINALYSIS, ROUTINE W REFLEX MICROSCOPIC
Bacteria, UA: NONE SEEN
Bilirubin Urine: NEGATIVE
Glucose, UA: >=500 mg/dL — AB
Hgb urine dipstick: NEGATIVE
Ketones, ur: 5 mg/dL — AB
Leukocytes,Ua: NEGATIVE
Nitrite: NEGATIVE
Protein, ur: 30 mg/dL — AB
Specific Gravity, Urine: 1.007 (ref 1.005–1.030)
pH: 5 (ref 5.0–8.0)

## 2018-10-23 LAB — MAGNESIUM: Magnesium: 2.1 mg/dL (ref 1.7–2.4)

## 2018-10-23 LAB — CBC
HCT: 43.4 % (ref 39.0–52.0)
Hemoglobin: 14.9 g/dL (ref 13.0–17.0)
MCH: 30.8 pg (ref 26.0–34.0)
MCHC: 34.3 g/dL (ref 30.0–36.0)
MCV: 89.7 fL (ref 80.0–100.0)
Platelets: 175 10*3/uL (ref 150–400)
RBC: 4.84 MIL/uL (ref 4.22–5.81)
RDW: 12.5 % (ref 11.5–15.5)
WBC: 7.6 10*3/uL (ref 4.0–10.5)
nRBC: 0 % (ref 0.0–0.2)

## 2018-10-23 LAB — BASIC METABOLIC PANEL
Anion gap: 14 (ref 5–15)
BUN: 43 mg/dL — ABNORMAL HIGH (ref 8–23)
CO2: 23 mmol/L (ref 22–32)
Calcium: 8.9 mg/dL (ref 8.9–10.3)
Chloride: 98 mmol/L (ref 98–111)
Creatinine, Ser: 1.92 mg/dL — ABNORMAL HIGH (ref 0.61–1.24)
GFR calc Af Amer: 37 mL/min — ABNORMAL LOW (ref >60)
GFR calc non Af Amer: 32 mL/min — ABNORMAL LOW (ref >60)
Glucose, Bld: 249 mg/dL — ABNORMAL HIGH (ref 70–99)
Potassium: 3.2 mmol/L — ABNORMAL LOW (ref 3.5–5.1)
Sodium: 135 mmol/L (ref 135–145)

## 2018-10-23 LAB — I-STAT CHEM 8, ED
BUN: 43 mg/dL — ABNORMAL HIGH (ref 8–23)
Calcium, Ion: 1.09 mmol/L — ABNORMAL LOW (ref 1.15–1.40)
Chloride: 101 mmol/L (ref 98–111)
Creatinine, Ser: 1.9 mg/dL — ABNORMAL HIGH (ref 0.61–1.24)
Glucose, Bld: 232 mg/dL — ABNORMAL HIGH (ref 70–99)
HCT: 44 % (ref 39.0–52.0)
Hemoglobin: 15 g/dL (ref 13.0–17.0)
Potassium: 3.2 mmol/L — ABNORMAL LOW (ref 3.5–5.1)
Sodium: 135 mmol/L (ref 135–145)
TCO2: 23 mmol/L (ref 22–32)

## 2018-10-23 LAB — DIFFERENTIAL
Abs Immature Granulocytes: 0.04 10*3/uL (ref 0.00–0.07)
Basophils Absolute: 0 10*3/uL (ref 0.0–0.1)
Basophils Relative: 0 %
Eosinophils Absolute: 0.2 10*3/uL (ref 0.0–0.5)
Eosinophils Relative: 2 %
Immature Granulocytes: 1 %
Lymphocytes Relative: 35 %
Lymphs Abs: 2.6 10*3/uL (ref 0.7–4.0)
Monocytes Absolute: 0.7 10*3/uL (ref 0.1–1.0)
Monocytes Relative: 10 %
Neutro Abs: 4 10*3/uL (ref 1.7–7.7)
Neutrophils Relative %: 52 %

## 2018-10-23 LAB — COMPREHENSIVE METABOLIC PANEL
ALT: 20 U/L (ref 0–44)
AST: 19 U/L (ref 15–41)
Albumin: 3.3 g/dL — ABNORMAL LOW (ref 3.5–5.0)
Alkaline Phosphatase: 63 U/L (ref 38–126)
Anion gap: 13 (ref 5–15)
BUN: 47 mg/dL — ABNORMAL HIGH (ref 8–23)
CO2: 23 mmol/L (ref 22–32)
Calcium: 9 mg/dL (ref 8.9–10.3)
Chloride: 99 mmol/L (ref 98–111)
Creatinine, Ser: 1.97 mg/dL — ABNORMAL HIGH (ref 0.61–1.24)
GFR calc Af Amer: 36 mL/min — ABNORMAL LOW (ref >60)
GFR calc non Af Amer: 31 mL/min — ABNORMAL LOW (ref >60)
Glucose, Bld: 231 mg/dL — ABNORMAL HIGH (ref 70–99)
Potassium: 3.2 mmol/L — ABNORMAL LOW (ref 3.5–5.1)
Sodium: 135 mmol/L (ref 135–145)
Total Bilirubin: 1 mg/dL (ref 0.3–1.2)
Total Protein: 6.4 g/dL — ABNORMAL LOW (ref 6.5–8.1)

## 2018-10-23 LAB — RAPID URINE DRUG SCREEN, HOSP PERFORMED
Amphetamines: NOT DETECTED
Barbiturates: NOT DETECTED
Benzodiazepines: NOT DETECTED
Cocaine: NOT DETECTED
Opiates: NOT DETECTED
Tetrahydrocannabinol: NOT DETECTED

## 2018-10-23 LAB — PROTIME-INR
INR: 2.1 — ABNORMAL HIGH (ref 0.8–1.2)
INR: 2.3 — ABNORMAL HIGH (ref 0.8–1.2)
Prothrombin Time: 23.6 seconds — ABNORMAL HIGH (ref 11.4–15.2)
Prothrombin Time: 24.8 seconds — ABNORMAL HIGH (ref 11.4–15.2)

## 2018-10-23 LAB — LIPID PANEL
Cholesterol: 212 mg/dL — ABNORMAL HIGH (ref 0–200)
HDL: 27 mg/dL — ABNORMAL LOW (ref >40)
LDL Cholesterol: 157 mg/dL — ABNORMAL HIGH (ref 0–99)
Total CHOL/HDL Ratio: 7.9 RATIO
Triglycerides: 140 mg/dL (ref <150)
VLDL: 28 mg/dL (ref 0–40)

## 2018-10-23 LAB — APTT: aPTT: 35 seconds (ref 24–36)

## 2018-10-23 LAB — GLUCOSE, CAPILLARY
Glucose-Capillary: 243 mg/dL — ABNORMAL HIGH (ref 70–99)
Glucose-Capillary: 205 mg/dL — ABNORMAL HIGH (ref 70–99)
Glucose-Capillary: 203 mg/dL — ABNORMAL HIGH (ref 70–99)
Glucose-Capillary: 194 mg/dL — ABNORMAL HIGH (ref 70–99)

## 2018-10-23 LAB — ETHANOL: Alcohol, Ethyl (B): <10 mg/dL (ref <10)

## 2018-10-23 LAB — SARS CORONAVIRUS 2 BY RT PCR (HOSPITAL ORDER, PERFORMED IN ~~LOC~~ HOSPITAL LAB): SARS Coronavirus 2: NEGATIVE

## 2018-10-23 MED ORDER — SODIUM CHLORIDE 0.9% FLUSH
3.0000 mL | Freq: Two times a day (BID) | INTRAVENOUS | Status: DC
  Administered 2018-10-23 (×2): 3 mL via INTRAVENOUS

## 2018-10-23 MED ORDER — STROKE: EARLY STAGES OF RECOVERY BOOK
Freq: Once | Status: DC
  Filled 2018-10-23: qty 1

## 2018-10-23 MED ORDER — ACETAMINOPHEN 160 MG/5ML PO SOLN: 650.0000 mg | ORAL | Status: DC | PRN

## 2018-10-23 MED ORDER — INSULIN ASPART 100 UNIT/ML ~~LOC~~ SOLN
0.0000 [IU] | Freq: Three times a day (TID) | SUBCUTANEOUS | Status: DC
  Administered 2018-10-23 – 2018-10-24 (×5): 3 [IU] via SUBCUTANEOUS
  Administered 2018-10-24: 2 [IU] via SUBCUTANEOUS

## 2018-10-23 MED ORDER — SODIUM CHLORIDE 0.9% FLUSH: 3.0000 mL | INTRAVENOUS | Status: DC | PRN

## 2018-10-23 MED ORDER — SENNOSIDES-DOCUSATE SODIUM 8.6-50 MG PO TABS: 1.0000 | ORAL_TABLET | Freq: Every evening | ORAL | Status: DC | PRN

## 2018-10-23 MED ORDER — FUROSEMIDE 80 MG PO TABS
80.0000 mg | ORAL_TABLET | Freq: Every day | ORAL | Status: DC
  Administered 2018-10-23 – 2018-10-24 (×2): 80 mg via ORAL
  Filled 2018-10-23 (×2): qty 1

## 2018-10-23 MED ORDER — INSULIN ASPART 100 UNIT/ML ~~LOC~~ SOLN: 0.0000 [IU] | Freq: Every day | SUBCUTANEOUS | Status: DC

## 2018-10-23 MED ORDER — INSULIN GLARGINE 100 UNIT/ML ~~LOC~~ SOLN
30.0000 [IU] | Freq: Every day | SUBCUTANEOUS | Status: DC
  Administered 2018-10-23: 30 [IU] via SUBCUTANEOUS
  Filled 2018-10-23 (×2): qty 0.3

## 2018-10-23 MED ORDER — ACETAMINOPHEN 325 MG PO TABS: 650.0000 mg | ORAL_TABLET | ORAL | Status: DC | PRN

## 2018-10-23 MED ORDER — POTASSIUM CHLORIDE CRYS ER 20 MEQ PO TBCR
20.0000 meq | EXTENDED_RELEASE_TABLET | Freq: Two times a day (BID) | ORAL | Status: DC
  Administered 2018-10-23 – 2018-10-24 (×3): 20 meq via ORAL
  Filled 2018-10-23 (×3): qty 1

## 2018-10-23 MED ORDER — ACETAMINOPHEN 650 MG RE SUPP: 650.0000 mg | RECTAL | Status: DC | PRN

## 2018-10-23 MED ORDER — WARFARIN SODIUM 6 MG PO TABS
6.0000 mg | ORAL_TABLET | Freq: Once | ORAL | Status: AC
  Administered 2018-10-23: 6 mg via ORAL
  Filled 2018-10-23: qty 1

## 2018-10-23 MED ORDER — ATORVASTATIN CALCIUM 10 MG PO TABS
20.0000 mg | ORAL_TABLET | Freq: Every day | ORAL | Status: DC
  Administered 2018-10-23 – 2018-10-24 (×2): 20 mg via ORAL
  Filled 2018-10-23 (×2): qty 2

## 2018-10-23 MED ORDER — SODIUM CHLORIDE 0.9 % IV SOLN
250.0000 mL | INTRAVENOUS | Status: DC | PRN
  Administered 2018-10-23: 10 mL via INTRAVENOUS

## 2018-10-23 MED ORDER — WARFARIN - PHARMACIST DOSING INPATIENT: Freq: Every day | Status: DC

## 2018-10-23 MED ORDER — POTASSIUM CHLORIDE 10 MEQ/100ML IV SOLN
10.0000 meq | INTRAVENOUS | Status: AC
  Administered 2018-10-23 (×2): 10 meq via INTRAVENOUS
  Filled 2018-10-23 (×2): qty 100
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
## 2018-10-23 NOTE — Evaluation (Signed)
Physical Therapy Evaluation Patient Details Name: Dennis Williams MRN: [id number] DOB: 06-29-1933 Today's Date: 10/23/2018   History of Present Illness  Pt is a 85 year old man admitted with L side weakness, CT negative for acute changes, MRI + for small acute R frontal infarct. PMH: afib on coumadin, CHF, DM, HTN, HLD, recent dx of dementia.  Clinical Impression  Orders received for PT evaluation. Patient demonstrates deficits in functional mobility as indicated below. Will benefit from continued skilled PT to address deficits and maximize function. Will see as indicated and progress as tolerated.  Patient with noted LLE deficit requiring need for use of RW and increased assist, may benefit from post acute rehabilitation upon discharge. Recommend CIR consult.    Follow Up Recommendations CIR    Equipment Recommendations       Recommendations for Other Services Rehab consult     Precautions / Restrictions Precautions Precautions: Fall Restrictions Weight Bearing Restrictions: No      Mobility  Bed Mobility Overal bed mobility: Needs Assistance Bed Mobility: Rolling;Supine to Sit Rolling: Min guard   Supine to sit: Min guard     General bed mobility comments: Increased time and effort to perform, no physical assist required  Transfers Overall transfer level: Needs assistance Equipment used: Rolling walker (2 wheeled) Transfers: Sit to/from Stand Sit to Stand: Min assist         General transfer comment: Min assist for stability during power to upright  Ambulation/Gait Ambulation/Gait assistance: Min assist Gait Distance (Feet): 18 Feet Assistive device: Rolling walker (2 wheeled) Gait Pattern/deviations: Step-through pattern;Decreased stride length;Decreased dorsiflexion - left;Trunk flexed Gait velocity: decreased   General Gait Details: patient with noted LLE deficits with noted left LE lag during ambulation attempt with noted difficulty during transition to  loading response. Worsens with fatigue  Stairs            Wheelchair Mobility    Modified Rankin (Stroke Patients Only) Modified Rankin (Stroke Patients Only) Pre-Morbid Rankin Score: No symptoms Modified Rankin: Moderately severe disability     Balance Overall balance assessment: Needs assistance;History of Falls Sitting-balance support: Feet supported Sitting balance-Leahy Scale: Fair     Standing balance support: During functional activity Standing balance-Leahy Scale: Poor Standing balance comment: reliance on UE support in standing                             Pertinent Vitals/Pain Pain Assessment: No/denies pain    Home Living Family/patient expects to be discharged to:: Private residence Living Arrangements: Spouse/significant other Available Help at Discharge: Family Type of Home: House Home Access: Level entry     Home Layout: One level Home Equipment: Environmental consultant - 2 wheels;Cane - single point;Grab bars - toilet;Grab bars - tub/shower      Prior Function Level of Independence: Independent with assistive device(s)         Comments: limited activity tolerance since knee surgery     Hand Dominance   Dominant Hand: Right    Extremity/Trunk Assessment        Lower Extremity Assessment Lower Extremity Assessment: LLE deficits/detail LLE Deficits / Details: patient with noted assymetrical weakness noted, worse during functional movement with diminished distal strength and noted LE lag  LLE Sensation: decreased light touch LLE Coordination: decreased fine motor;decreased gross motor       Communication   Communication: HOH  Cognition Arousal/Alertness: Awake/alert Behavior During Therapy: WFL for tasks assessed/performed Overall Cognitive Status: Impaired/Different  from baseline Area of Impairment: Problem solving                             Problem Solving: Slow processing;Requires verbal cues;Requires tactile cues         General Comments      Exercises     Assessment/Plan    PT Assessment Patient needs continued PT services  PT Problem List Decreased strength;Decreased activity tolerance;Decreased balance;Decreased mobility;Decreased coordination;Cardiopulmonary status limiting activity;Impaired sensation       PT Treatment Interventions DME instruction;Gait training;Stair training;Functional mobility training;Therapeutic activities;Therapeutic exercise;Balance training;Neuromuscular re-education;Cognitive remediation;Patient/family education    PT Goals (Current goals can be found in the Care Plan section)  Acute Rehab PT Goals Patient Stated Goal: to go home PT Goal Formulation: With patient Time For Goal Achievement: 11/06/18 Potential to Achieve Goals: Good    Frequency Min 4X/week   Barriers to discharge        Co-evaluation PT/OT/SLP Co-Evaluation/Treatment: Yes Reason for Co-Treatment: Complexity of the patient's impairments (multi-system involvement);For patient/therapist safety;To address functional/ADL transfers;Necessary to address cognition/behavior during functional activity PT goals addressed during session: Mobility/safety with mobility OT goals addressed during session: ADL's and self-care       AM-PAC PT "6 Clicks" Mobility  Outcome Measure Help needed turning from your back to your side while in a flat bed without using bedrails?: A Little Help needed moving from lying on your back to sitting on the side of a flat bed without using bedrails?: A Little Help needed moving to and from a bed to a chair (including a wheelchair)?: A Little Help needed standing up from a chair using your arms (e.g., wheelchair or bedside chair)?: A Little Help needed to walk in hospital room?: A Little Help needed climbing 3-5 steps with a railing? : A Lot 6 Click Score: 17    End of Session   Activity Tolerance: Patient tolerated treatment well Patient left: in chair;with call  bell/phone within reach;with chair alarm set Nurse Communication: Mobility status PT Visit Diagnosis: Difficulty in walking, not elsewhere classified (R26.2);Other symptoms and signs involving the nervous system (R29.898)    Time: 0131-4388 PT Time Calculation (min) (ACUTE ONLY): 22 min   Charges:   PT Evaluation $PT Eval Moderate Complexity: 1 Mod          Alben Deeds, PT DPT  Board Certified Neurologic Specialist Acute Rehabilitation Services Pager [phone] Office [phone]   Duncan Dull 10/23/2018, 11:23 AM
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
## 2018-10-23 NOTE — ED Notes (Signed)
ED Provider at bedside. 

## 2018-10-23 NOTE — Progress Notes (Signed)
EEG completed, results pending. 

## 2018-10-23 NOTE — ED Notes (Signed)
Spoke with MRI, okay to send pt to floor at this time, they will get him from floor when they are ready. Report called to [facility] on 51M.
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
## 2018-10-23 NOTE — Plan of Care (Signed)
  Problem: Coping: Goal: Will verbalize positive feelings about self Outcome: Progressing

## 2018-10-23 NOTE — Procedures (Signed)
  Margaret A. Merlene Laughter, MD     [email or web address]           HISTORY: The patient is an 85 year old who presents with recurrent episodes of staring and the weakness of the right side worrisome for seizures.  MEDICATIONS:  Current Facility-Administered Medications:  .   stroke: mapping our early stages of recovery book, , Does not apply, Once, Opyd, Timothy S, MD .  0.9 %  sodium chloride infusion, 250 mL, Intravenous, PRN, Opyd, Ilene Qua, MD, Last Rate: 10 mL/hr at 10/23/18 0411, 10 mL at 10/23/18 0411 .  acetaminophen (TYLENOL) tablet 650 mg, 650 mg, Oral, Q4H PRN **OR** acetaminophen (TYLENOL) solution 650 mg, 650 mg, Per Tube, Q4H PRN **OR** acetaminophen (TYLENOL) suppository 650 mg, 650 mg, Rectal, Q4H PRN, Opyd, Timothy S, MD .  atorvastatin (LIPITOR) tablet 20 mg, 20 mg, Oral, q1800, Opyd, Timothy S, MD, 20 mg at 10/23/18 1650 .  furosemide (LASIX) tablet 80 mg, 80 mg, Oral, Daily, Opyd, Ilene Qua, MD, 80 mg at 10/23/18 1650 .  insulin aspart (novoLOG) injection 0-5 Units, 0-5 Units, Subcutaneous, QHS, Opyd, Timothy S, MD .  insulin aspart (novoLOG) injection 0-9 Units, 0-9 Units, Subcutaneous, TID WC, Opyd, Ilene Qua, MD, 3 Units at 10/23/18 1218 .  insulin glargine (LANTUS) injection 30 Units, 30 Units, Subcutaneous, QHS, Opyd, Timothy S, MD .  potassium chloride SA (K-DUR) CR tablet 20 mEq, 20 mEq, Oral, BID, Opyd, Ilene Qua, MD, 20 mEq at 10/23/18 1650 .  senna-docusate (Senokot-S) tablet 1 tablet, 1 tablet, Oral, QHS PRN, Opyd, Timothy S, MD .  sodium chloride flush (NS) 0.9 % injection 3 mL, 3 mL, Intravenous, Q12H, Opyd, Ilene Qua, MD, 3 mL at 10/23/18 0442 .  sodium chloride flush (NS) 0.9 % injection 3 mL, 3 mL, Intravenous, PRN, Opyd, Ilene Qua, MD .  Warfarin - Pharmacist Dosing Inpatient, , Does not apply, q1800, Opyd, Ilene Qua, MD     ANALYSIS: A 16 channel recording using standard 10 20 measurements is conducted for 21 minutes.  The background  activity shows a well-formed alpha rhythm of 8.5 Hz which attenuates with eye opening.  There is beta activity observed in frontal areas.  Awake and drowsy architecture are observed with infrequent ill-defined K complexes.  There is episodic frontal intermittent rhythmic delta activity noted.  Photic stimulation and hyperventilation are not conducted.  There is no focal or lateral slowing.  There is no epileptiform activity is observed.   IMPRESSION: 1.  This recording of awake and drowsy state shows infrequent frontal intermittent rhythmic delta activity typically seen in toxic metabolic processes.  However, the recording is otherwise unrevealing.      Kanon Colunga A. Merlene Laughter, M.D.  Diplomate, Tax adviser of Psychiatry and Neurology ( Neurology).

## 2018-10-23 NOTE — Progress Notes (Signed)
STROKE TEAM PROGRESS NOTE   HISTORY OF PRESENT ILLNESS (per record) Dennis Williams is a 85 y.o. male with past medical history of atrial fibrillation on Coumadin, congestive heart failure, diabetes mellitus, hypertension, hyperlipidemia, recent diagnosis of dementia presents to the emergency room with left-sided weakness.  Patient last seen normal was around 2 PM on 5/24.  Patient woke up around 6 or 7 PM in the evening and appeared to have left arm weakness and difficulty walking.  History is obtained by chart review and discussion with the EDP as patient is a poor historian.  On examination, patient noted to have left hemiparesis.  A stat CT head obtained did not show any acute hemorrhage.  Patient has chronic atrial fibrillation and is on Coumadin with INR therapeutic at 2.1.  Patient was admitted to medicine service and neurology was consulted for concern for acute stroke.  Per EDP, patient also has been having recent behavioral change with multiple episodes of staring off and facial grimacing lasting for seconds to minutes.  Date last known well: 5.24.20 Time last known well: 2 PM tPA Given: No, outside window NIHSS: 4 Baseline MRS 0   SUBJECTIVE (INTERVAL HISTORY) His nurse is at bedside.  Patient appears to have baseline dementia and is a poor historian.   OBJECTIVE Vitals:   10/23/18 0248 10/23/18 0324 10/23/18 0506 10/23/18 0753  BP: 124/72 126/87 (!) 142/90 (!) 142/72  Pulse: 83 78 67 60  Resp: 16 17 18 18   Temp:  98.1 F (36.7 C) 97.8 F (36.6 C) 97.9 F (36.6 C)  TempSrc:  Oral Oral Oral  SpO2: 100% 100% 99% 97%    CBC:  Recent Labs  Lab 10/22/18 2349 10/23/18 0000  WBC 7.6  --   NEUTROABS 4.0  --   HGB 14.9 15.0  HCT 43.4 44.0  MCV 89.7  --   PLT 175  --     Basic Metabolic Panel:  Recent Labs  Lab 10/22/18 2349 10/23/18 0000 10/23/18 0516  NA 135 135 135  K 3.2* 3.2* 3.2*  CL 99 101 98  CO2 23  --  23  GLUCOSE 231* 232* 249*  BUN 47*  43* 43*  CREATININE 1.97* 1.90* 1.92*  CALCIUM 9.0  --  8.9  MG  --   --  2.1    Lipid Panel:     Component Value Date/Time   CHOL 212 (H) 10/23/2018 0516   TRIG 140 10/23/2018 0516   HDL 27 (L) 10/23/2018 0516   CHOLHDL 7.9 10/23/2018 0516   VLDL 28 10/23/2018 0516   LDLCALC 157 (H) 10/23/2018 0516   HgbA1c:  Lab Results  Component Value Date   HGBA1C 8.1 (H) 11/22/2016   Urine Drug Screen:     Component Value Date/Time   LABOPIA NONE DETECTED 10/23/2018 0046   COCAINSCRNUR NONE DETECTED 10/23/2018 0046   LABBENZ NONE DETECTED 10/23/2018 0046   AMPHETMU NONE DETECTED 10/23/2018 0046   THCU NONE DETECTED 10/23/2018 0046   LABBARB NONE DETECTED 10/23/2018 0046    Alcohol Level     Component Value Date/Time   ETH <10 10/22/2018 2349    IMAGING  Ct Head Wo Contrast 10/23/2018 IMPRESSION: No acute intracranial abnormality.   MRI / MRA Brain 10/23/2018 IMPRESSION: 1. Small acute posterior right frontal infarct. 2. Mild chronic small vessel ischemic disease. 3. Negative head MRA.  Transthoracic Echocardiogram  00/00/2020 Pending   Bilateral Carotid Dopplers  00/00/2020 Pending   EKG atrial fibrillation - ventricular response 77  BPM (See cardiology reading for complete details)    PHYSICAL EXAM Blood pressure (!) 142/72, pulse 60, temperature 97.9 F (36.6 C), temperature source Oral, resp. rate 18, SpO2 97 %.  Physical exam: Exam: Gen: NAD, conversant                   CV: RRR, no MRG. No Carotid Bruits. No peripheral edema, warm, nontender Eyes: Conjunctivae clear without exudates or hemorrhage  Neuro: Detailed Neurologic Exam  Speech:    Speech is normal; fluent and spontaneous with normal comprehension.  Cognition:    The patient is oriented to person, does not know month or year    recent and remote memory impaired;     language fluent;     Impaired attention, concentration,     fund of knowledge Cranial Nerves:    The pupils are  equal, round, and reactive to light.  Visual fields are full to finger confrontation. Extraocular movements are intact. Trigeminal sensation is intact and the muscles of mastication are normal. The face is symmetric. The palate elevates in the midline. Hearing intact. Voice is normal. Shoulder shrug is normal. The tongue has normal motion without fasciculations.   Coordination:    Dysmetria on the left FTN  Motor Observation:    No asymmetry, no atrophy, and no involuntary movements noted.  Strength:    Left lower extremity is anti-gravity with drift otherwise strength anti-gravity and equal     Sensation: intact to LT     Reflex Exam:   Toes:    The toes are equiv bilaterally.   Clonus:    Clonus is absent.        ASSESSMENT/PLAN [name] is a 85 y.o. male with history of atrial fibrillation on Coumadin (INR 2.1), congestive heart failure, cardiomyopathy, diabetes mellitus, hypertension, hyperlipidemia, recent diagnosis of dementia  presenting with left sided weakness. He did not receive IV t-PA due to late presentation (>4.5 hours from time of onset) and anticoagulation.  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 fib.  Resultant  LLE mild weakness  CT head - No acute intracranial abnormality.   MRI head - Small acute posterior right frontal infarct.  MRA head  - negative  CTA H&N - not performed  Carotid Doppler - will order  2D Echo - will order  Lacey Jensen Virus 2 - negative  LDL - 157  HgbA1c - 8.1  UDS  - negative  VTE prophylaxis - Warfarin  Diet - NPO  warfarin daily prior to admission, now on warfarin daily.    Patient counseled to be compliant with his antithrombotic medications  Ongoing aggressive stroke risk factor management  Therapy recommendations:  pending  Disposition:  Pending  Hypertension  Stable  Permissive hypertension (OK if < 220/120) but gradually normalize in 5-7  days . Long-term BP goal normotensive  Hyperlipidemia  Lipid lowering medication PTA:  Lipitor 20 mg daily  LDL 157, goal < 70  Current lipid lowering medication: Lipitor 20 mg daily - would increase to 80 mg daily  Continue statin at discharge  Diabetes  HgbA1c 8.1, goal < 7.0  Uncontrolled  Other Stroke Risk Factors  Advanced age  Former cigarette smoker - quit  Obesity, There is no height or weight on file to calculate BMI., recommend weight loss, diet and exercise as appropriate    Other Active Problems  Creatinine - 1.97->1.90->1.92  Hypokalemia - 3.2  Hospital day # 0  Personally examined patient and images, and have participated in and made any corrections needed to history, physical, neuro exam,assessment and plan as stated above.  I have personally obtained the history, evaluated lab date, reviewed imaging studies and agree with radiology interpretations.    Sarina Ill, MD Stroke Neurology   A total of 35 minutes was spent for the care of this patient, spent on counseling patient and family on different diagnostic and therapeutic options, counseling and coordination of care, riskd ans benefits of management, compliance, or risk factor reduction and education.   To contact Stroke Continuity provider, please refer to [email or web address] After hours, contact General Neurology

## 2018-10-23 NOTE — Progress Notes (Signed)
VASCULAR LAB PRELIMINARY  PRELIMINARY  PRELIMINARY  PRELIMINARY  Carotid duplex completed.    Preliminary report:  See CV proc for preliminary results.   Kadin Canipe, RVT 10/23/2018, 4:28 PM

## 2018-10-23 NOTE — ED Notes (Signed)
Pt wife arrived to the bedside.

## 2018-10-23 NOTE — Evaluation (Signed)
Occupational Therapy Evaluation Patient Details Name: Dennis Williams MRN: [id number] DOB: 1933-06-25 Today's Date: 10/23/2018    History of Present Illness Pt is a 85 year old man admitted with L side weakness, CT negative for acute changes, MRI + for small acute R frontal infarct. PMH: afib on coumadin, CHF, DM, HTN, HLD, recent dx of dementia.   Clinical Impression   Pt was independent in self care and ambulated with a cane. He reports sitting on the edge of the tub and bathing using hand held shower head. Pt presents with L side weakness, impaired standing balance requiring B UE support and slow processing. He requires set up to max assist for ADL and min assist for ambulation. Pt will need intensive rehab prior to return home with his supportive wife. Recommending CIR. Will follow acutely.    Follow Up Recommendations  CIR    Equipment Recommendations  3 in 1 bedside commode    Recommendations for Other Services       Precautions / Restrictions Precautions Precautions: Fall Restrictions Weight Bearing Restrictions: No      Mobility Bed Mobility Overal bed mobility: Needs Assistance Bed Mobility: Rolling;Supine to Sit Rolling: Min guard   Supine to sit: Min guard     General bed mobility comments: Increased time and effort to perform, no physical assist required  Transfers Overall transfer level: Needs assistance Equipment used: Rolling walker (2 wheeled) Transfers: Sit to/from Stand Sit to Stand: Min assist         General transfer comment: Min assist for stability during power to upright    Balance Overall balance assessment: Needs assistance;History of Falls Sitting-balance support: Feet supported Sitting balance-Leahy Scale: Fair     Standing balance support: During functional activity Standing balance-Leahy Scale: Poor Standing balance comment: reliance on UE support in standing                           ADL either performed or  assessed with clinical judgement   ADL Overall ADL's : Needs assistance/impaired Eating/Feeding: NPO Eating/Feeding Details (indicate cue type and reason): pt eager to eat, RN aware, awaiting SLP Grooming: Set up;Sitting Grooming Details (indicate cue type and reason): unable to release walker in static standing to perform at sink Upper Body Bathing: Minimal assistance;Sitting   Lower Body Bathing: Maximal assistance;Sit to/from stand   Upper Body Dressing : Minimal assistance;Sitting   Lower Body Dressing: Maximal assistance;Sit to/from stand   Toilet Transfer: Minimal assistance;Ambulation;RW;BSC   Toileting- Clothing Manipulation and Hygiene: Maximal assistance;Sit to/from stand       Functional mobility during ADLs: Minimal assistance;Rolling walker       Vision Baseline Vision/History: Wears glasses Wears Glasses: At all times Patient Visual Report: No change from baseline       Perception     Praxis      Pertinent Vitals/Pain Pain Assessment: No/denies pain     Hand Dominance Right   Extremity/Trunk Assessment Upper Extremity Assessment Upper Extremity Assessment: LUE deficits/detail LUE Deficits / Details: 4/5 shoulder, 4+/5 elbow flexion, 4/5 extension, 4/5 grip strength, + drift, able to perform alternating thumb to finger tip    Lower Extremity Assessment Lower Extremity Assessment: Defer to PT evaluation LLE Deficits / Details: patient with noted assymetrical weakness noted, worse during functional movement with diminished distal strength and noted LE lag  LLE Sensation: decreased light touch LLE Coordination: decreased fine motor;decreased gross motor       Communication Communication  Communication: HOH   Cognition Arousal/Alertness: Awake/alert Behavior During Therapy: WFL for tasks assessed/performed Overall Cognitive Status: Impaired/Different from baseline Area of Impairment: Problem solving                             Problem  Solving: Slow processing;Requires verbal cues;Requires tactile cues General Comments: pt with new dx of dementia   General Comments       Exercises     Shoulder Instructions      Home Living Family/patient expects to be discharged to:: Private residence Living Arrangements: Spouse/significant other Available Help at Discharge: Family Type of Home: House Home Access: Level entry     Home Layout: One level     Bathroom Shower/Tub: Teacher, early years/pre: Standard     Home Equipment: Environmental consultant - 2 wheels;Cane - single point;Grab bars - toilet;Grab bars - tub/shower;Hand held shower head          Prior Functioning/Environment Level of Independence: Independent with assistive device(s)        Comments: limited activity tolerance since knee surgery, used a cane        OT Problem List: Decreased strength;Impaired balance (sitting and/or standing);Decreased activity tolerance;Decreased cognition;Decreased knowledge of use of DME or AE;Increased edema      OT Treatment/Interventions: Self-care/ADL training;Neuromuscular education;DME and/or AE instruction;Therapeutic activities;Therapeutic exercise;Patient/family education;Balance training    OT Goals(Current goals can be found in the care plan section) Acute Rehab OT Goals Patient Stated Goal: to go home OT Goal Formulation: With patient Time For Goal Achievement: 11/06/18 Potential to Achieve Goals: Good ADL Goals Pt Will Perform Grooming: with supervision;standing Pt Will Perform Upper Body Bathing: with set-up;with supervision;sitting Pt Will Perform Lower Body Bathing: with min assist;sit to/from stand Pt Will Perform Upper Body Dressing: with set-up;with supervision;sitting Pt Will Perform Lower Body Dressing: with min assist;sit to/from stand Pt Will Transfer to Toilet: with supervision;ambulating;bedside commode(over toilet) Pt Will Perform Toileting - Clothing Manipulation and hygiene: with  supervision;sit to/from stand  OT Frequency: Min 3X/week   Barriers to D/C:            Co-evaluation PT/OT/SLP Co-Evaluation/Treatment: Yes Reason for Co-Treatment: Complexity of the patient's impairments (multi-system involvement) PT goals addressed during session: Mobility/safety with mobility OT goals addressed during session: ADL's and self-care      AM-PAC OT "6 Clicks" Daily Activity     Outcome Measure Help from another person eating meals?: None Help from another person taking care of personal grooming?: A Little Help from another person toileting, which includes using toliet, bedpan, or urinal?: A Lot Help from another person bathing (including washing, rinsing, drying)?: A Lot Help from another person to put on and taking off regular upper body clothing?: A Little Help from another person to put on and taking off regular lower body clothing?: A Lot 6 Click Score: 16   End of Session Equipment Utilized During Treatment: Gait belt;Rolling walker Nurse Communication: Mobility status;Other (comment)(aware of IV alarm)  Activity Tolerance: Patient tolerated treatment well Patient left: in chair;with call bell/phone within reach;with chair alarm set;with nursing/sitter in room  OT Visit Diagnosis: Unsteadiness on feet (R26.81);Other abnormalities of gait and mobility (R26.89);Muscle weakness (generalized) (M62.81);Hemiplegia and hemiparesis;Other symptoms and signs involving cognitive function Hemiplegia - Right/Left: Left Hemiplegia - dominant/non-dominant: Non-Dominant Hemiplegia - caused by: Cerebral infarction                Time: 4854-6270 OT Time Calculation (min): 22  min Charges:  OT General Charges $OT Visit: 1 Visit OT Evaluation $OT Eval Moderate Complexity: [place], OTR/L Acute Rehabilitation Services Pager: [phone] Office: [phone]  Malka So 10/23/2018, 11:31 AM

## 2018-10-23 NOTE — ED Notes (Signed)
Patient transported to CT 

## 2018-10-23 NOTE — Progress Notes (Signed)
Patient seen and examined and reviewed in addition to database reassessed independently  85 year old male with A. fib hypertension cognitive decline multiple other comorbidities presented with history as above MRI confirmed small posterior right frontal infarct  Therapy seen the patient and suggest CIR We will continue to follow holistically Check labs a.m. and replace potassium Do not know if with dementia any benefit to aggressive lipid-lowering management-is already on anticoagulation and do not think dual antiplatelet anticoagulant would be beneficial given his advanced age and dementia as high risk of bleeding   O/e Pleasant alert oriented to place but mixes up the year Finger-nose-finger test on left side is a little altered he past points Vision intact Smile symmetric Temporalis as well as frontalis intact Shoulder shrug intact Power 5/5 bilaterally 1+ edema on right side however slightly red on left Sensory intact to cold temperature S1-S2 A. fib rate controlled 60s 102 PVCs Chest clear no added sound Abdomen obese nontender no rebound No JVD   Called the patient's spouse and left a message with patient's daughter as spouse did not pick up

## 2018-10-23 NOTE — Progress Notes (Signed)
SLP Cancellation Note  Patient Details Name: Dennis Williams MRN: [id number] DOB: [birth year]   Cancelled treatment:       Reason Eval/Treat Not Completed: Patient at procedure or test/unavailable(Pt currently with RN. SLP will follow up on subsequent date.)  [name] I. Hardin Negus, [place], [place] Office number [phone] Pager [phone]  Horton Marshall 10/23/2018, 4:49 PM

## 2018-10-23 NOTE — Progress Notes (Signed)
SLP Cancellation Note  Patient Details Name: Dennis Williams MRN: [id number] DOB: [birth year]   Cancelled treatment:       Reason Eval/Treat Not Completed: Patient at procedure or test/unavailable(Pt currently having procedure in room. SLP will follow up. )  Kloey Cazarez I. Hardin Negus, [place], [place] Office number [phone] Pager [phone]  Horton Marshall 10/23/2018, 3:52 PM

## 2018-10-24 ENCOUNTER — Inpatient Hospital Stay (HOSPITAL_COMMUNITY)
Admission: RE | Admit: 2018-10-24 | Discharge: 2018-11-03 | DRG: 057 | Disposition: A | Discharge status: Home Health | Payer: Medicare Other | Source: Intra-hospital | Attending: Physical Medicine & Rehabilitation | Admitting: Physical Medicine & Rehabilitation

## 2018-10-24 ENCOUNTER — Encounter (HOSPITAL_COMMUNITY): Payer: Self-pay

## 2018-10-24 ENCOUNTER — Inpatient Hospital Stay (HOSPITAL_COMMUNITY): Payer: Medicare Other

## 2018-10-24 ENCOUNTER — Other Ambulatory Visit: Payer: Self-pay

## 2018-10-24 DIAGNOSIS — Z79899 Other long term (current) drug therapy: Secondary | ICD-10-CM

## 2018-10-24 DIAGNOSIS — E1122 Type 2 diabetes mellitus with diabetic chronic kidney disease: Secondary | ICD-10-CM | POA: Diagnosis present

## 2018-10-24 DIAGNOSIS — G4700 Insomnia, unspecified: Secondary | ICD-10-CM | POA: Diagnosis not present

## 2018-10-24 DIAGNOSIS — R269 Unspecified abnormalities of gait and mobility: Secondary | ICD-10-CM | POA: Diagnosis not present

## 2018-10-24 DIAGNOSIS — I69398 Other sequelae of cerebral infarction: Secondary | ICD-10-CM | POA: Diagnosis not present

## 2018-10-24 DIAGNOSIS — E785 Hyperlipidemia, unspecified: Secondary | ICD-10-CM | POA: Diagnosis present

## 2018-10-24 DIAGNOSIS — G4733 Obstructive sleep apnea (adult) (pediatric): Secondary | ICD-10-CM | POA: Diagnosis present

## 2018-10-24 DIAGNOSIS — F03918 Unspecified dementia, unspecified severity, with other behavioral disturbance: Secondary | ICD-10-CM

## 2018-10-24 DIAGNOSIS — I351 Nonrheumatic aortic (valve) insufficiency: Secondary | ICD-10-CM

## 2018-10-24 DIAGNOSIS — E1142 Type 2 diabetes mellitus with diabetic polyneuropathy: Secondary | ICD-10-CM | POA: Diagnosis present

## 2018-10-24 DIAGNOSIS — F0391 Unspecified dementia with behavioral disturbance: Secondary | ICD-10-CM | POA: Diagnosis not present

## 2018-10-24 DIAGNOSIS — I4891 Unspecified atrial fibrillation: Secondary | ICD-10-CM | POA: Diagnosis present

## 2018-10-24 DIAGNOSIS — I5032 Chronic diastolic (congestive) heart failure: Secondary | ICD-10-CM | POA: Diagnosis present

## 2018-10-24 DIAGNOSIS — E876 Hypokalemia: Secondary | ICD-10-CM | POA: Diagnosis present

## 2018-10-24 DIAGNOSIS — F0390 Unspecified dementia without behavioral disturbance: Secondary | ICD-10-CM | POA: Diagnosis not present

## 2018-10-24 DIAGNOSIS — Z794 Long term (current) use of insulin: Secondary | ICD-10-CM | POA: Diagnosis not present

## 2018-10-24 DIAGNOSIS — Z9981 Dependence on supplemental oxygen: Secondary | ICD-10-CM | POA: Diagnosis not present

## 2018-10-24 DIAGNOSIS — N189 Chronic kidney disease, unspecified: Secondary | ICD-10-CM | POA: Diagnosis present

## 2018-10-24 DIAGNOSIS — Z7901 Long term (current) use of anticoagulants: Secondary | ICD-10-CM

## 2018-10-24 DIAGNOSIS — I361 Nonrheumatic tricuspid (valve) insufficiency: Secondary | ICD-10-CM

## 2018-10-24 DIAGNOSIS — I69354 Hemiplegia and hemiparesis following cerebral infarction affecting left non-dominant side: Principal | ICD-10-CM

## 2018-10-24 DIAGNOSIS — Z96652 Presence of left artificial knee joint: Secondary | ICD-10-CM | POA: Diagnosis present

## 2018-10-24 DIAGNOSIS — N184 Chronic kidney disease, stage 4 (severe): Secondary | ICD-10-CM | POA: Diagnosis not present

## 2018-10-24 DIAGNOSIS — N183 Chronic kidney disease, stage 3 (moderate): Secondary | ICD-10-CM | POA: Diagnosis not present

## 2018-10-24 DIAGNOSIS — Z87891 Personal history of nicotine dependence: Secondary | ICD-10-CM

## 2018-10-24 DIAGNOSIS — I130 Hypertensive heart and chronic kidney disease with heart failure and stage 1 through stage 4 chronic kidney disease, or unspecified chronic kidney disease: Secondary | ICD-10-CM | POA: Diagnosis present

## 2018-10-24 DIAGNOSIS — I639 Cerebral infarction, unspecified: Secondary | ICD-10-CM | POA: Diagnosis present

## 2018-10-24 LAB — GLUCOSE, CAPILLARY
Glucose-Capillary: 199 mg/dL — ABNORMAL HIGH (ref 70–99)
Glucose-Capillary: 213 mg/dL — ABNORMAL HIGH (ref 70–99)
Glucose-Capillary: 209 mg/dL — ABNORMAL HIGH (ref 70–99)
Glucose-Capillary: 163 mg/dL — ABNORMAL HIGH (ref 70–99)

## 2018-10-24 LAB — CBC WITH DIFFERENTIAL/PLATELET
Abs Immature Granulocytes: 0.03 10*3/uL (ref 0.00–0.07)
Basophils Absolute: 0 10*3/uL (ref 0.0–0.1)
Basophils Relative: 1 %
Eosinophils Absolute: 0.2 10*3/uL (ref 0.0–0.5)
Eosinophils Relative: 2 %
HCT: 44 % (ref 39.0–52.0)
Hemoglobin: 14.7 g/dL (ref 13.0–17.0)
Immature Granulocytes: 0 %
Lymphocytes Relative: 22 %
Lymphs Abs: 1.5 10*3/uL (ref 0.7–4.0)
MCH: 30.2 pg (ref 26.0–34.0)
MCHC: 33.4 g/dL (ref 30.0–36.0)
MCV: 90.5 fL (ref 80.0–100.0)
Monocytes Absolute: 0.6 10*3/uL (ref 0.1–1.0)
Monocytes Relative: 9 %
Neutro Abs: 4.7 10*3/uL (ref 1.7–7.7)
Neutrophils Relative %: 66 %
Platelets: 172 10*3/uL (ref 150–400)
RBC: 4.86 MIL/uL (ref 4.22–5.81)
RDW: 12.8 % (ref 11.5–15.5)
WBC: 7 10*3/uL (ref 4.0–10.5)
nRBC: 0 % (ref 0.0–0.2)

## 2018-10-24 LAB — RENAL FUNCTION PANEL
Albumin: 3 g/dL — ABNORMAL LOW (ref 3.5–5.0)
Anion gap: 8 (ref 5–15)
BUN: 34 mg/dL — ABNORMAL HIGH (ref 8–23)
CO2: 26 mmol/L (ref 22–32)
Calcium: 9 mg/dL (ref 8.9–10.3)
Chloride: 105 mmol/L (ref 98–111)
Creatinine, Ser: 1.83 mg/dL — ABNORMAL HIGH (ref 0.61–1.24)
GFR calc Af Amer: 39 mL/min — ABNORMAL LOW (ref >60)
GFR calc non Af Amer: 34 mL/min — ABNORMAL LOW (ref >60)
Glucose, Bld: 217 mg/dL — ABNORMAL HIGH (ref 70–99)
Phosphorus: 3.3 mg/dL (ref 2.5–4.6)
Potassium: 3.4 mmol/L — ABNORMAL LOW (ref 3.5–5.1)
Sodium: 139 mmol/L (ref 135–145)

## 2018-10-24 LAB — ECHOCARDIOGRAM COMPLETE BUBBLE STUDY
Height: 66 in
Weight: 3848 oz

## 2018-10-24 LAB — PROTIME-INR
INR: 2.2 — ABNORMAL HIGH (ref 0.8–1.2)
Prothrombin Time: 24.4 seconds — ABNORMAL HIGH (ref 11.4–15.2)

## 2018-10-24 MED ORDER — SENNOSIDES-DOCUSATE SODIUM 8.6-50 MG PO TABS
1.0000 | ORAL_TABLET | Freq: Every evening | ORAL | Status: DC | PRN
  Administered 2018-11-01: 1 via ORAL
  Filled 2018-10-24: qty 1

## 2018-10-24 MED ORDER — WARFARIN SODIUM 3 MG PO TABS: 6.0000 mg | ORAL_TABLET | Freq: Once | ORAL | Status: DC

## 2018-10-24 MED ORDER — POTASSIUM CHLORIDE CRYS ER 20 MEQ PO TBCR: 20.0000 meq | EXTENDED_RELEASE_TABLET | Freq: Two times a day (BID) | ORAL | Status: DC

## 2018-10-24 MED ORDER — INSULIN ASPART 100 UNIT/ML ~~LOC~~ SOLN: 0.0000 [IU] | Freq: Three times a day (TID) | SUBCUTANEOUS | 11 refills | Status: DC

## 2018-10-24 MED ORDER — ATORVASTATIN CALCIUM 10 MG PO TABS
20.0000 mg | ORAL_TABLET | Freq: Every day | ORAL | Status: DC
  Administered 2018-10-25 – 2018-10-26 (×2): 20 mg via ORAL
  Filled 2018-10-24 (×2): qty 2

## 2018-10-24 MED ORDER — FUROSEMIDE 40 MG PO TABS
80.0000 mg | ORAL_TABLET | Freq: Every day | ORAL | Status: DC
  Administered 2018-10-25 – 2018-10-27 (×3): 80 mg via ORAL
  Filled 2018-10-24 (×3): qty 2

## 2018-10-24 MED ORDER — INSULIN ASPART 100 UNIT/ML ~~LOC~~ SOLN
0.0000 [IU] | Freq: Three times a day (TID) | SUBCUTANEOUS | Status: DC
  Administered 2018-10-25: 07:00:00 1 [IU] via SUBCUTANEOUS
  Administered 2018-10-25: 12:00:00 3 [IU] via SUBCUTANEOUS
  Administered 2018-10-25 – 2018-10-28 (×10): 2 [IU] via SUBCUTANEOUS
  Administered 2018-10-29: 1 [IU] via SUBCUTANEOUS
  Administered 2018-10-29 – 2018-10-30 (×3): 2 [IU] via SUBCUTANEOUS
  Administered 2018-10-30 – 2018-11-02 (×4): 1 [IU] via SUBCUTANEOUS

## 2018-10-24 MED ORDER — ACETAMINOPHEN 650 MG RE SUPP: 650.0000 mg | RECTAL | Status: DC | PRN

## 2018-10-24 MED ORDER — INSULIN GLARGINE 100 UNIT/ML ~~LOC~~ SOLN: 30.0000 [IU] | Freq: Every day | SUBCUTANEOUS | 11 refills | Status: DC

## 2018-10-24 MED ORDER — WARFARIN SODIUM 6 MG PO TABS
6.0000 mg | ORAL_TABLET | Freq: Once | ORAL | Status: DC
  Administered 2018-10-24: 6 mg via ORAL
  Filled 2018-10-24: qty 1

## 2018-10-24 MED ORDER — INSULIN ASPART 100 UNIT/ML ~~LOC~~ SOLN: 0.0000 [IU] | Freq: Every day | SUBCUTANEOUS | 11 refills | Status: DC

## 2018-10-24 MED ORDER — ACETAMINOPHEN 325 MG PO TABS: 650.0000 mg | ORAL_TABLET | ORAL | Status: DC | PRN

## 2018-10-24 MED ORDER — INSULIN GLARGINE 100 UNIT/ML ~~LOC~~ SOLN
30.0000 [IU] | Freq: Every day | SUBCUTANEOUS | Status: DC
  Administered 2018-10-24 – 2018-10-27 (×4): 30 [IU] via SUBCUTANEOUS
  Filled 2018-10-24 (×5): qty 0.3

## 2018-10-24 MED ORDER — FUROSEMIDE 80 MG PO TABS: 80.0000 mg | ORAL_TABLET | Freq: Every day | ORAL | Status: DC

## 2018-10-24 MED ORDER — ACETAMINOPHEN 325 MG PO TABS: 650.0000 mg | ORAL_TABLET | ORAL | Status: AC | PRN

## 2018-10-24 MED ORDER — WARFARIN - PHARMACIST DOSING INPATIENT
Freq: Every day | Status: DC
  Administered 2018-10-25 – 2018-10-28 (×2)

## 2018-10-24 MED ORDER — ACETAMINOPHEN 160 MG/5ML PO SOLN: 650.0000 mg | ORAL | Status: DC | PRN
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
## 2018-10-24 NOTE — Progress Notes (Signed)
Inpatient Diabetes Program Recommendations  AACE/ADA: New Consensus Statement on Inpatient Glycemic Control (2015)  Target Ranges:  Prepandial:   less than 140 mg/dL      Peak postprandial:   less than 180 mg/dL (1-2 hours)      Critically ill patients:  140 - 180 mg/dL   Lab Results  Component Value Date   GLUCAP 213 (H) 10/24/2018   HGBA1C 8.1 (H) 11/22/2016    Review of Glycemic Control Results for Dennis Williams, Dennis Williams (MRN [id number]) as of 10/24/2018 12:50  Ref. Range 10/23/2018 21:41 10/24/2018 06:55 10/24/2018 11:11  Glucose-Capillary Latest Ref Range: 70 - 99 mg/dL 194 (H) 199 (H) 213 (H)   Diabetes history: Type 2 DM Outpatient Diabetes medications: Lantus 70 units QHS, Humalog 10 unit TID Current orders for Inpatient glycemic control: Lantus 30 units QHS, Novolog 0-9 units TID, Novolog 0-5 units QHS  Inpatient Diabetes Program Recommendations:    Consider increasing Lantus to 35 units QHS as glucose trends exceed inpatient goals of 180 mg/dL.   Thanks, Bronson Curb, MSN, RNC-OB Diabetes Coordinator [phone] (8a-5p)
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
## 2018-10-24 NOTE — Progress Notes (Signed)
STROKE TEAM PROGRESS NOTE     SUBJECTIVE (INTERVAL HISTORY) He has no complaints.  Patient appears to have baseline dementia and is a poor historian.   OBJECTIVE Vitals:   10/24/18 0440 10/24/18 0841 10/24/18 1445 10/24/18 1709  BP: 136/75 116/63  (!) 143/85  Pulse: 62 65  75  Resp: 16 16  20   Temp: 97.6 F (36.4 C) 98 F (36.7 C)  98.5 F (36.9 C)  TempSrc: Oral Oral  Oral  SpO2: 96% 95%  98%  Weight:   109.1 kg   Height:   5\' 6"  (1.676 m)     CBC:  Recent Labs  Lab 10/22/18 2349 10/23/18 0000 10/24/18 0451  WBC 7.6  --  7.0  NEUTROABS 4.0  --  4.7  HGB 14.9 15.0 14.7  HCT 43.4 44.0 44.0  MCV 89.7  --  90.5  PLT 175  --  326    Basic Metabolic Panel:  Recent Labs  Lab 10/23/18 0516 10/24/18 0451  NA 135 139  K 3.2* 3.4*  CL 98 105  CO2 23 26  GLUCOSE 249* 217*  BUN 43* 34*  CREATININE 1.92* 1.83*  CALCIUM 8.9 9.0  MG 2.1  --   PHOS  --  3.3    Lipid Panel:     Component Value Date/Time   CHOL 212 (H) 10/23/2018 0516   TRIG 140 10/23/2018 0516   HDL 27 (L) 10/23/2018 0516   CHOLHDL 7.9 10/23/2018 0516   VLDL 28 10/23/2018 0516   LDLCALC 157 (H) 10/23/2018 0516   HgbA1c:  Lab Results  Component Value Date   HGBA1C 8.1 (H) 11/22/2016   Urine Drug Screen:     Component Value Date/Time   LABOPIA NONE DETECTED 10/23/2018 0046   COCAINSCRNUR NONE DETECTED 10/23/2018 0046   LABBENZ NONE DETECTED 10/23/2018 0046   AMPHETMU NONE DETECTED 10/23/2018 0046   THCU NONE DETECTED 10/23/2018 0046   LABBARB NONE DETECTED 10/23/2018 0046    Alcohol Level     Component Value Date/Time   ETH <10 10/22/2018 2349    IMAGING  Ct Head Wo Contrast 10/23/2018 IMPRESSION: No acute intracranial abnormality.   MRI / MRA Brain 10/23/2018 IMPRESSION: 1. Small acute posterior right frontal infarct. 2. Mild chronic small vessel ischemic disease. 3. Negative head MRA.  Transthoracic Echocardiogram  Normal left ventricular ejection fraction 60 to  65%   Bilateral Carotid Dopplers  No significant b/l stenosis   EKG atrial fibrillation - ventricular response 77 BPM (See cardiology reading for complete details)    PHYSICAL EXAM Blood pressure (!) 143/85, pulse 75, temperature 98.5 F (36.9 C), temperature source Oral, resp. rate 20, height 5\' 6"  (1.676 m), weight 109.1 kg, SpO2 98 %.  Pleasant elderly African-American male not in distress.  Sitting up comfortably in a bedside chair. . Afebrile. Head is nontraumatic. Neck is supple without bruit.    Cardiac exam no murmur or gallop. Lungs are clear to auscultation. Distal pulses are well felt.   Neurological Exam.  He is pleasant awake alert cooperative.  He has diminished attention, registration and recall.  His poor insight into his illness.  He follows simple midline and one-step commands.  Extraocular movements are full range without nystagmus.  Face is symmetric without weakness.  Tongue is midline.  Motor system exam shows symmetric upper extremity strength with only slight diminished fine finger movements on the left.  He has mild lower extremity weakness on the left hip flexors and ankle dorsiflexors only.  Gait not tested.     ASSESSMENT/PLAN [name] is a 85 y.o. male with history of atrial fibrillation on Coumadin (INR 2.1), congestive heart failure, cardiomyopathy, diabetes mellitus, hypertension, hyperlipidemia, recent diagnosis of dementia  presenting with left sided weakness. He did not receive IV t-PA due to late presentation (>4.5 hours from time of onset) and anticoagulation.  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 fib.  Resultant  LLE mild weakness  CT head - No acute intracranial abnormality.   MRI head - Small acute posterior right frontal infarct.  MRA head  - negative  CTA H&N - not performed  Carotid Doppler - will order  2D Echo - will order  Lacey Jensen Virus 2 - negative  LDL - 157  HgbA1c  - 8.1  UDS  - negative  VTE prophylaxis - Warfarin  Diet - NPO  warfarin daily prior to admission, now on warfarin daily.    Patient counseled to be compliant with his antithrombotic medications  Ongoing aggressive stroke risk factor management  Therapy recommendations:  pending  Disposition:  Pending  Hypertension  Stable  Permissive hypertension (OK if < 220/120) but gradually normalize in 5-7 days . Long-term BP goal normotensive  Hyperlipidemia  Lipid lowering medication PTA:  Lipitor 20 mg daily  LDL 157, goal < 70  Current lipid lowering medication: Lipitor 20 mg daily - would increase to 80 mg daily  Continue statin at discharge  Diabetes  HgbA1c 8.1, goal < 7.0  Uncontrolled  Other Stroke Risk Factors  Advanced age  Former cigarette smoker - quit  Obesity, Body mass index is 38.82 kg/m., recommend weight loss, diet and exercise as appropriate    Other Active Problems  Creatinine - 1.97->1.90->1.92  Hypokalemia - 3.2      Hospital day # 2  Recommend continue warfarin for stroke prevention.  Transfer for rehabilitation when bed available.  Discussed with Dr. Verlon Au.  Stroke team will sign off.  Kindly call for questions. Antony Contras, MD    To contact Stroke Continuity provider, please refer to [email or web address] After hours, contact General Neurology

## 2018-10-24 NOTE — Progress Notes (Signed)
Physical Therapy Treatment Patient Details Name: Dennis Williams MRN: [id number] DOB: [birth year] Today's Date: 10/24/2018    History of Present Illness Pt is an 85 y.o. man admitted 10/22/18 with L side weakness; LKW time at 2 PM on 5/24, outside of tPA window. MRI revealed small acute posterior R frontal infarct; mild chronic small vessel ischemic disease. EEG 5/25 showed infrequent frontal intermittent rhythmic delta activity typically seen in toxic metabolic processes. PMH includes afib on coumadin, CHF, DM, HTN, HLD, recent dx of dementia.   PT Comments    Pt progressing with mobility. Noted increased fatigue this session but remains agreeable to participate with therapies. Pt needing to have BM when initiating gait training, therefore today's session focused on repeated sit<>stands and LE strengthening. Pt requiring min-modA for mobility. Pt with increased flat affect this session, ready to return home as soon as possible. Remains agreeable to CIR-level therapies.   Follow Up Recommendations  CIR;Supervision for mobility/OOB     Equipment Recommendations  (TBD)    Recommendations for Other Services Rehab consult     Precautions / Restrictions Precautions Precautions: Fall Restrictions Weight Bearing Restrictions: No    Mobility  Bed Mobility               General bed mobility comments: Received sitting in recliner  Transfers Overall transfer level: Needs assistance Equipment used: Rolling walker (2 wheeled) Transfers: Sit to/from Stand Sit to Stand: Mod assist;Min assist         General transfer comment: Pt requiring initial modA for trunk elevation and to maintain balance when transitioning BUEs from recliner to RW; increased time and effort to do this. Performed 3x more standing trials, progressing to minA. Pt easily fatigued requiring prolonged seated rest break during this  Ambulation/Gait Ambulation/Gait assistance: Min assist;Mod assist Gait Distance  (Feet): 4 Feet Assistive device: Rolling walker (2 wheeled) Gait Pattern/deviations: Step-through pattern;Trunk flexed;Decreased weight shift to left;Decreased dorsiflexion - left Gait velocity: Decreased   General Gait Details: Slow, fatigued steps with RW and min-modA for balance; pt with forward flexed posture and difficulty translating LLE forward; cues for sequencing and upright posture. Further distance deferred secondary to pt suddenly needing to have BM   Stairs             Wheelchair Mobility    Modified Rankin (Stroke Patients Only) Modified Rankin (Stroke Patients Only) Pre-Morbid Rankin Score: No symptoms Modified Rankin: Moderately severe disability     Balance Overall balance assessment: Needs assistance;History of Falls Sitting-balance support: Feet supported Sitting balance-Leahy Scale: Fair     Standing balance support: During functional activity Standing balance-Leahy Scale: Poor Standing balance comment: Reliant on UE support                            Cognition Arousal/Alertness: Awake/alert Behavior During Therapy: Flat affect Overall Cognitive Status: Impaired/Different from baseline Area of Impairment: Problem solving                             Problem Solving: Slow processing;Requires verbal cues;Requires tactile cues General Comments: Pt with new dx of dementia      Exercises General Exercises - Lower Extremity Long Arc Quad: AROM;Both;Seated Hip Flexion/Marching: AROM;Both;Seated    General Comments        Pertinent Vitals/Pain Pain Assessment: No/denies pain    Home Living  Prior Function            PT Goals (current goals can now be found in the care plan section) Acute Rehab PT Goals Patient Stated Goal: to go home PT Goal Formulation: With patient Time For Goal Achievement: 11/06/18 Potential to Achieve Goals: Good Progress towards PT goals: Progressing toward  goals    Frequency    Min 4X/week      PT Plan Current plan remains appropriate    Co-evaluation              AM-PAC PT "6 Clicks" Mobility   Outcome Measure  Help needed turning from your back to your side while in a flat bed without using bedrails?: A Little Help needed moving from lying on your back to sitting on the side of a flat bed without using bedrails?: A Little Help needed moving to and from a bed to a chair (including a wheelchair)?: A Little Help needed standing up from a chair using your arms (e.g., wheelchair or bedside chair)?: A Little Help needed to walk in hospital room?: A Lot Help needed climbing 3-5 steps with a railing? : A Lot 6 Click Score: 16    End of Session Equipment Utilized During Treatment: Gait belt Activity Tolerance: Patient tolerated treatment well;Patient limited by fatigue Patient left: in chair;with call bell/phone within reach;with chair alarm set Nurse Communication: Mobility status PT Visit Diagnosis: Difficulty in walking, not elsewhere classified (R26.2);Other symptoms and signs involving the nervous system (R29.898)     Time: 1346-1410 PT Time Calculation (min) (ACUTE ONLY): 24 min  Charges:  $Therapeutic Exercise: 8-22 mins $Therapeutic Activity: 8-22 mins                    Mabeline Caras, PT, DPT Acute Rehabilitation Services  Pager [phone] Office [place] 10/24/2018, 2:21 PM

## 2018-10-24 NOTE — Progress Notes (Signed)
  Echocardiogram 2D Echocardiogram has been performed.  Bobbye Charleston 10/24/2018, 4:56 PM

## 2018-10-24 NOTE — H&P (Signed)
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 The internal carotid arteries are widely patent from skull base to carotid termini. ACAs and MCAs are patent without evidence of proximal branch occlusion or significant proximal stenosis. No aneurysm is identified. IMPRESSION: 1. Small acute posterior right frontal infarct. 2. Mild chronic small vessel ischemic disease. 3. Negative head MRA. Electronically Signed   By: Logan Bores M.D.   On: 10/23/2018 09:36   Mr Brain Wo Contrast  Result Date: 10/23/2018 CLINICAL DATA:  Acute left-sided weakness. EXAM: MRI HEAD WITHOUT CONTRAST MRA HEAD WITHOUT CONTRAST TECHNIQUE: Multiplanar, multiecho pulse sequences of the  brain and surrounding structures were obtained without intravenous contrast. Angiographic images of the head were obtained using MRA technique  without contrast. COMPARISON:  Head CT 10/23/2018 and MRI 09/01/2018 FINDINGS: MRI HEAD FINDINGS The study is mildly motion degraded. Brain: There is a small acute cortical and subcortical infarction in the posterior right frontal lobe which involves the precentral gyrus. Scattered cerebral white matter T2 hyperintensities are similar to the prior MRI and nonspecific but compatible with mild chronic small vessel ischemic disease. A chronic lacunar infarct is again noted in the right thalamus. Cerebral atrophy is not greater than expected for age. Vascular: Major intracranial vascular flow voids are preserved. Skull and upper cervical spine: Unremarkable bone marrow signal. Sinuses/Orbits: Unremarkable orbits. Paranasal sinuses and mastoid air cells are clear. Other: None. MRA HEAD FINDINGS The visualized distal vertebral arteries are widely patent to the basilar with the left being mildly dominant. Patent PICA and SCA origins are identified bilaterally. The basilar artery is widely patent. Posterior communicating arteries are not well seen and may be diminutive or absent. PCAs are patent without evidence of significant proximal stenosis. The internal carotid arteries are widely patent from skull base to carotid termini. ACAs and MCAs are patent without evidence of proximal branch occlusion or significant proximal stenosis. No aneurysm is identified. IMPRESSION: 1. Small acute posterior right frontal infarct. 2. Mild chronic small vessel ischemic disease. 3. Negative head MRA. Electronically Signed   By: Logan Bores M.D.   On: 10/23/2018 09:36   Vas US Carotid  Result Date: 10/23/2018 Carotid Arterial Duplex Study Indications:       CVA, Weakness and Gait disturbance. Risk Factors:      Hypertension, hyperlipidemia, Diabetes, past history of                    smoking.  Other Factors:     Atrial fibrillation, on Coumadin. Recent diagnosis of                    dementia with behavior disturbance. Comparison Study:  No prior study on file for comparison. Performing Technologist: Sharion Dove RVS  Examination Guidelines: A complete evaluation includes B-mode imaging, spectral Doppler, color Doppler, and power Doppler as needed of all accessible portions of each vessel. Bilateral testing is considered an integral part of a complete examination. Limited examinations for reoccurring indications may be performed as noted.  Right Carotid Findings: +----------+--------+--------+--------+------------+---------+             PSV cm/s EDV cm/s Stenosis Describe     Comments   +----------+--------+--------+--------+------------+---------+  CCA Prox   96       15                                        +----------+--------+--------+--------+------------+---------+  CCA Distal 81       15                heterogenous            +----------+--------+--------+--------+------------+---------+  ICA Prox   72       16                calcific     Shadowing  +----------+--------+--------+--------+------------+---------+  ICA Distal 64       10                                        +----------+--------+--------+--------+------------+---------+  ECA        115      14                                        +----------+--------+--------+--------+------------+---------+ +----------+--------+-------+--------+-------------------+             PSV cm/s EDV cms Describe Arm Pressure (mmHG)  +----------+--------+-------+--------+-------------------+  Subclavian 116                                            +----------+--------+-------+--------+-------------------+ +---------+--------+--+--------+-+  Vertebral PSV cm/s 27 EDV cm/s 8  +---------+--------+--+--------+-+  Left Carotid Findings: +----------+--------+--------+--------+------------+------------------+             PSV cm/s EDV cm/s Stenosis Describe      Comments            +----------+--------+--------+--------+------------+------------------+  CCA Prox   101      18                             intimal thickening  +----------+--------+--------+--------+------------+------------------+  CCA Distal 71       12                homogeneous                      +----------+--------+--------+--------+------------+------------------+  ICA Prox   109      13                heterogenous                     +----------+--------+--------+--------+------------+------------------+  ICA Distal 81       24                                                 +----------+--------+--------+--------+------------+------------------+  ECA        141      22                                                 +----------+--------+--------+--------+------------+------------------+ +----------+--------+--------+--------+-------------------+  Subclavian PSV cm/s EDV cm/s Describe Arm Pressure (mmHG)  +----------+--------+--------+--------+-------------------+             71                                              +----------+--------+--------+--------+-------------------+ +---------+--------+--+--------+-+  Vertebral PSV cm/s 56 EDV cm/s 4  +---------+--------+--+--------+-+  Summary: Right Carotid: Velocities in the right ICA are consistent with a 1-39% stenosis. Left Carotid: Velocities in the left ICA are consistent with a 1-39% stenosis. Vertebrals:  Bilateral vertebral arteries demonstrate antegrade flow. Subclavians: Normal flow hemodynamics were seen in bilateral subclavian              arteries. *See table(s) above for measurements and observations.     Preliminary  Medical Problem List and Plan: 1.  Left-sided weakness secondary to small acute posterior right frontal infarction  -admit to inpatient rehab 2.  Antithrombotics: -DVT/anticoagulation: Chronic Coumadin  -antiplatelet therapy: N/A 3. Pain Management: Tylenol as needed 4. Mood: Recent diagnosis of  dementia  -antipsychotic agents: N/A 5. Neuropsych: This patient is capable of making decisions on his own behalf. 6. Skin/Wound Care: Routine skin checks 7. Fluids/Electrolytes/Nutrition: Routine in and outs with follow-up chemistries 8.  Atrial fibrillation.  Cardiac rate controlled.  Continue Coumadin. 9.  Diabetes mellitus with peripheral neuropathy.  Lantus insulin 30 units nightly.  Check blood sugars before meals and at bedtime 10.  Chronic diastolic congestive heart failure.  Monitor for any signs of fluid overload.  Lasix 80 mg daily. 11.  Hyperlipidemia.  Lipitor 12.  History of tobacco use.  Provide counseling 13.  OSA.  Continue CPAP 14.  Chronic renal insufficiency with baseline creatinine 1.83-1.97. 15.  Question alcohol use.  Monitor for any signs of withdrawal and provide counseling     Cathlyn Parsons, PA-C 10/24/2018

## 2018-10-24 NOTE — Progress Notes (Signed)
Inpatient Rehab Admissions:  Inpatient Rehab Consult received.  I met with patient at the bedside for rehabilitation assessment and to discuss goals and expectations of an inpatient rehab admission.  He is open to CIR and asked that I talk to his wife.  I spoke to Joaquim Lai and she is hopeful for CIR admission pending insurance authorization.  She is able to provide 24/7 support to patient at discharge.  I will open insurance and plan for admission possibly later today or tomorrow pending authorization.   Signed: Shann Medal, PT, DPT Admissions Coordinator [phone] 10/24/18  12:21 PM

## 2018-10-24 NOTE — Progress Notes (Signed)
Inpatient Rehabilitation  Patient information reviewed and entered into eRehab system by Oluwaseun Cremer M. Lillianne Eick, M.A., CCC/SLP, PPS Coordinator.  Information including medical coding, functional ability and quality indicators will be reviewed and updated through discharge.    
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
## 2018-10-24 NOTE — Progress Notes (Addendum)
Pt transferred to inpt rehab. Wife was notified and pt is stable. AVS printed and reviewed with pt. Report was called to RN.   Paulla Fore, RN

## 2018-10-24 NOTE — Discharge Summary (Signed)
Physician Discharge Summary  Dennis Williams OTL:[id number] DOB: [birth year] DOA: 10/22/2018  PCP: Myrlene Broker, MD  Admit date: 10/22/2018 Discharge date: 10/24/2018  Time spent: 25 minutes  Recommendations for Outpatient Follow-up:  1. Check INR, Chem-12 1 week 2. Consider use of BiPAP at night looks like he may have had a premorbid diagnosis of sleep apnea 3. Needs multiple modalities of therapy at [facility]  Discharge Diagnoses:  Principal Problem:   Stroke-like symptoms Active Problems:   Atrial fibrillation (HCC)   Chronic diastolic CHF (congestive heart failure) (HCC)   Insulin dependent diabetes mellitus (HCC)   CKD (chronic kidney disease), stage III (HCC)   Hypokalemia   CVA (cerebral vascular accident) [facility])   Discharge Condition: Improved  Diet recommendation: Heart healthy  Filed Weights   10/24/18 1445  Weight: 109.1 kg    History of present illness:  85 year old male with A. fib hypertension cognitive decline multiple other comorbidities inclusive of may be obstructive sleep apnea hyperlipidemia MRI confirmed small posterior right frontal infarct Neurology saw the patient in consult Therapy services saw the patient and felt he would benefit from inpatient rehab  The patient stabilized over the course of stay Carotids did not reveal anything Echo is pending at time of discharge needs to be followed up but I spoke personally with Dr. Leonie Man of neurology who felt this would not change management and dual antiplatelet anticoagulation was not indicated The patient has A. fib and his stroke is probably secondary to a small lacunar infarct    Procedures:  Echocardiogram  Carotid Dopplers  Consultations:  Neurology  See  Discharge Exam: Vitals:   10/24/18 0440 10/24/18 0841  BP: 136/75 116/63  Pulse: 62 65  Resp: 16 16  Temp: 97.6 F (36.4 C) 98 F (36.7 C)  SpO2: 96% 95%    General: Awake alert pleasant sitting in chair no distress thick neck  Mallampati 2 No JVD Poor dentition External ocular movements intact Shrug symmetric Cardiovascular: S1-S2 A. fib on monitor is rate controlled Respiratory: Clinically clear no rales no rhonchi no TVR no TVF Past-pointing on left side Otherwise no weakness   Discharge Instructions   Discharge Instructions    Diet - low sodium heart healthy   Complete by:  As directed    Increase activity slowly   Complete by:  As directed      Allergies as of 10/24/2018      Reactions   Dilaudid [hydromorphone Hcl] Nausea And Vomiting, Other (See Comments)   Felt hot   No Known Allergies       Medication List    STOP taking these medications   carvedilol 25 MG tablet Commonly known as:  COREG   cholecalciferol 1000 units tablet Commonly known as:  VITAMIN D   digoxin 0.25 MG tablet Commonly known as:  LANOXIN   enoxaparin 150 MG/ML injection Commonly known as:  LOVENOX   glucosamine-chondroitin 500-400 MG tablet   hydrOXYzine 10 MG tablet Commonly known as:  ATARAX/VISTARIL   insulin lispro 100 UNIT/ML injection Commonly known as:  HUMALOG   LINZESS PO   magnesium citrate Soln   methocarbamol 500 MG tablet Commonly known as:  ROBAXIN   Oxycodone HCl 10 MG Tabs   OXYGEN   potassium chloride 10 MEQ tablet Commonly known as:  K-DUR   PRESCRIPTION MEDICATION   RED YEAST RICE PO   silver sulfADIAZINE 1 % cream Commonly known as:  SILVADENE     TAKE these medications   acetaminophen  325 MG tablet Commonly known as:  TYLENOL Take 2 tablets (650 mg total) by mouth every 4 (four) hours as needed for mild pain (or temp > 37.5 C (99.5 F)). What changed:    medication strength  how much to take  when to take this  reasons to take this   atorvastatin 20 MG tablet Commonly known as:  LIPITOR Take 20 mg by mouth daily.   furosemide 80 MG tablet Commonly known as:  LASIX Take 1 tablet (80 mg total) by mouth daily. Start taking on:  Oct 25, 2018 What  changed:    medication strength  how much to take   insulin aspart 100 UNIT/ML injection Commonly known as:  novoLOG Inject 0-9 Units into the skin 3 (three) times daily with meals.   insulin aspart 100 UNIT/ML injection Commonly known as:  novoLOG Inject 0-5 Units into the skin at bedtime.   insulin glargine 100 UNIT/ML injection Commonly known as:  LANTUS Inject 0.3 mLs (30 Units total) into the skin at bedtime. What changed:  how much to take   potassium chloride SA 20 MEQ tablet Commonly known as:  K-DUR Take 1 tablet (20 mEq total) by mouth 2 (two) times daily.   Senna S 8.6-50 MG tablet Generic drug:  senna-docusate Take 2-3 tablets by mouth at bedtime.   warfarin 6 MG tablet Commonly known as:  COUMADIN Take 3 mg by mouth See admin instructions. Take 1/2 tablet (3 mg) by mouth every other day with 1/2 1 mg tablet (0.5 mg) for a 3.5 mg dose What changed:  Another medication with the same name was removed. Continue taking this medication, and follow the directions you see here.      Allergies  Allergen Reactions  . Dilaudid [Hydromorphone Hcl] Nausea And Vomiting and Other (See Comments)    Felt hot  . No Known Allergies       The results of significant diagnostics from this hospitalization (including imaging, microbiology, ancillary and laboratory) are listed below for reference.    Significant Diagnostic Studies: Ct Head Wo Contrast  Result Date: 10/23/2018 CLINICAL DATA:  Left-sided weakness EXAM: CT HEAD WITHOUT CONTRAST TECHNIQUE: Contiguous axial images were obtained from the base of the skull through the vertex without intravenous contrast. COMPARISON:  None. FINDINGS: Brain: No evidence of acute infarction, hemorrhage, hydrocephalus, extra-axial collection or mass lesion/mass effect. There is mild age related volume loss as well as microvascular ischemic changes bilaterally. There is slight increased hypoattenuation involving the right anterior temporal  lobe, likely artifactual. Vascular: No hyperdense vessel or unexpected calcification. Skull: Normal. Negative for fracture or focal lesion. Sinuses/Orbits: No acute finding. Other: None. IMPRESSION: No acute intracranial abnormality. Electronically Signed   By: Constance Holster M.D.   On: 10/23/2018 01:19   Mr [name] Head Wo Contrast  Result Date: 10/23/2018 CLINICAL DATA:  Acute left-sided weakness. EXAM: MRI HEAD WITHOUT CONTRAST MRA HEAD WITHOUT CONTRAST TECHNIQUE: Multiplanar, multiecho pulse sequences of the brain and surrounding structures were obtained without intravenous contrast. Angiographic images of the head were obtained using MRA technique without contrast. COMPARISON:  Head CT 10/23/2018 and MRI 09/01/2018 FINDINGS: MRI HEAD FINDINGS The study is mildly motion degraded. Brain: There is a small acute cortical and subcortical infarction in the posterior right frontal lobe which involves the precentral gyrus. Scattered cerebral white matter T2 hyperintensities are similar to the prior MRI and nonspecific but compatible with mild chronic small vessel ischemic disease. A chronic lacunar infarct is again noted in the  right thalamus. Cerebral atrophy is not greater than expected for age. Vascular: Major intracranial vascular flow voids are preserved. Skull and upper cervical spine: Unremarkable bone marrow signal. Sinuses/Orbits: Unremarkable orbits. Paranasal sinuses and mastoid air cells are clear. Other: None. MRA HEAD FINDINGS The visualized distal vertebral arteries are widely patent to the basilar with the left being mildly dominant. Patent PICA and SCA origins are identified bilaterally. The basilar artery is widely patent. Posterior communicating arteries are not well seen and may be diminutive or absent. PCAs are patent without evidence of significant proximal stenosis. The internal carotid arteries are widely patent from skull base to carotid termini. ACAs and MCAs are patent without evidence of  proximal branch occlusion or significant proximal stenosis. No aneurysm is identified. IMPRESSION: 1. Small acute posterior right frontal infarct. 2. Mild chronic small vessel ischemic disease. 3. Negative head MRA. Electronically Signed   By: Logan Bores M.D.   On: 10/23/2018 09:36   Mr Brain Wo Contrast  Result Date: 10/23/2018 CLINICAL DATA:  Acute left-sided weakness. EXAM: MRI HEAD WITHOUT CONTRAST MRA HEAD WITHOUT CONTRAST TECHNIQUE: Multiplanar, multiecho pulse sequences of the brain and surrounding structures were obtained without intravenous contrast. Angiographic images of the head were obtained using MRA technique without contrast. COMPARISON:  Head CT 10/23/2018 and MRI 09/01/2018 FINDINGS: MRI HEAD FINDINGS The study is mildly motion degraded. Brain: There is a small acute cortical and subcortical infarction in the posterior right frontal lobe which involves the precentral gyrus. Scattered cerebral white matter T2 hyperintensities are similar to the prior MRI and nonspecific but compatible with mild chronic small vessel ischemic disease. A chronic lacunar infarct is again noted in the right thalamus. Cerebral atrophy is not greater than expected for age. Vascular: Major intracranial vascular flow voids are preserved. Skull and upper cervical spine: Unremarkable bone marrow signal. Sinuses/Orbits: Unremarkable orbits. Paranasal sinuses and mastoid air cells are clear. Other: None. MRA HEAD FINDINGS The visualized distal vertebral arteries are widely patent to the basilar with the left being mildly dominant. Patent PICA and SCA origins are identified bilaterally. The basilar artery is widely patent. Posterior communicating arteries are not well seen and may be diminutive or absent. PCAs are patent without evidence of significant proximal stenosis. The internal carotid arteries are widely patent from skull base to carotid termini. ACAs and MCAs are patent without evidence of proximal branch occlusion  or significant proximal stenosis. No aneurysm is identified. IMPRESSION: 1. Small acute posterior right frontal infarct. 2. Mild chronic small vessel ischemic disease. 3. Negative head MRA. Electronically Signed   By: Logan Bores M.D.   On: 10/23/2018 09:36   Vas US Carotid  Result Date: 10/24/2018 Carotid Arterial Duplex Study Indications:       CVA, Weakness and Gait disturbance. Risk Factors:      Hypertension, hyperlipidemia, Diabetes, past history of                    smoking. Other Factors:     Atrial fibrillation, on Coumadin. Recent diagnosis of                    dementia with behavior disturbance. Comparison Study:  No prior study on file for comparison. Performing Technologist: Sharion Dove RVS  Examination Guidelines: A complete evaluation includes B-mode imaging, spectral Doppler, color Doppler, and power Doppler as needed of all accessible portions of each vessel. Bilateral testing is considered an integral part of a complete examination. Limited examinations for reoccurring indications  may be performed as noted.  Right Carotid Findings: +----------+--------+--------+--------+------------+---------+           PSV cm/sEDV cm/sStenosisDescribe    Comments  +----------+--------+--------+--------+------------+---------+ CCA Prox  96      15                                    +----------+--------+--------+--------+------------+---------+ CCA Distal81      15              heterogenous          +----------+--------+--------+--------+------------+---------+ ICA Prox  72      16              calcific    Shadowing +----------+--------+--------+--------+------------+---------+ ICA Distal64      10                                    +----------+--------+--------+--------+------------+---------+ ECA       115     14                                    +----------+--------+--------+--------+------------+---------+  +----------+--------+-------+--------+-------------------+           PSV cm/sEDV cmsDescribeArm Pressure (mmHG) +----------+--------+-------+--------+-------------------+ Subclavian116                                        +----------+--------+-------+--------+-------------------+ +---------+--------+--+--------+-+ VertebralPSV cm/s27EDV cm/s8 +---------+--------+--+--------+-+  Left Carotid Findings: +----------+--------+--------+--------+------------+------------------+           PSV cm/sEDV cm/sStenosisDescribe    Comments           +----------+--------+--------+--------+------------+------------------+ CCA Prox  101     18                          intimal thickening +----------+--------+--------+--------+------------+------------------+ CCA Distal71      12              homogeneous                    +----------+--------+--------+--------+------------+------------------+ ICA Prox  109     13              heterogenous                   +----------+--------+--------+--------+------------+------------------+ ICA Distal81      24                                             +----------+--------+--------+--------+------------+------------------+ ECA       141     22                                             +----------+--------+--------+--------+------------+------------------+ +----------+--------+--------+--------+-------------------+ SubclavianPSV cm/sEDV cm/sDescribeArm Pressure (mmHG) +----------+--------+--------+--------+-------------------+           71                                          +----------+--------+--------+--------+-------------------+ +---------+--------+--+--------+-+  VertebralPSV cm/s56EDV cm/s4 +---------+--------+--+--------+-+  Summary: Right Carotid: Velocities in the right ICA are consistent with a 1-39% stenosis. Left Carotid: Velocities in the left ICA are consistent with a 1-39% stenosis. Vertebrals:   Bilateral vertebral arteries demonstrate antegrade flow. Subclavians: Normal flow hemodynamics were seen in bilateral subclavian              arteries. *See table(s) above for measurements and observations.  Electronically signed by Antony Contras MD on 10/24/2018 at 12:46:39 PM.    Final     Microbiology: Recent Results (from the past 240 hour(s))  SARS Coronavirus 2 (CEPHEID - Performed in [place] [facility] lab), Hosp Order     Status: None   Collection Time: 10/23/18 12:33 AM  Result Value Ref Range Status   SARS Coronavirus 2 NEGATIVE NEGATIVE Final    Comment: (NOTE) If result is NEGATIVE SARS-CoV-2 target nucleic acids are NOT DETECTED. The SARS-CoV-2 RNA is generally detectable in upper and lower  respiratory specimens during the acute phase of infection. The lowest  concentration of SARS-CoV-2 viral copies this assay can detect is 250  copies / mL. A negative result does not preclude SARS-CoV-2 infection  and should not be used as the sole basis for treatment or other  patient management decisions.  A negative result may occur with  improper specimen collection / handling, submission of specimen other  than nasopharyngeal swab, presence of viral mutation(s) within the  areas targeted by this assay, and inadequate number of viral copies  (<250 copies / mL). A negative result must be combined with clinical  observations, patient history, and epidemiological information. If result is POSITIVE SARS-CoV-2 target nucleic acids are DETECTED. The SARS-CoV-2 RNA is generally detectable in upper and lower  respiratory specimens dur ing the acute phase of infection.  Positive  results are indicative of active infection with SARS-CoV-2.  Clinical  correlation with patient history and other diagnostic information is  necessary to determine patient infection status.  Positive results do  not rule out bacterial infection or co-infection with other viruses. If result is PRESUMPTIVE  POSTIVE SARS-CoV-2 nucleic acids MAY BE PRESENT.   A presumptive positive result was obtained on the submitted specimen  and confirmed on repeat testing.  While 2019 novel coronavirus  (SARS-CoV-2) nucleic acids may be present in the submitted sample  additional confirmatory testing may be necessary for epidemiological  and / or clinical management purposes  to differentiate between  SARS-CoV-2 and other Sarbecovirus currently known to infect humans.  If clinically indicated additional testing with an alternate test  methodology [id number]) is advised. The SARS-CoV-2 RNA is generally  detectable in upper and lower respiratory sp ecimens during the acute  phase of infection. The expected result is Negative. Fact Sheet for Patients:  StrictlyIdeas.no Fact Sheet for Healthcare Providers: BankingDealers.co.za This test is not yet approved or cleared by the [place] FDA and has been authorized for detection and/or diagnosis of SARS-CoV-2 by FDA under an Emergency Use Authorization (EUA).  This EUA will remain in effect (meaning this test can be used) for the duration of the COVID-19 declaration under Section 564(b)(1) of the Act, 21 U.S.C. section 360bbb-3(b)(1), unless the authorization is terminated or revoked sooner. Performed at [facility], [place] [address], [place], [place] [address]      Labs: Basic Metabolic Panel: Recent Labs  Lab 10/22/18 2349 10/23/18 0000 10/23/18 0516 10/24/18 0451  NA 135 135 135 139  K 3.2* 3.2* 3.2* 3.4*  CL 99  101 98 105  CO2 23  --  23 26  GLUCOSE 231* 232* 249* 217*  BUN 47* 43* 43* 34*  CREATININE 1.97* 1.90* 1.92* 1.83*  CALCIUM 9.0  --  8.9 9.0  MG  --   --  2.1  --   PHOS  --   --   --  3.3   Liver Function Tests: Recent Labs  Lab 10/22/18 2349 10/24/18 0451  AST 19  --   ALT 20  --   ALKPHOS 63  --   BILITOT 1.0  --   PROT 6.4*  --   ALBUMIN 3.3* 3.0*   No results for  input(s): LIPASE, AMYLASE in the last 168 hours. No results for input(s): AMMONIA in the last 168 hours. CBC: Recent Labs  Lab 10/22/18 2349 10/23/18 0000 10/24/18 0451  WBC 7.6  --  7.0  NEUTROABS 4.0  --  4.7  HGB 14.9 15.0 14.7  HCT 43.4 44.0 44.0  MCV 89.7  --  90.5  PLT 175  --  172   Cardiac Enzymes: No results for input(s): CKTOTAL, CKMB, CKMBINDEX, TROPONINI in the last 168 hours. BNP: BNP (last 3 results) No results for input(s): BNP in the last 8760 hours.  ProBNP (last 3 results) No results for input(s): PROBNP in the last 8760 hours.  CBG: Recent Labs  Lab 10/23/18 1121 10/23/18 1647 10/23/18 2141 10/24/18 0655 10/24/18 1111  GLUCAP 205* 203* 194* 199* 213*       Signed:  Nita Sells MD   [facility] 10/24/2018, 3:58 PM

## 2018-10-24 NOTE — Progress Notes (Signed)
Visit made to patients room to discuss setting him up on CPAP.  He states he does not wear every night at home.  I advised I would be glad to set him up on one of our  CPAPS.  Patient was still undecided advised him if he changed his mind to have RN call RT.
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
## 2018-10-24 NOTE — Progress Notes (Addendum)
Inpatient Rehab Admissions Coordinator:    I have insurance authorization and a bed available for pt to admit to inpatient rehab today. I await final word from MD that pt is medically ready for d/c to CIR.    Addendum: 1630: I have approval from Dr. Verlon Au for pt to admit to CIR today.  I will let pt/family, and RN/CM know.   Shann Medal, PT, DPT Admissions Coordinator [phone] 10/24/18  3:25 PM

## 2018-10-25 ENCOUNTER — Inpatient Hospital Stay (HOSPITAL_COMMUNITY): Payer: Medicare Other | Admitting: Speech Pathology

## 2018-10-25 ENCOUNTER — Inpatient Hospital Stay (HOSPITAL_COMMUNITY): Payer: Medicare Other | Admitting: Occupational Therapy

## 2018-10-25 ENCOUNTER — Inpatient Hospital Stay (HOSPITAL_COMMUNITY): Payer: Medicare Other

## 2018-10-25 DIAGNOSIS — I639 Cerebral infarction, unspecified: Secondary | ICD-10-CM

## 2018-10-25 DIAGNOSIS — E1142 Type 2 diabetes mellitus with diabetic polyneuropathy: Secondary | ICD-10-CM

## 2018-10-25 DIAGNOSIS — I5032 Chronic diastolic (congestive) heart failure: Secondary | ICD-10-CM

## 2018-10-25 LAB — CBC WITH DIFFERENTIAL/PLATELET
Abs Immature Granulocytes: 0.03 10*3/uL (ref 0.00–0.07)
Basophils Absolute: 0 10*3/uL (ref 0.0–0.1)
Basophils Relative: 1 %
Eosinophils Absolute: 0.2 10*3/uL (ref 0.0–0.5)
Eosinophils Relative: 3 %
HCT: 43.9 % (ref 39.0–52.0)
Hemoglobin: 14.8 g/dL (ref 13.0–17.0)
Immature Granulocytes: 0 %
Lymphocytes Relative: 28 %
Lymphs Abs: 2.2 10*3/uL (ref 0.7–4.0)
MCH: 30.6 pg (ref 26.0–34.0)
MCHC: 33.7 g/dL (ref 30.0–36.0)
MCV: 90.7 fL (ref 80.0–100.0)
Monocytes Absolute: 0.7 10*3/uL (ref 0.1–1.0)
Monocytes Relative: 9 %
Neutro Abs: 4.8 10*3/uL (ref 1.7–7.7)
Neutrophils Relative %: 59 %
Platelets: 157 10*3/uL (ref 150–400)
RBC: 4.84 MIL/uL (ref 4.22–5.81)
RDW: 13 % (ref 11.5–15.5)
WBC: 8.1 10*3/uL (ref 4.0–10.5)
nRBC: 0 % (ref 0.0–0.2)

## 2018-10-25 LAB — COMPREHENSIVE METABOLIC PANEL
ALT: 15 U/L (ref 0–44)
AST: 14 U/L — ABNORMAL LOW (ref 15–41)
Albumin: 3 g/dL — ABNORMAL LOW (ref 3.5–5.0)
Alkaline Phosphatase: 62 U/L (ref 38–126)
Anion gap: 11 (ref 5–15)
BUN: 32 mg/dL — ABNORMAL HIGH (ref 8–23)
CO2: 25 mmol/L (ref 22–32)
Calcium: 9 mg/dL (ref 8.9–10.3)
Chloride: 104 mmol/L (ref 98–111)
Creatinine, Ser: 1.99 mg/dL — ABNORMAL HIGH (ref 0.61–1.24)
GFR calc Af Amer: 35 mL/min — ABNORMAL LOW (ref >60)
GFR calc non Af Amer: 30 mL/min — ABNORMAL LOW (ref >60)
Glucose, Bld: 162 mg/dL — ABNORMAL HIGH (ref 70–99)
Potassium: 3.2 mmol/L — ABNORMAL LOW (ref 3.5–5.1)
Sodium: 140 mmol/L (ref 135–145)
Total Bilirubin: 1 mg/dL (ref 0.3–1.2)
Total Protein: 6 g/dL — ABNORMAL LOW (ref 6.5–8.1)

## 2018-10-25 LAB — GLUCOSE, CAPILLARY
Glucose-Capillary: 133 mg/dL — ABNORMAL HIGH (ref 70–99)
Glucose-Capillary: 234 mg/dL — ABNORMAL HIGH (ref 70–99)
Glucose-Capillary: 178 mg/dL — ABNORMAL HIGH (ref 70–99)
Glucose-Capillary: 226 mg/dL — ABNORMAL HIGH (ref 70–99)

## 2018-10-25 LAB — PROTIME-INR
INR: 2.6 — ABNORMAL HIGH (ref 0.8–1.2)
Prothrombin Time: 27.1 seconds — ABNORMAL HIGH (ref 11.4–15.2)

## 2018-10-25 MED ORDER — POTASSIUM CHLORIDE CRYS ER 20 MEQ PO TBCR
20.0000 meq | EXTENDED_RELEASE_TABLET | Freq: Two times a day (BID) | ORAL | Status: DC
  Administered 2018-10-25 – 2018-11-03 (×19): 20 meq via ORAL
  Filled 2018-10-25 (×19): qty 1

## 2018-10-25 MED ORDER — WARFARIN SODIUM 5 MG PO TABS
5.0000 mg | ORAL_TABLET | Freq: Once | ORAL | Status: AC
  Administered 2018-10-25: 17:00:00 5 mg via ORAL
  Filled 2018-10-25: qty 1
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
## 2018-10-25 NOTE — H&P (Signed)
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 orbits. Paranasal sinuses and mastoid air cells are clear. Other: None. MRA HEAD FINDINGS The visualized distal vertebral arteries are widely patent to the basilar with the left being mildly dominant. Patent PICA and SCA origins are identified bilaterally. The basilar artery is widely patent. Posterior communicating arteries are not well seen and may be diminutive or absent. PCAs are patent without evidence of significant proximal stenosis. The internal carotid arteries are widely patent from skull base to carotid termini. ACAs and MCAs are patent without evidence of proximal branch occlusion or significant proximal stenosis. No  aneurysm is identified. IMPRESSION: 1. Small acute posterior right frontal infarct. 2. Mild chronic small vessel ischemic disease. 3. Negative head MRA. Electronically Signed   By: Logan Bores M.D.   On: 10/23/2018 09:36    Mr Brain Wo Contrast   Result Date: 10/23/2018 CLINICAL DATA:  Acute left-sided weakness. EXAM: MRI HEAD WITHOUT CONTRAST MRA HEAD WITHOUT CONTRAST TECHNIQUE: Multiplanar, multiecho pulse sequences of the brain and surrounding structures were obtained without intravenous contrast. Angiographic images of the head were obtained using MRA technique without contrast. COMPARISON:  Head CT 10/23/2018 and MRI 09/01/2018 FINDINGS: MRI HEAD FINDINGS The study is mildly motion degraded. Brain: There is a small acute cortical and subcortical infarction in the posterior right frontal lobe which involves the precentral gyrus. Scattered cerebral white matter T2 hyperintensities are similar to the prior MRI and nonspecific but compatible with mild chronic small vessel ischemic disease. A chronic lacunar infarct is again noted in the right thalamus. Cerebral atrophy is not greater than expected for age. Vascular: Major intracranial vascular flow voids are preserved. Skull and upper cervical spine: Unremarkable bone marrow signal. Sinuses/Orbits: Unremarkable orbits. Paranasal sinuses and mastoid air cells are clear. Other: None. MRA HEAD FINDINGS The visualized distal vertebral arteries are widely patent to the basilar with the left being mildly dominant. Patent PICA and SCA origins are identified bilaterally. The basilar artery is widely patent. Posterior communicating arteries are not well seen and may be diminutive or absent. PCAs are patent without evidence of significant proximal stenosis. The internal carotid arteries are widely patent from skull base to carotid termini. ACAs and MCAs are patent without evidence of proximal branch occlusion or significant proximal stenosis. No aneurysm is identified.  IMPRESSION: 1. Small acute posterior right frontal infarct. 2. Mild chronic small vessel ischemic disease. 3. Negative head MRA. Electronically Signed   By: Logan Bores M.D.   On: 10/23/2018 09:36    Vas US Carotid   Result Date: 10/23/2018 Carotid Arterial Duplex Study Indications:       CVA, Weakness and Gait disturbance. Risk Factors:      Hypertension, hyperlipidemia, Diabetes, past history  of                    smoking. Other Factors:     Atrial fibrillation, on Coumadin. Recent diagnosis of                    dementia with behavior disturbance. Comparison Study:  No prior study on file for comparison. Performing Technologist: Sharion Dove RVS  Examination Guidelines: A complete evaluation includes B-mode imaging, spectral Doppler, color Doppler, and power Doppler as needed of all accessible portions of each vessel. Bilateral testing is considered an integral part of a complete examination. Limited examinations for reoccurring indications may be performed as noted.  Right Carotid Findings: +----------+--------+--------+--------+------------+---------+           PSV cm/sEDV cm/sStenosisDescribe    Comments  +----------+--------+--------+--------+------------+---------+ CCA Prox  96      15                                    +----------+--------+--------+--------+------------+---------+ CCA Distal81      15              heterogenous          +----------+--------+--------+--------+------------+---------+ ICA Prox  72      16              calcific    Shadowing +----------+--------+--------+--------+------------+---------+ ICA Distal64      10                                    +----------+--------+--------+--------+------------+---------+ ECA       115     14                                    +----------+--------+--------+--------+------------+---------+ +----------+--------+-------+--------+-------------------+           PSV cm/sEDV cmsDescribeArm Pressure  (mmHG) +----------+--------+-------+--------+-------------------+ Subclavian116                                        +----------+--------+-------+--------+-------------------+ +---------+--------+--+--------+-+ VertebralPSV cm/s27EDV cm/s8 +---------+--------+--+--------+-+  Left Carotid Findings: +----------+--------+--------+--------+------------+------------------+           PSV cm/sEDV cm/sStenosisDescribe    Comments           +----------+--------+--------+--------+------------+------------------+ CCA Prox  101     18                          intimal thickening +----------+--------+--------+--------+------------+------------------+ CCA Distal71      12              homogeneous                    +----------+--------+--------+--------+------------+------------------+ ICA Prox  109     13              heterogenous                   +----------+--------+--------+--------+------------+------------------+ ICA Distal81      24                                             +----------+--------+--------+--------+------------+------------------+  ECA       141     22                                             +----------+--------+--------+--------+------------+------------------+ +----------+--------+--------+--------+-------------------+ SubclavianPSV cm/sEDV cm/sDescribeArm Pressure (mmHG) +----------+--------+--------+--------+-------------------+           71                                          +----------+--------+--------+--------+-------------------+ +---------+--------+--+--------+-+ VertebralPSV cm/s56EDV cm/s4 +---------+--------+--+--------+-+  Summary: Right Carotid: Velocities in the right ICA are consistent with a 1-39% stenosis. Left Carotid: Velocities in the left ICA are consistent with a 1-39% stenosis. Vertebrals:  Bilateral vertebral arteries demonstrate antegrade flow. Subclavians: Normal flow hemodynamics were seen in  bilateral subclavian              arteries. *See table(s) above for measurements and observations.     Preliminary              Medical Problem List and Plan: 1.  Left-sided weakness secondary to small acute posterior right frontal infarction             -admit to inpatient rehab 2.  Antithrombotics: -DVT/anticoagulation: Chronic Coumadin             -antiplatelet therapy: N/A 3. Pain Management: Tylenol as needed 4. Mood: Recent diagnosis of dementia             -antipsychotic agents: N/A 5. Neuropsych: This patient is capable of making decisions on his own behalf. 6. Skin/Wound Care: Routine skin checks 7. Fluids/Electrolytes/Nutrition: Routine in and outs with follow-up chemistries 8.  Atrial fibrillation.  Cardiac rate controlled.  Continue Coumadin. 9.  Diabetes mellitus with peripheral neuropathy.  Lantus insulin 30 units nightly.  Check blood sugars before meals and at bedtime 10.  Chronic diastolic congestive heart failure.  Monitor for any signs of fluid overload.  Lasix 80 mg daily. 11.  Hyperlipidemia.  Lipitor 12.  History of tobacco use.  Provide counseling 13.  OSA.  Continue CPAP 14.  Chronic renal insufficiency with baseline creatinine 1.83-1.97. 15.  Question alcohol use.  Monitor for any signs of withdrawal and provide counseling    Post Admission Physician Evaluation: 1. Functional deficits secondary  to right frontal infarct. 2. Patient is admitted to receive collaborative, interdisciplinary care between the physiatrist, rehab nursing staff, and therapy team. 3. Patient's level of medical complexity and substantial therapy needs in context of that medical necessity cannot be provided at a lesser intensity of care such as a SNF. 4. Patient has experienced substantial functional loss from his/her baseline which was documented above under the "Functional History" and "Functional Status" headings.  Judging by the patient's diagnosis, physical exam, and functional  history, the patient has potential for functional progress which will result in measurable gains while on inpatient rehab.  These gains will be of substantial and practical use upon discharge  in facilitating mobility and self-care at the household level. 5. Physiatrist will provide 24 hour management of medical needs as well as oversight of the therapy plan/treatment and provide guidance as appropriate regarding the interaction of the two. 6. The Preadmission Screening has been reviewed and patient status is unchanged unless otherwise stated above. 7. 24 hour rehab nursing  will assist with bladder management, bowel management, safety, skin/wound care, disease management, medication administration, pain management and patient education  and help integrate therapy concepts, techniques,education, etc. 8. PT will assess and treat for/with: Lower extremity strength, range of motion, stamina, balance, functional mobility, safety, adaptive techniques and equipment, NMR.   Goals are: supervision. 9. OT will assess and treat for/with: ADL's, functional mobility, safety, upper extremity strength, adaptive techniques and equipment, NMR.   Goals are: supervision. Therapy may proceed with showering this patient. 10. SLP will assess and treat for/with: cognition, communication, family ed.  Goals are: supervision. 11. Case Management and Social Worker will assess and treat for psychological issues and discharge planning. 12. Team conference will be held weekly to assess progress toward goals and to determine barriers to discharge. 13. Patient will receive at least 3 hours of therapy per day at least 5 days per week. 14. ELOS: 10-12 days       15. Prognosis:  excellent   I have personally performed a face to face diagnostic evaluation of this patient and formulated the key components of the plan.  Additionally, I have personally reviewed laboratory data, imaging studies, as well as relevant notes and concur with the  physician assistant's documentation above.  Meredith Staggers, MD, FAAPMR     Lavon Paganini [place], PA-C 10/24/2018

## 2018-10-25 NOTE — Patient Care Conference (Signed)
Inpatient RehabilitationTeam Conference and Plan of Care Update Date: 10/25/2018   Time: 10:55 AM    Patient Name: Dennis Williams      Medical Record Number: [id number]  Date of Birth: 02-27-1934 Sex: Male         Room/Bed: 4W25C/4W25C-01 Payor Info: Payor: [place] / Plan: [employer] MEDICARE / Product Type: *No Product type* /    Admitting Diagnosis: CVA 1 Team  Rt. CVA  Admit Date/Time:  10/24/2018  6:45 PM Admission Comments: No comment available   Primary Diagnosis:  <principal problem not specified> Principal Problem: <principal problem not specified>  Patient Active Problem List   Diagnosis Date Noted  . Ischemic cerebrovascular accident (CVA) of frontal lobe ([place]) 10/24/2018  . Stroke-like symptoms 10/23/2018  . CKD (chronic kidney disease), stage III ([place]) 10/23/2018  . Hypokalemia 10/23/2018  . CVA (cerebral vascular accident) ([place]) 10/23/2018  . Insulin dependent diabetes mellitus ([place]) 11/21/2016  . Cellulitis 11/21/2016  . Acute respiratory failure with hypoxia ([place]) 11/21/2016  . Sleep apnea 11/21/2016  . Constipation 11/21/2016  . Renal insufficiency   . 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 Prostate cancer ([place]) 05/11/2011  . Claudication ([place]) 08/31/2010  . Atrial fibrillation ([place])   . Hypertension   . Chronic diastolic CHF (congestive heart failure) [facility])     Expected Discharge Date: Expected Discharge Date: (7 to 10 days)  Team Members Present: Physician leading conference: Dr. Alysia Penna Social Worker Present: Alfonse Alpers, LCSW Nurse Present: Rayetta Pigg, RN PT Present: Phylliss Bob, PTA;Leavy Cella, PT OT Present: Darleen Crocker, OT SLP Present: Other (comment)(Celia Ali [place], SLP) PPS Coordinator present : Ileana Ladd, PT     Current Status/Progress Goal Weekly Team Focus  Medical   Left hemiparesis, BPs controlled   maintain medical stability  initial evals    Bowel/Bladder   LBM 5/26 Cont x2 w/episodes of  incontinence  timed toileting while awake. fewer episodes of incontinence   Assess toileting needs qshift and PRN   Swallow/Nutrition/ Hydration             ADL's   eval pending         Mobility   eval pending         Communication   eval pending         Safety/Cognition/ Behavioral Observations  eval pending         Pain   no c/o pain  remain pain free  assess pain qshift and PRN   Skin              Rehab Goals Patient on target to meet rehab goals: Yes Rehab Goals Revised: none, as this is pt's first conference *See Care Plan and progress notes for long and short-term goals.     Barriers to Discharge  Current Status/Progress Possible Resolutions Date Resolved   Physician    Medical stability     initiating therapy  COnt rehab      Nursing                  PT                    OT                  SLP                SW                Discharge Planning/Teaching  Needs:  Pt plans to return to his home where his wife will assist him.  Wife can come in for family education closer to d/c.   Team Discussion:  Pt with CHF and dementia who had a right frontal infarct with left field cut and mild hemiparesis.  Therapy evaluations are ongoing now.  Revisions to Treatment Plan:  none    Continued Need for Acute Rehabilitation Level of Care: The patient requires daily medical management by a physician with specialized training in physical medicine and rehabilitation for the following conditions: Daily direction of a multidisciplinary physical rehabilitation program to ensure safe treatment while eliciting the highest outcome that is of practical value to the patient.: Yes Daily medical management of patient stability for increased activity during participation in an intensive rehabilitation regime.: Yes Daily analysis of laboratory values and/or radiology reports with any subsequent need for medication adjustment of medical intervention for : Neurological problems;Blood  pressure problems   I attest that I was present, lead the team conference, and concur with the assessment and plan of the team.Team conference was held via web/ teleconference due to [place] - 19.   Elsi Stelzer, Silvestre Mesi 10/25/2018, 1:52 PM
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
## 2018-10-25 NOTE — Progress Notes (Signed)
[place] PHYSICAL MEDICINE & REHABILITATION PROGRESS NOTE   Subjective/Complaints:  Reviewed carotid artery Dopplers, no significant stenosis noted in the carotid or vertebral artery system. CBC is normal Bmet showing hypokalemia elevated BUN/creatinine  Patient has completed his first therapy session, he did car transfers he did chair to bed transfers.  Had some struggles with car transfers but transfers bed to chair with min assist Review of systems denies chest pain shortness of breath nausea vomiting diarrhea or constipation  Objective:   Vas US Carotid  Result Date: 10/24/2018 Carotid Arterial Duplex Study Indications:       CVA, Weakness and Gait disturbance. Risk Factors:      Hypertension, hyperlipidemia, Diabetes, past history of                    smoking. Other Factors:     Atrial fibrillation, on Coumadin. Recent diagnosis of                    dementia with behavior disturbance. Comparison Study:  No prior study on file for comparison. Performing Technologist: Sharion Dove RVS  Examination Guidelines: A complete evaluation includes B-mode imaging, spectral Doppler, color Doppler, and power Doppler as needed of all accessible portions of each vessel. Bilateral testing is considered an integral part of a complete examination. Limited examinations for reoccurring indications may be performed as noted.  Right Carotid Findings: +----------+--------+--------+--------+------------+---------+           PSV cm/sEDV cm/sStenosisDescribe    Comments  +----------+--------+--------+--------+------------+---------+ CCA Prox  96      15                                    +----------+--------+--------+--------+------------+---------+ CCA Distal81      15              heterogenous          +----------+--------+--------+--------+------------+---------+ ICA Prox  72      16              calcific    Shadowing +----------+--------+--------+--------+------------+---------+  ICA Distal64      10                                    +----------+--------+--------+--------+------------+---------+ ECA       115     14                                    +----------+--------+--------+--------+------------+---------+ +----------+--------+-------+--------+-------------------+           PSV cm/sEDV cmsDescribeArm Pressure (mmHG) +----------+--------+-------+--------+-------------------+ Subclavian116                                        +----------+--------+-------+--------+-------------------+ +---------+--------+--+--------+-+ VertebralPSV cm/s27EDV cm/s8 +---------+--------+--+--------+-+  Left Carotid Findings: +----------+--------+--------+--------+------------+------------------+           PSV cm/sEDV cm/sStenosisDescribe    Comments           +----------+--------+--------+--------+------------+------------------+ CCA Prox  101     18                          intimal thickening +----------+--------+--------+--------+------------+------------------+ CCA Distal71  12              homogeneous                    +----------+--------+--------+--------+------------+------------------+ ICA Prox  109     13              heterogenous                   +----------+--------+--------+--------+------------+------------------+ ICA Distal81      24                                             +----------+--------+--------+--------+------------+------------------+ ECA       141     22                                             +----------+--------+--------+--------+------------+------------------+ +----------+--------+--------+--------+-------------------+ SubclavianPSV cm/sEDV cm/sDescribeArm Pressure (mmHG) +----------+--------+--------+--------+-------------------+           71                                          +----------+--------+--------+--------+-------------------+ +---------+--------+--+--------+-+  VertebralPSV cm/s56EDV cm/s4 +---------+--------+--+--------+-+  Summary: Right Carotid: Velocities in the right ICA are consistent with a 1-39% stenosis. Left Carotid: Velocities in the left ICA are consistent with a 1-39% stenosis. Vertebrals:  Bilateral vertebral arteries demonstrate antegrade flow. Subclavians: Normal flow hemodynamics were seen in bilateral subclavian              arteries. *See table(s) above for measurements and observations.  Electronically signed by Antony Contras MD on 10/24/2018 at 12:46:39 PM.    Final    Recent Labs    10/24/18 0451 10/25/18 0520  WBC 7.0 8.1  HGB 14.7 14.8  HCT 44.0 43.9  PLT 172 157   Recent Labs    10/24/18 0451 10/25/18 0520  NA 139 140  K 3.4* 3.2*  CL 105 104  CO2 26 25  GLUCOSE 217* 162*  BUN 34* 32*  CREATININE 1.83* 1.99*  CALCIUM 9.0 9.0    Intake/Output Summary (Last 24 hours) at 10/25/2018 0934 Last data filed at 10/25/2018 0440 Gross per 24 hour  Intake 220 ml  Output 1200 ml  Net -980 ml     Physical Exam: Vital Signs Blood pressure 139/74, pulse 69, temperature 98 F (36.7 C), temperature source Oral, resp. rate 16, height 5\' 8"  (1.727 m), weight 109.2 kg, SpO2 99 %.   General: No acute distress Mood and affect are appropriate Heart: Regular rate and rhythm no rubs murmurs or extra sounds Lungs: Clear to auscultation, breathing unlabored, no rales or wheezes Abdomen: Positive bowel sounds, soft nontender to palpation, nondistended Extremities: No clubbing, cyanosis, or edema Skin: No evidence of breakdown, chronic stasis dermatitis changes bilateral pretibial area Neurologic: Cranial nerves II through XII intact, motor strength is 5/5 in right and 4/5 left deltoid, bicep, tricep, grip, hip flexor, knee extensors, ankle dorsiflexor and plantar flexor Sensory exam normal sensation to light touch  bilateral upper and lower extremities Positive dysdiadochokinesis with rapid alternating supination pronation of  the left upper extremity compared to the right side Visual fields are intact confrontation testing  Musculoskeletal: Full range of motion in all 4 extremities. No joint swelling   Assessment/Plan: 1. Functional deficits secondary to right frontal infarct with left hemiparesis which require 3+ hours per day of interdisciplinary therapy in a comprehensive inpatient rehab setting.  Physiatrist is providing close team supervision and 24 hour management of active medical problems listed below.  Physiatrist and rehab team continue to assess barriers to discharge/monitor patient progress toward functional and medical goals  Care Tool:  Bathing              Bathing assist       Upper Body Dressing/Undressing Upper body dressing   What is the patient wearing?: Button up shirt    Upper body assist Assist Level: Minimal Assistance - Patient > 75%    Lower Body Dressing/Undressing Lower body dressing    Lower body dressing activity did not occur: N/A What is the patient wearing?: Incontinence brief, Pants     Lower body assist Assist for lower body dressing: Moderate Assistance - Patient 50 - 74%     Toileting Toileting    Toileting assist Assist for toileting: Moderate Assistance - Patient 50 - 74%     Transfers Chair/bed transfer  Transfers assist  Chair/bed transfer activity did not occur: N/A  Chair/bed transfer assist level: Moderate Assistance - Patient 50 - 74%     Locomotion Ambulation   Ambulation assist              Walk 10 feet activity   Assist           Walk 50 feet activity   Assist           Walk 150 feet activity   Assist           Walk 10 feet on uneven surface  activity   Assist           Wheelchair     Assist               Wheelchair 50 feet with 2 turns activity    Assist            Wheelchair 150 feet activity     Assist          Medical Problem List and  Plan: 1.Left-sided weaknesssecondary to small acute posterior right frontal infarction rehab evals today, team conference 2. Antithrombotics: -DVT/anticoagulation:Chronic Coumadin -antiplatelet therapy: N/A 3. Pain Management:Tylenol as needed 4. Mood:Recent diagnosis of dementia -antipsychotic agents: N/A 5. Neuropsych: This patientiscapable of making decisions on hisown behalf. 6. Skin/Wound Care:Routine skin checks 7. Fluids/Electrolytes/Nutrition:Routine in and outs with follow-up chemistries 8.Atrial fibrillation. Cardiac rate controlled. Continue Coumadin. 9.Diabetes mellitus with peripheral neuropathy. Lantus insulin 30 units nightly. Check blood sugars before meals and at bedtime CBG (last 3)  Recent Labs    10/24/18 1606 10/24/18 2112 10/25/18 0608  GLUCAP 209* 163* 133*  lability cont to monitor prior to dose changes  10.Chronic diastolic congestive heart failure. Monitor for any signs of fluid overload. Lasix 80 mg daily.  Currently without lower extremity edema 11. Hyperlipidemia. Lipitor 12. History of tobacco use. Provide counseling 13. OSA. Continue CPAP 14.Chronic renal insufficiency with baseline creatinine 1.83-1.97. 15. Question alcohol use. Monitor for any signs of withdrawal and provide counseling 16.  Hypokalemia likely due to Lasix will supplement KCl  LOS: 1 days A FACE TO FACE EVALUATION WAS PERFORMED  Charlett Blake 10/25/2018, 9:34 AM

## 2018-10-26 ENCOUNTER — Inpatient Hospital Stay (HOSPITAL_COMMUNITY): Payer: Medicare Other | Admitting: Occupational Therapy

## 2018-10-26 ENCOUNTER — Inpatient Hospital Stay (HOSPITAL_COMMUNITY): Payer: Medicare Other | Admitting: Physical Therapy

## 2018-10-26 ENCOUNTER — Inpatient Hospital Stay (HOSPITAL_COMMUNITY): Payer: Medicare Other | Admitting: Speech Pathology

## 2018-10-26 DIAGNOSIS — I69398 Other sequelae of cerebral infarction: Secondary | ICD-10-CM

## 2018-10-26 DIAGNOSIS — R269 Unspecified abnormalities of gait and mobility: Secondary | ICD-10-CM

## 2018-10-26 DIAGNOSIS — N184 Chronic kidney disease, stage 4 (severe): Secondary | ICD-10-CM

## 2018-10-26 LAB — PROTIME-INR
INR: 3.1 — ABNORMAL HIGH (ref 0.8–1.2)
Prothrombin Time: 31.2 seconds — ABNORMAL HIGH (ref 11.4–15.2)

## 2018-10-26 LAB — GLUCOSE, CAPILLARY
Glucose-Capillary: 155 mg/dL — ABNORMAL HIGH (ref 70–99)
Glucose-Capillary: 158 mg/dL — ABNORMAL HIGH (ref 70–99)
Glucose-Capillary: 167 mg/dL — ABNORMAL HIGH (ref 70–99)
Glucose-Capillary: 183 mg/dL — ABNORMAL HIGH (ref 70–99)

## 2018-10-26 LAB — HEMOGLOBIN A1C
Hgb A1c MFr Bld: 8.8 % — ABNORMAL HIGH (ref 4.8–5.6)
Mean Plasma Glucose: 206 mg/dL

## 2018-10-26 MED ORDER — WARFARIN SODIUM 2.5 MG PO TABS
2.5000 mg | ORAL_TABLET | Freq: Once | ORAL | Status: AC
  Administered 2018-10-26: 2.5 mg via ORAL
  Filled 2018-10-26: qty 1

## 2018-10-26 NOTE — IPOC Note (Addendum)
Overall Plan of Care [facility]) Patient Details Name: Dennis Williams MRN: [id number] DOB: Jul 27, 1933  Admitting Diagnosis: <principal problem not specified>  Hospital Problems: Active Problems:   Ischemic cerebrovascular accident (CVA) of frontal lobe ([place])     Functional Problem List: Nursing Bladder, Bowel, Endurance, Medication Management, Safety  PT Safety, Balance, Behavior, Edema, Skin Integrity, Endurance, Motor, Pain  OT Balance, Cognition, Endurance, Safety  SLP Cognition, Perception, Safety  TR         Basic ADL's: OT Grooming, Bathing, Toileting, Dressing     Advanced  ADL's: OT       Transfers: PT Bed Mobility, Bed to Chair, Car, Furniture, Floor  OT Tub/Shower, Agricultural engineer: PT Ambulation, Emergency planning/management officer, Stairs     Additional Impairments: OT None  SLP Social Cognition   Social Interaction, Problem Solving, Attention, Memory  TR      Anticipated Outcomes Item Anticipated Outcome  Self Feeding independent  Swallowing      Basic self-care  supervision   Toileting  supervision   Bathroom Transfers supervision  Bowel/Bladder  cont x 2, maintain normal bowel function  Transfers  supervision  Locomotion  supervison 100' wit LRAD  Communication     Cognition  supervision  Pain  less than 3  Safety/Judgment  free of falls or injury   Therapy Plan: PT Intensity: Minimum of 1-2 x/day ,45 to 90 minutes PT Frequency: 5 out of 7 days PT Duration Estimated Length of Stay: 7-10 days OT Intensity: Minimum of 1-2 x/day, 45 to 90 minutes OT Frequency: 5 out of 7 days OT Duration/Estimated Length of Stay: 7-9 days SLP Intensity: (30 minutes/day) SLP Frequency: 3 to 5 out of 7 days SLP Duration/Estimated Length of Stay: 7-10 days   Due to the current state of emergency, patients may not be receiving their 3-hours of Medicare-mandated therapy.   Team Interventions: Nursing Interventions Bowel Management, Medication Management,  Patient/Family Education, Bladder Management  PT interventions Ambulation/gait training, Community reintegration, DME/adaptive equipment instruction, Neuromuscular re-education, Psychosocial support, Stair training, UE/LE Strength taining/ROM, Wheelchair propulsion/positioning, Training and development officer, Discharge planning, Functional electrical stimulation, Pain management, Skin care/wound management, Therapeutic Activities, UE/LE Coordination activities, Cognitive remediation/compensation, Disease management/prevention, Functional mobility training, Patient/family education, Splinting/orthotics, Therapeutic Exercise  OT Interventions Balance/vestibular training, Discharge planning, Neuromuscular re-education, Patient/family education, Self Care/advanced ADL retraining, Therapeutic Exercise, UE/LE Coordination activities, DME/adaptive equipment instruction, Therapeutic Activities, UE/LE Strength taining/ROM, Pain management, Functional mobility training, Cognitive remediation/compensation, Disease mangement/prevention  SLP Interventions Cognitive remediation/compensation, Multimodal communication approach, Functional tasks, Patient/family education  TR Interventions    SW/CM Interventions Discharge Planning, Psychosocial Support, Patient/Family Education   Barriers to Discharge MD  Medical stability  Nursing      PT      OT      SLP      SW       Team Discharge Planning: Destination: PT-Home ,OT- Home , SLP-Home Projected Follow-up: PT-Outpatient PT, OT-  Home health OT, SLP-(TBD) Projected Equipment Needs: PT- , OT- To be determined, SLP-None recommended by SLP Equipment Details: PT-Patient reports he has a RW, other DME TBD, OT-  Patient/family involved in discharge planning: PT- Patient,  OT-Patient, SLP-Patient  MD ELOS: 8-12d Medical Rehab Prognosis:  Good Assessment:  85 year old right-handed male with history of atrial fibrillation maintained on chronic Coumadin, OSA with CPAP,  chronic diastolic congestive heart failure, hypertension, tobacco use, left TKA June 2018, renal insufficiency with baseline creatinine 1.83-1.97, insulin-dependent diabetes mellitus and recent diagnosis of dementia with behavioral  disturbance. Per chart review patient lives with spouse. Independent with assistive device using a cane. One level home. Presented 10/23/2018 with left side weakness. INR on admission 2.1, urine study negative. COVID negative. Cranial CT scan negative. Patient did not receive TPA. MRI/MRA showed small acute posterior right frontal infarction. Mild chronic small vessel ischemic disease. MRA negative. EEG negative. Carotid Dopplers with no ICA stenosis. Echocardiogram pending. Neurology follow-up patient currently remains on chronic Coumadin therapy as prior to admission.  Now requiring 24/7 Rehab RN,MD, as well as CIR level PT, OT and SLP.  Treatment team will focus on ADLs and mobility with goals set at Supervision See Team Conference Notes for weekly updates to the plan of care

## 2018-10-26 NOTE — Progress Notes (Signed)
Physical Therapy Session Note  Patient Details  Name: Dennis Williams MRN: [id number] Date of Birth: Aug 09, 1933  Today's Date: 10/26/2018 PT Individual Time: 0800-0900 PT Individual Time Calculation (min): 60 min   Short Term Goals: Week 1:  PT Short Term Goal 1 (Week 1): STG=LTG due to ELOS.  Skilled Therapeutic Interventions/Progress Updates:   Pt received supine in bed and agreeable to PT. Supine>sit transfer with supervision assist from slight incline and heavy use of rail. Pt reports need for urination. Ambulatory transfer to toilet with CGA-min assist from PT for safety and AD management. PT required to aid in lower body clothing management. Dressing at toilet with CGA for sit<>stand using rail and clothing management to bring pants around feet and to knees.   Pt transported to rehab gym in [facility]. BP assessed 122/86 sitting and 142/88 in standing; pt asymptomatic. Gait training with RW x 24f with CGA from PT for safety and min cues for AD management in turns. [facility]<>[facility] throughout treatment.  Attempted ascent of 6 inch step with BUE support on Rails and RLE; pt unable to generate enough force to complete step with as much as mod assist from PT.   Patient returned to room and performed stand pivot transfer to recliner with CGA from PT for safety.Pt and left sitting in recliner with call bell in reach and all needs met.         Therapy Documentation Precautions:  Precautions Precautions: Fall Restrictions Weight Bearing Restrictions: No pain: denies   Therapy/Group: Individual Therapy  [name]5/28/2020, 9:08 AM

## 2018-10-26 NOTE — Progress Notes (Signed)
Speech Language Pathology Daily Session Note  Patient Details  Name: Dennis Williams MRN: [id number] Date of Birth: 1933/07/21  Today's Date: 10/26/2018 SLP Individual Time: 0630-1601 SLP Individual Time Calculation (min): 40 min  Short Term Goals: Week 1: SLP Short Term Goal 1 (Week 1): Pt will recall basic daily information with Mod A verbal cues for use of external aids. SLP Short Term Goal 2 (Week 1): Pt will consistently demonstrate Ox4 with min A verbal cues SLP Short Term Goal 3 (Week 1): Pt will complete basic functional problem solving/thought organization/reasoning for basic and familiar tasks with Mod A  Skilled Therapeutic Interventions: Skilled treatment session focused on cognitive goals. Upon arrival, patient was awake in the recliner. SLP facilitated session by providing Min A verbal cues for orientation to month and day and total A for orientation to date. Therefore, SLP created a visual aid to maximize recall of current diet in which he was able to utilize with Min verbal cues. SLP also facilitated session by providing Mod A verbal and visual cues for problem solving and Max A verbal cues for sustained attention during a basic money management task. Throughout session, patient would intermittently moan/groan aloud but denied pain with head nods and was very focused on quickly rubbing his hands together. When eventually pressed for information, patient reported, "I don't know, honey." RN aware. Patient left upright in recliner with alarm on and all needs within reach. Continue with current plan of care.      Pain No/Denies Pain   Therapy/Group: Individual Therapy  Haizel Gatchell 10/26/2018, 2:47 PM

## 2018-10-26 NOTE — Progress Notes (Signed)
[place] PHYSICAL MEDICINE & REHABILITATION PROGRESS NOTE   Subjective/Complaints:  Did well with PT, "slow but steady"  Review of systems denies chest pain shortness of breath nausea vomiting diarrhea or constipation  Objective:   No results found. Recent Labs    10/24/18 0451 10/25/18 0520  WBC 7.0 8.1  HGB 14.7 14.8  HCT 44.0 43.9  PLT 172 157   Recent Labs    10/24/18 0451 10/25/18 0520  NA 139 140  K 3.4* 3.2*  CL 105 104  CO2 26 25  GLUCOSE 217* 162*  BUN 34* 32*  CREATININE 1.83* 1.99*  CALCIUM 9.0 9.0    Intake/Output Summary (Last 24 hours) at 10/26/2018 0844 Last data filed at 10/26/2018 0659 Gross per 24 hour  Intake 480 ml  Output 1025 ml  Net -545 ml     Physical Exam: Vital Signs Blood pressure 139/84, pulse 79, temperature 98.1 F (36.7 C), temperature source Oral, resp. rate 16, height 5\' 8"  (1.727 m), weight 108.6 kg, SpO2 99 %.   General: No acute distress Mood and affect are appropriate Heart: Regular rate and rhythm no rubs murmurs or extra sounds Lungs: Clear to auscultation, breathing unlabored, no rales or wheezes Abdomen: Positive bowel sounds, soft nontender to palpation, nondistended Extremities: No clubbing, cyanosis, or edema Skin: No evidence of breakdown, chronic stasis dermatitis changes bilateral pretibial area Neurologic: Cranial nerves II through XII intact, motor strength is 5/5 in right and 4/5 left deltoid, bicep, tricep, grip, hip flexor, knee extensors, ankle dorsiflexor and plantar flexor Sensory exam normal sensation to light touch  bilateral upper and lower extremities Positive dysdiadochokinesis with rapid alternating supination pronation of the left upper extremity compared to the right side Visual fields are intact confrontation testing Musculoskeletal: Full range of motion in all 4 extremities. No joint swelling   Assessment/Plan: 1. Functional deficits secondary to right frontal infarct with left  hemiparesis which require 3+ hours per day of interdisciplinary therapy in a comprehensive inpatient rehab setting.  Physiatrist is providing close team supervision and 24 hour management of active medical problems listed below.  Physiatrist and rehab team continue to assess barriers to discharge/monitor patient progress toward functional and medical goals  Care Tool:  Bathing    Body parts bathed by patient: Right arm, Left arm, Chest, Face, Right lower leg, Left lower leg, Left upper leg, Right upper leg, Abdomen     Body parts n/a: Front perineal area, Buttocks(pt completed earlier with nursing per her report)   Bathing assist Assist Level: Moderate Assistance - Patient 50 - 74%     Upper Body Dressing/Undressing Upper body dressing   What is the patient wearing?: Button up shirt    Upper body assist Assist Level: Minimal Assistance - Patient > 75%    Lower Body Dressing/Undressing Lower body dressing    Lower body dressing activity did not occur: N/A What is the patient wearing?: Incontinence brief, Pants     Lower body assist Assist for lower body dressing: Moderate Assistance - Patient 50 - 74%     Toileting Toileting    Toileting assist Assist for toileting: Moderate Assistance - Patient 50 - 74%     Transfers Chair/bed transfer  Transfers assist  Chair/bed transfer activity did not occur: N/A  Chair/bed transfer assist level: Moderate Assistance - Patient 50 - 74%     Locomotion Ambulation   Ambulation assist      Assist level: Moderate Assistance - Patient 50 - 74% Assistive device: Walker-rolling  Max distance: 10'   Walk 10 feet activity   Assist     Assist level: Contact Guard/Touching assist Assistive device: Walker-rolling   Walk 50 feet activity   Assist Walk 50 feet with 2 turns activity did not occur: Safety/medical concerns(decreased strength/activity tolerance)         Walk 150 feet activity   Assist Walk 150 feet  activity did not occur: Safety/medical concerns(decreased strength/activity tolerance)         Walk 10 feet on uneven surface  activity   Assist           Wheelchair     Assist Will patient use wheelchair at discharge?: Yes Type of Wheelchair: Manual    Wheelchair assist level: Supervision/Verbal cueing, Set up assist Max wheelchair distance: 64'    Wheelchair 50 feet with 2 turns activity    Assist        Assist Level: Set up assist, Supervision/Verbal cueing   Wheelchair 150 feet activity     Assist Wheelchair 150 feet activity did not occur: Safety/medical concerns(decreased strength/activity tolerance)        Medical Problem List and Plan: 1.Left-sided weaknesssecondary to small acute posterior right frontal infarction CIR PT,  OT, SLP 2. Antithrombotics: -DVT/anticoagulation:Chronic Coumadin -antiplatelet therapy: N/A 3. Pain Management:Tylenol as needed 4. Mood:Recent diagnosis of dementia -antipsychotic agents: N/A 5. Neuropsych: This patientiscapable of making decisions on hisown behalf. 6. Skin/Wound Care:Routine skin checks 7. Fluids/Electrolytes/Nutrition:Routine in and outs with follow-up chemistries 8.Atrial fibrillation. Cardiac rate controlled. Continue Coumadin. 9.Diabetes mellitus with peripheral neuropathy. Lantus insulin 30 units nightly. Check blood sugars before meals and at bedtime CBG (last 3)  Recent Labs    10/25/18 1640 10/25/18 2105 10/26/18 0638  GLUCAP 178* 226* 155*  will increase Lantus to 33U 10.Chronic diastolic congestive heart failure. Monitor for any signs of fluid overload. Lasix 80 mg daily.  Currently without lower extremity edema Filed Weights   10/24/18 1842 10/25/18 0440 10/26/18 0333  Weight: 109.1 kg 109.2 kg 108.6 kg  no evidence of wt gain  11. Hyperlipidemia. Lipitor 12. History of tobacco use. Provide counseling 13. OSA. Continue  CPAP 14.Chronic renal insufficiency with baseline creatinine 1.83-1.97. 15. Question alcohol use. Monitor for any signs of withdrawal and provide counseling 16.  Hypokalemia likely due to Lasix will supplement KCl- recheck in am   LOS: 2 days A FACE TO [place] E Jessyca Sloan 10/26/2018, 8:44 AM

## 2018-10-26 NOTE — Progress Notes (Signed)
Occupational Therapy Session Note  Patient Details  Name: Dennis Williams MRN: [id number] Date of Birth: [birth year]  Today's Date: 10/26/2018 OT Individual Time: 1000-1055 and 1400-1445 OT Individual Time Calculation (min): 55 min and 45 mins   Short Term Goals: Week 1:  OT Short Term Goal 1 (Week 1): STGs equal to LTGs set at supervision level overall.   Skilled Therapeutic Interventions/Progress Updates:    Session 1: Pt seated in recliner chair upon entering the room. Pt with no c/o pain this session. Pt needing mod A to stand from recliner chair with RW. Pt required encouragement for standing at sink for grooming tasks. Pt standing with min guard and RW at sink for 4 minutes while brushing teeth. Pt returning to sit in wheelchair and propelled wheelchair 100' with supervision and increased time for UB strengthening. OT assisted pt the rest of the way to tub room. OT educated and demonstrated use of RW for transfer onto recommended TTB based on pt's home set up. Pt returning demonstration with min A for safety and balance. Pt returning to chair and propelling wheelchair towards room in same manner. Pt standing from wheelchair height with min guard and ambulating 25' into room with RW and min guard for balance and safety. Pt returning to recliner chair with call bell and all needed items within reach. Chair alarm belt donned. When pt asked questions he did not answer or grunted in response. OT assisted pt with calling his wife and he was observed to be having conversation and talking to her without issue.   Session 2: Upon entering the room, pt seated in recliner chair and agreeable to OT intervention. Pt with no c/o pain this session. Pt verbalizing his legs were cold and requesting to put on pants. Pt standing from recliner chair with min A and not initiating LB clothing management even with cuing to do so. Pt then sitting quickly and verbalized, " I changed my mind." Pt engaged in B UE  strengthening exercises with use of level 2 resistive theraband. Pt performed 3 sets of 10 chest pulls, shoulder flexion, shoulder diagonals, alternating punches, and bicep curls with min verbal cuing for proper technique. Pt taking rest breaks as needed secondary to fatigue. Pt requesting to remain in recliner chair. Chair alarm belt donned and call bell within reach upon exiting the room.   Therapy Documentation Precautions:  Precautions Precautions: Fall Restrictions Weight Bearing Restrictions: No General:   Vital Signs: Therapy Vitals Temp: 97.9 F (36.6 C) Temp Source: Oral Pulse Rate: 80 Resp: 20 BP: 124/69 Patient Position (if appropriate): Sitting Oxygen Therapy SpO2: 98 % O2 Device: Room Air Pain: Pain Assessment Pain Scale: 0-10 Pain Score: 0-No pain ADL: ADL Eating: Supervision/safety Where Assessed-Eating: Wheelchair Grooming: Supervision/safety Where Assessed-Grooming: Wheelchair Upper Body Bathing: Supervision/safety Where Assessed-Upper Body Bathing: Wheelchair Lower Body Bathing: Moderate assistance Where Assessed-Lower Body Bathing: Wheelchair Upper Body Dressing: Moderate assistance Where Assessed-Upper Body Dressing: Wheelchair Lower Body Dressing: Moderate assistance Where Assessed-Lower Body Dressing: Wheelchair Toileting: Moderate assistance Where Assessed-Toileting: Glass blower/designer: Moderate assistance Toilet Transfer Method: Counselling psychologist: Raised toilet seat, Grab bars   Therapy/Group: Individual Therapy  Gypsy Decant 10/26/2018, 12:18 PM
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
## 2018-10-27 ENCOUNTER — Inpatient Hospital Stay (HOSPITAL_COMMUNITY): Payer: Medicare Other | Admitting: Occupational Therapy

## 2018-10-27 ENCOUNTER — Inpatient Hospital Stay (HOSPITAL_COMMUNITY): Payer: Medicare Other

## 2018-10-27 LAB — BASIC METABOLIC PANEL
Anion gap: 11 (ref 5–15)
BUN: 32 mg/dL — ABNORMAL HIGH (ref 8–23)
CO2: 25 mmol/L (ref 22–32)
Calcium: 9.2 mg/dL (ref 8.9–10.3)
Chloride: 103 mmol/L (ref 98–111)
Creatinine, Ser: 1.73 mg/dL — ABNORMAL HIGH (ref 0.61–1.24)
GFR calc Af Amer: 42 mL/min — ABNORMAL LOW (ref >60)
GFR calc non Af Amer: 36 mL/min — ABNORMAL LOW (ref >60)
Glucose, Bld: 158 mg/dL — ABNORMAL HIGH (ref 70–99)
Potassium: 3.5 mmol/L (ref 3.5–5.1)
Sodium: 139 mmol/L (ref 135–145)

## 2018-10-27 LAB — PROTIME-INR
INR: 2.9 — ABNORMAL HIGH (ref 0.8–1.2)
Prothrombin Time: 30.1 seconds — ABNORMAL HIGH (ref 11.4–15.2)

## 2018-10-27 LAB — GLUCOSE, CAPILLARY
Glucose-Capillary: 148 mg/dL — ABNORMAL HIGH (ref 70–99)
Glucose-Capillary: 185 mg/dL — ABNORMAL HIGH (ref 70–99)
Glucose-Capillary: 156 mg/dL — ABNORMAL HIGH (ref 70–99)
Glucose-Capillary: 177 mg/dL — ABNORMAL HIGH (ref 70–99)
Glucose-Capillary: 155 mg/dL — ABNORMAL HIGH (ref 70–99)

## 2018-10-27 MED ORDER — LIVING WELL WITH DIABETES BOOK
Freq: Once | Status: AC
  Administered 2018-10-27: 15:00:00
  Filled 2018-10-27: qty 1

## 2018-10-27 MED ORDER — BLOOD PRESSURE CONTROL BOOK
Freq: Once | Status: AC
  Administered 2018-10-27: 15:00:00
  Filled 2018-10-27: qty 1

## 2018-10-27 MED ORDER — FUROSEMIDE 40 MG PO TABS
60.0000 mg | ORAL_TABLET | Freq: Every day | ORAL | Status: DC
  Administered 2018-10-28 – 2018-11-03 (×7): 60 mg via ORAL
  Filled 2018-10-27 (×7): qty 1

## 2018-10-27 MED ORDER — LIVING BETTER WITH HEART FAILURE BOOK: Freq: Once | Status: DC

## 2018-10-27 MED ORDER — ATORVASTATIN CALCIUM 80 MG PO TABS
80.0000 mg | ORAL_TABLET | Freq: Every day | ORAL | Status: DC
  Administered 2018-10-27 – 2018-11-02 (×7): 80 mg via ORAL
  Filled 2018-10-27 (×7): qty 1

## 2018-10-27 MED ORDER — WARFARIN SODIUM 2.5 MG PO TABS
2.5000 mg | ORAL_TABLET | Freq: Once | ORAL | Status: AC
  Administered 2018-10-27: 2.5 mg via ORAL
  Filled 2018-10-27: qty 1

## 2018-10-27 NOTE — Progress Notes (Signed)
Social Work Patient ID: Dennis Williams, male   DOB: [birth year], 85 y.o.   MRN: [id number]   CSW met with pt 10-26-18 and then talked with his wife, at length, to update them on team conference discussion and expected length of stay and to complete assessment.  Pt expected to be here about 7 to 10 days with supervision level goals, which wife can provide.  She cannot do any lifting over 5 pounds due to rotator cuff surgery. CSW also left a message for pt's dtr as wife stated there was something she wanted to ask CSW.  Await return call.   CSW will continue to follow and assist as needed.

## 2018-10-27 NOTE — Progress Notes (Signed)
Physical Therapy Session Note  Patient Details  Name: Dennis Williams MRN: [id number] Date of Birth: [birth year]  Today's Date: 10/27/2018 PT Individual Time: 0800-0915 PT Individual Time Calculation (min): 75 min   Short Term Goals: Week 1:  PT Short Term Goal 1 (Week 1): STG=LTG due to ELOS.  Skilled Therapeutic Interventions/Progress Updates:     Patient sitting EOB with RN in room upon PT arrival. Patient alert and agreeable to PT session. Reported "I have not felt right since last night" stating that his speech was not normal and he has felt very fatigued and he thought it might be related to his heart. PT assessed vitals, see below, and educated on what a stroke is and stroke symptoms and recovery. MD rounded during session and reinforced education during his assessment.   Therapeutic Activity: Transfers: Patient performed sit to/from stand x1 with mod A with bed elevated, patient stated that he felt very weak, BP in standing 133/75 HR 92 O2 99%. He then performed a stand pivot transfer with min A and throughout the remainder of the session he performed sit to/from stand with min A-CGA with increased time. Provided verbal cues for erect posture in standing and scooting forward in the chair or on the bed before standing.  Gait Training:  Patient ambulated 50 feet x1 and 150' x2 using RW with CGA-close supervision. Ambulated with decreased gait speed, decreased step length and height, increased hip and knee flexion, forward trunk lean, and downward head gaze. Provided verbal cues for erect posture, looking ahead, and increased step height and length. Patient unable to rate RPE on a 10 point scale, but HR was between 80-96 bpm and O2 >94% following each trial with mild SOB. He went up/down 8 3" steps with CGA using a step-to gait pattern with intermittent step-through pattern. He had to place his foot on the step 1-2 times to make sure it was in a safe place before stepping up and down,  stating "my feet have a mind of their own." Following stair training, PT educated patient on what proprioception is, how it is affected by a stroke, and methods for recovery.   Wheelchair Mobility:  Patient was transported in w/c with total A x2 during session for time management and energy conservation between activity.   Neuromuscular Re-ed: Patient performed B step taps 2x10 at 6" step with B UE support with cues for weight shifts and increased hip knee flexion. Provided seated rest break in between. Patient found this activity very challenging.   Patient in w/c at end of session with breaks locked, seat belt alarm set, and all needs within reach.    Therapy Documentation Precautions:  Precautions Precautions: Fall Restrictions Weight Bearing Restrictions: No Vital Signs: Therapy Vitals Pulse Rate: 92 BP: 113/80 Patient Position: Sitting Pulse Rate: 96 BP: 133/75 Patient Position: Standing Oxygen Therapy SpO2: 99 % O2 Device: Room Air Pain:  Patient denied pain throughout session.     Therapy/Group: Individual Therapy  Beautiful Pensyl L Rasheeda Mulvehill PT, DPT  10/27/2018, 12:51 PM

## 2018-10-27 NOTE — Progress Notes (Signed)
Speech Language Pathology Daily Session Note  Patient Details  Name: Dennis Williams MRN: [id number] Date of Birth: 1933-12-10  Today's Date: 10/27/2018 SLP Individual Time: 1130-1230 SLP Individual Time Calculation (min): 60 min  Short Term Goals: Week 1: SLP Short Term Goal 1 (Week 1): Pt will recall basic daily information with Mod A verbal cues for use of external aids. SLP Short Term Goal 1 - Progress (Week 1): Progressing toward goal SLP Short Term Goal 2 (Week 1): Pt will consistently demonstrate Ox4 with min A verbal cues SLP Short Term Goal 2 - Progress (Week 1): Progressing toward goal SLP Short Term Goal 3 (Week 1): Pt will complete basic functional problem solving/thought organization/reasoning for basic and familiar tasks with Mod A SLP Short Term Goal 3 - Progress (Week 1): Partly met  Skilled Therapeutic Interventions: Skilled ST services included use of external aids for orientation and assessment of problem solving skills for current/hom environment. Patient able to respond appropriately to wh-ques re: orientation with use of calendar with 75% accuracy and min verbal cues. During structured task with photographs, patient able to verbalize appropriate problem and solutions to simple and semi-complex problem solving tasks in home environment in 5/6 trials with min verbal cues. During informal conversation with RN during administration of insulin, patient indepedently asked "Why do they give me a banana on my tray [for breakfast] and not expect my sugar level to spike up?". Patient demonstrating adequate progress towards current goals. Will continue to address goals for functional problem solving, safety awareness, and ability to complete functional tasks in home environment. Patient left upright in w/c, chair alarm set, call bell within reach, all needs met.  Pain Pain Assessment Pain Scale: 0-10 Pain Score: 0-No pain  Therapy/Group: Individual Therapy  Loni Beckwith, M.S.  CCC-SLP Speech-Language Pathologist  Loni Beckwith 10/27/2018, 1:18 PM

## 2018-10-27 NOTE — Progress Notes (Signed)
[facility] Individual Statement of Services  Patient Name:  Dennis Williams  Date:  10/27/2018  Welcome to the [facility].  Our goal is to provide you with an individualized program based on your diagnosis and situation, designed to meet your specific needs.  With this comprehensive rehabilitation program, you will be expected to participate in at least 3 hours of rehabilitation therapies Monday-Friday, with modified therapy programming on the weekends.  Your rehabilitation program will include the following services:  Physical Therapy (PT), Occupational Therapy (OT), Speech Therapy (ST), 24 hour per day rehabilitation nursing, Neuropsychology, Case Management (Social Worker), Rehabilitation Medicine, Nutrition Services and Pharmacy Services  Weekly team conferences will be held on Wednesdays to discuss your progress.  Your Social Worker will talk with you frequently to get your input and to update you on team discussions.  Team conferences with you and your family in attendance may also be held.  Expected length of stay: 7 to 10 days  Overall anticipated outcome:  Supervision with contact guard assistance for stairs and minimal assistance for cognitive and memory tasks  Depending on your progress and recovery, your program may change. Your Social Worker will coordinate services and will keep you informed of any changes. Your Social Worker's name and contact numbers are listed  below.  The following services may also be recommended but are not provided by the [facility] will be made to provide these services after discharge if needed.  Arrangements include referral to agencies that provide these services.  Your insurance has been verified to be:  [employer] Your primary doctor is:  Dr. Janace Litten  Pertinent information will be shared with your doctor and your insurance company.  Social Worker:  Alfonse Alpers, LCSW  [phone] or ([phone]  Information discussed with and copy given to patient by: Trey Sailors, 10/27/2018, 12:35 AM

## 2018-10-27 NOTE — Progress Notes (Signed)
[facility] PHYSICAL MEDICINE & REHABILITATION PROGRESS NOTE   Subjective/Complaints:  Pt c/o some increased speech problems before he went to bed last noc,  States he was very tired and slept 9 hrs without moving  Speech ok today   Review of systems denies chest pain shortness of breath nausea vomiting diarrhea or constipation  Objective:   No results found. Recent Labs    10/25/18 0520  WBC 8.1  HGB 14.8  HCT 43.9  PLT 157   Recent Labs    10/25/18 0520 10/27/18 0506  NA 140 139  K 3.2* 3.5  CL 104 103  CO2 25 25  GLUCOSE 162* 158*  BUN 32* 32*  CREATININE 1.99* 1.73*  CALCIUM 9.0 9.2    Intake/Output Summary (Last 24 hours) at 10/27/2018 0909 Last data filed at 10/26/2018 1851 Gross per 24 hour  Intake 440 ml  Output 108 ml  Net 332 ml     Physical Exam: Vital Signs Blood pressure 110/80, pulse 95, temperature 97.7 F (36.5 C), temperature source Oral, resp. rate 16, height 5\' 8"  (1.727 m), weight 107.1 kg, SpO2 99 %.   General: No acute distress Mood and affect are appropriate Heart: Regular rate and rhythm no rubs murmurs or extra sounds Lungs: Clear to auscultation, breathing unlabored, no rales or wheezes Abdomen: Positive bowel sounds, soft nontender to palpation, nondistended Extremities: No clubbing, cyanosis, or edema Skin: No evidence of breakdown, chronic stasis dermatitis changes bilateral pretibial area Neurologic: Cranial nerves II through XII intact, motor strength is 5/5 in right and 4/5 left deltoid, bicep, tricep, grip, hip flexor, knee extensors, ankle dorsiflexor and plantar flexor Speech without dysarthria or aphasia   Musculoskeletal: Full range of motion in all 4 extremities. No joint swelling   Assessment/Plan: 1. Functional deficits secondary to right frontal infarct with left hemiparesis which require 3+ hours per day of interdisciplinary therapy in a comprehensive inpatient rehab setting.  Physiatrist is providing close team  supervision and 24 hour management of active medical problems listed below.  Physiatrist and rehab team continue to assess barriers to discharge/monitor patient progress toward functional and medical goals  Care Tool:  Bathing    Body parts bathed by patient: Right arm, Left arm, Chest, Face, Right lower leg, Left lower leg, Left upper leg, Right upper leg, Abdomen     Body parts n/a: Front perineal area, Buttocks(pt completed earlier with nursing per her report)   Bathing assist Assist Level: Moderate Assistance - Patient 50 - 74%     Upper Body Dressing/Undressing Upper body dressing   What is the patient wearing?: Button up shirt    Upper body assist Assist Level: Minimal Assistance - Patient > 75%    Lower Body Dressing/Undressing Lower body dressing    Lower body dressing activity did not occur: N/A What is the patient wearing?: Incontinence brief, Pants     Lower body assist Assist for lower body dressing: Moderate Assistance - Patient 50 - 74%     Toileting Toileting    Toileting assist Assist for toileting: Moderate Assistance - Patient 50 - 74%     Transfers Chair/bed transfer  Transfers assist  Chair/bed transfer activity did not occur: N/A  Chair/bed transfer assist level: Moderate Assistance - Patient 50 - 74%     Locomotion Ambulation   Ambulation assist      Assist level: Contact Guard/Touching assist Assistive device: Walker-rolling Max distance: 25'   Walk 10 feet activity   Assist  Assist level: Contact Guard/Touching assist Assistive device: Walker-rolling   Walk 50 feet activity   Assist Walk 50 feet with 2 turns activity did not occur: Safety/medical concerns(decreased strength/activity tolerance)  Assist level: Contact Guard/Touching assist Assistive device: Walker-rolling    Walk 150 feet activity   Assist Walk 150 feet activity did not occur: Safety/medical concerns(decreased strength/activity tolerance)          Walk 10 feet on uneven surface  activity   Assist Walk 10 feet on uneven surfaces activity did not occur: Safety/medical concerns(Per report)         Wheelchair     Assist Will patient use wheelchair at discharge?: Yes Type of Wheelchair: Manual    Wheelchair assist level: Supervision/Verbal cueing Max wheelchair distance: 100'    Wheelchair 50 feet with 2 turns activity    Assist        Assist Level: Supervision/Verbal cueing   Wheelchair 150 feet activity     Assist Wheelchair 150 feet activity did not occur: Safety/medical concerns(decreased strength/activity tolerance)        Medical Problem List and Plan: 1.Left-sided weaknesssecondary to small acute posterior right frontal infarction, Neuro exam stable 5/29 CIR PT,  OT, SLP 2. Antithrombotics: -DVT/anticoagulation:Chronic Coumadin -antiplatelet therapy: N/A 3. Pain Management:Tylenol as needed 4. Mood:Recent diagnosis of dementia -antipsychotic agents: N/A 5. Neuropsych: This patientiscapable of making decisions on hisown behalf. 6. Skin/Wound Care:Routine skin checks 7. Fluids/Electrolytes/Nutrition:Routine in and outs with follow-up chemistries 8.Atrial fibrillation. Cardiac rate controlled. Continue Coumadin. Vitals:   10/27/18 0557 10/27/18 0802  BP: 123/75 110/80  Pulse: 70 95  Resp: 20 16  Temp: 97.8 F (36.6 C) 97.7 F (36.5 C)  SpO2: 97% 99%   9.Diabetes mellitus with peripheral neuropathy. Lantus insulin 30 units nightly. Check blood sugars before meals and at bedtime CBG (last 3)  Recent Labs    10/26/18 1711 10/26/18 2133 10/27/18 0732  GLUCAP 167* 183* 185*  will increase Lantus to 33U 10.Chronic diastolic congestive heart failure. Monitor for any signs of fluid overload. Lasix 80 mg daily.  Currently without lower extremity edema Filed Weights   10/25/18 0440 10/26/18 0333 10/27/18 0557  Weight: 109.2 kg  108.6 kg 107.1 kg  no evidence of wt gain , in fact dropping but BUN stable cont  reduce lasix to 60mg   11. Hyperlipidemia. Lipitor 12. History of tobacco use. Provide counseling 13. OSA. Continue CPAP 14.Chronic renal insufficiency with baseline creatinine 1.83-1.97. 15. Question alcohol use. Monitor for any signs of withdrawal and provide counseling 16.  Hypokalemia likely due to Lasix will supplement KCl- recheck normalized to 3.5   LOS: 3 days A FACE TO [place] E Tessa Seaberry 10/27/2018, 9:09 AM

## 2018-10-27 NOTE — Progress Notes (Signed)
Occupational Therapy Session Note  Patient Details  Name: Dennis Williams MRN: [id number] Date of Birth: 1934-03-04  Today's Date: 10/27/2018 OT Individual Time: 1500-1515 OT Individual Time Calculation (min): 15 min    Short Term Goals: Week 1:  OT Short Term Goal 1 (Week 1): STGs equal to LTGs set at supervision level overall.   Skilled Therapeutic Interventions/Progress Updates:    Patient with nursing at start of session in bathroom - hygiene with max A, pants up/down min A.  SPT to/from toilet, w/c and recliner with CGA.  Patient completed hand washing at sink with set up.  He transferred to the recliner and stated that he was too fatigued to complete additional therapy this afternoon.  Reviewed goals for therapy, therapy schedule and plan of care.  Will attempt again as schedule allows.    Therapy Documentation Precautions:  Precautions Precautions: Fall Restrictions Weight Bearing Restrictions: No General: General OT Amount of Missed Time: 15 Minutes Vital Signs: Therapy Vitals Temp: 98.7 F (37.1 C) Temp Source: Oral Pulse Rate: 80 Resp: 18 BP: 123/76 Patient Position (if appropriate): Sitting Oxygen Therapy SpO2: 99 % O2 Device: Room Air Pain: Pain Assessment Pain Scale: 0-10 Pain Score: 0-No pain   Other Treatments:     Therapy/Group: Individual Therapy  Carlos Levering 10/27/2018, 3:43 PM
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
## 2018-10-27 NOTE — Progress Notes (Signed)
Occupational Therapy Session Note  Patient Details  Name: Dennis Williams MRN: [id number] Date of Birth: 1934/03/17  Today's Date: 10/27/2018 OT Individual Time: 0102-7253 and 1330-1414 OT Individual Time Calculation (min): 31 min and 44 min  Short Term Goals: Week 1:  OT Short Term Goal 1 (Week 1): STGs equal to LTGs set at supervision level overall.   Skilled Therapeutic Interventions/Progress Updates:    Pt greeted in w/c with no c/o pain. Wanting to start session by brushing his teeth. Pt ambulated with RW to sink with close supervision. He stood to brush his teeth with the same assist, but reported feeling too tired to remain standing for other grooming tasks. Pt sat in the w/c to rest. He then reported needing to use the bathroom. Stand pivot<toilet completed with steady assist using grab bar. Steady assist for toileting tasks with pt having continent bladder void. He sat at the sink to complete handwashing as he still felt too fatigued to stand. To work on [employer], had pt self propel w/c towards supply closet to retrieve lotion and comb for room. He made it approx 70 ft before reporting that his hands were bothering him too much to continue. OT escorted him remainder of way to gather stated items, and then he was taken back to room. Pt brushed his hair sitting at the sink and OT assisted with applying lotion to LEs. Inquired RN about prescription lotion to address dry skin. At end of session pt was left in w/c with safety belt fastened and all needs. Tx focus placed on standing balance, functional ambulation, and activity tolerance.   2nd session 1:1 tx (44 min) Pt greeted in recliner with no c/o pain. Declining several suggestions for therapeutic activities to participate in. With max encouragement, pt willing to engage in shaving. He required supervision/cuing while sitting at sink. Pt adamant that he could not reuse razors that had been rinsed by OT to remove hair build up. He  would not attempt to rinse them himself, though encouraged. Pt also required vcs for cleanup. Supervision for oral care and hand hygiene afterwards. Pt refused to stand to engage in stated tasks due to fatigue. He was left in recliner with all needs within reach and safety belt fastened.    Therapy Documentation Precautions:  Precautions Precautions: Fall Restrictions Weight Bearing Restrictions: No ADL: ADL Eating: Supervision/safety Where Assessed-Eating: Wheelchair Grooming: Supervision/safety Where Assessed-Grooming: Wheelchair Upper Body Bathing: Supervision/safety Where Assessed-Upper Body Bathing: Wheelchair Lower Body Bathing: Moderate assistance Where Assessed-Lower Body Bathing: Wheelchair Upper Body Dressing: Moderate assistance Where Assessed-Upper Body Dressing: Wheelchair Lower Body Dressing: Moderate assistance Where Assessed-Lower Body Dressing: Wheelchair Toileting: Moderate assistance Where Assessed-Toileting: Glass blower/designer: Moderate assistance Toilet Transfer Method: Counselling psychologist: Raised toilet seat, Grab bars :     Therapy/Group: Individual Therapy  Akil Hoos A Hermina Barnard 10/27/2018, 12:10 PM

## 2018-10-28 ENCOUNTER — Inpatient Hospital Stay (HOSPITAL_COMMUNITY): Payer: Medicare Other | Admitting: Occupational Therapy

## 2018-10-28 ENCOUNTER — Inpatient Hospital Stay (HOSPITAL_COMMUNITY): Payer: Medicare Other | Admitting: Speech Pathology

## 2018-10-28 ENCOUNTER — Inpatient Hospital Stay (HOSPITAL_COMMUNITY): Payer: Medicare Other | Admitting: Physical Therapy

## 2018-10-28 DIAGNOSIS — E1142 Type 2 diabetes mellitus with diabetic polyneuropathy: Secondary | ICD-10-CM

## 2018-10-28 DIAGNOSIS — I5032 Chronic diastolic (congestive) heart failure: Secondary | ICD-10-CM

## 2018-10-28 DIAGNOSIS — N183 Chronic kidney disease, stage 3 (moderate): Secondary | ICD-10-CM

## 2018-10-28 DIAGNOSIS — E876 Hypokalemia: Secondary | ICD-10-CM

## 2018-10-28 LAB — GLUCOSE, CAPILLARY
Glucose-Capillary: 169 mg/dL — ABNORMAL HIGH (ref 70–99)
Glucose-Capillary: 192 mg/dL — ABNORMAL HIGH (ref 70–99)
Glucose-Capillary: 184 mg/dL — ABNORMAL HIGH (ref 70–99)
Glucose-Capillary: 177 mg/dL — ABNORMAL HIGH (ref 70–99)

## 2018-10-28 LAB — PROTIME-INR
INR: 2.6 — ABNORMAL HIGH (ref 0.8–1.2)
Prothrombin Time: 27.2 seconds — ABNORMAL HIGH (ref 11.4–15.2)

## 2018-10-28 MED ORDER — WARFARIN SODIUM 5 MG PO TABS
5.0000 mg | ORAL_TABLET | Freq: Every day | ORAL | Status: DC
  Administered 2018-10-28 – 2018-11-02 (×6): 5 mg via ORAL
  Filled 2018-10-28 (×6): qty 1

## 2018-10-28 MED ORDER — INSULIN GLARGINE 100 UNIT/ML ~~LOC~~ SOLN
35.0000 [IU] | Freq: Every day | SUBCUTANEOUS | Status: DC
  Administered 2018-10-28 – 2018-10-31 (×4): 35 [IU] via SUBCUTANEOUS
  Filled 2018-10-28 (×6): qty 0.35

## 2018-10-28 NOTE — Progress Notes (Signed)
Occupational Therapy Session Note  Patient Details  Name: Dennis Williams MRN: [id number] Date of Birth: 03/16/1934  Today's Date: 10/28/2018 OT Individual Time: 3612-2449 OT Individual Time Calculation (min): 9 min  66 minutes missed due to pt refusal   Short Term Goals: Week 1:  OT Short Term Goal 1 (Week 1): STGs equal to LTGs set at supervision level overall.   Skilled Therapeutic Interventions/Progress Updates:    Pt greeted EOB after eating breakfast. He declined participation in therapeutic activities/ADL tasks suggested and unable to state what he would like to do. Tried to gently converse with pt to establish rapport, however pt minimally conversive. When encouragement was provided, his agitation appeared to increase, with pt stating "Leave me alone." Therefore pt was left EOB with bed alarm set and all needs within reach. RN made aware of therapy refusal. 66 minutes missed.    Therapy Documentation Precautions:  Precautions Precautions: Fall Restrictions Weight Bearing Restrictions: No Pain: Pt shook head when asked if he had pain    ADL: ADL Eating: Supervision/safety Where Assessed-Eating: Wheelchair Grooming: Supervision/safety Where Assessed-Grooming: Wheelchair Upper Body Bathing: Supervision/safety Where Assessed-Upper Body Bathing: Wheelchair Lower Body Bathing: Moderate assistance Where Assessed-Lower Body Bathing: Wheelchair Upper Body Dressing: Moderate assistance Where Assessed-Upper Body Dressing: Wheelchair Lower Body Dressing: Moderate assistance Where Assessed-Lower Body Dressing: Wheelchair Toileting: Moderate assistance Where Assessed-Toileting: Glass blower/designer: Moderate assistance Toilet Transfer Method: Counselling psychologist: Raised toilet seat, Grab bars     Therapy/Group: Individual Therapy  Uchenna Seufert A Ademide Schaberg 10/28/2018, 2:28 PM

## 2018-10-28 NOTE — Progress Notes (Signed)
Pt wife called nurse earlier this morning concerned about pt. Pt had called wife and stated he was sorry and he had cussed at a therapist this morning and he just felt bad, wife stated since pt had become sick she noticed changes in behavior and cussing habit. Explained to wife that behaviors can change after a stroke and pt has noted to be confused at times. Nurse in to assess pt, approaching sitting EOB with bed alarm in place. Pt refused to lay in bed or get in recliner. Pt seemed flat affect and depressed. Very short responses but no cussing noted. Pt stated he was hungry still so nurse offered fruit/yogurt. Very concerned about eating them d/t running his sugar too high. Explained to pt its important to eat to have strength to participate with therapy. Nurse gave DM booklet to pt to look over. Left sitting on EOB per pt refusal to lay down. Call bell in reach and bed alarm present. Will cont to monitor.   Rosita Fire, RN

## 2018-10-28 NOTE — Progress Notes (Signed)
Speech Language Pathology Daily Session Note  Patient Details  Name: Dennis Williams MRN: [id number] Date of Birth: 08-03-1933  Today's Date: 10/28/2018 SLP Individual Time: 34-1425 SLP Individual Time Calculation (min): 40 min  Short Term Goals: Week 1: SLP Short Term Goal 1 (Week 1): Pt will recall basic daily information with Mod A verbal cues for use of external aids. SLP Short Term Goal 1 - Progress (Week 1): Progressing toward goal SLP Short Term Goal 2 (Week 1): Pt will consistently demonstrate Ox4 with min A verbal cues SLP Short Term Goal 2 - Progress (Week 1): Progressing toward goal SLP Short Term Goal 3 (Week 1): Pt will complete basic functional problem solving/thought organization/reasoning for basic and familiar tasks with Mod A SLP Short Term Goal 3 - Progress (Week 1): Partly met  Skilled Therapeutic Interventions: Skilled treatment session focused on cognitive goals. Upon arrival, patient was sitting EOB and requested to use the bathroom. Patient required Min verbal cues for problem solving with transfer to the wheelchair and was continent of urine. Patient then agreeable to participate in basic self-care tasks like brushing his teeth, etc with overall Mod I. Patient agreeable to sit up in recliner remainder of session. Patient independently recalled events from morning therapy session and was oriented to place, situation, year and month with total A needed for date. Patient remains flat and requires extra time to complete tasks but was overall more engaged and verbal this session. Patient left upright in recliner with alarm on and all needs within reach. Continue with current plan of care.      Pain No/Denies Pain   Therapy/Group: Individual Therapy  Saralyn Willison 10/28/2018, 2:28 PM

## 2018-10-28 NOTE — Progress Notes (Signed)
[facility] for Coumadin Indication: atrial fibrillation  Allergies  Allergen Reactions  . Dilaudid [Hydromorphone Hcl] Nausea And Vomiting and Other (See Comments)    Felt hot  . No Known Allergies    Patient Measurements: Height: 5\' 8"  (172.7 cm) Weight: 236 lb 1.8 oz (107.1 kg) IBW/kg (Calculated) : 68.4 Vital Signs: Temp: 97.7 F (36.5 C) (05/30 0358) Temp Source: Oral (05/30 0358) BP: 142/91 (05/30 0358) Pulse Rate: 80 (05/30 0358)  Labs: Recent Labs    10/26/18 0558 10/27/18 0506 10/28/18 0718  LABPROT 31.2* 30.1* 27.2*  INR 3.1* 2.9* 2.6*  CREATININE  --  1.73*  --    Estimated Creatinine Clearance: 39.1 mL/min (A) (by C-G formula based on SCr of 1.73 mg/dL (H)).    Assessment: 85 y.o. male admitted with new onset weakness, possible CVA, h/o Afib, to continue Coumadin.  INR 3.6 in office 5/19, Coumadin not given 5/19-5/21.  Previously taking Coumadin 6 mg daily, but dose reduced to 5.5 mg daily starting 5/22.    INR today = 2.6  Goal of Therapy:  INR 2-3 Monitor platelets by anticoagulation protocol: Yes    Plan:  Try Warfarin 5 mg po daily Daily INR  Thank you Anette Guarneri, PharmD Clinical Pharmacist 10/28/2018,8:42 AM

## 2018-10-28 NOTE — Progress Notes (Signed)
[facility] PHYSICAL MEDICINE & REHABILITATION PROGRESS NOTE   Subjective/Complaints: Patient seen sitting up at the edge of his bed this morning.  He states he slept well overnight.  Good sitting balance noted.  Review of systems: Denies CP, SOB, N/V/D  Objective:   No results found. No results for input(s): WBC, HGB, HCT, PLT in the last 72 hours. Recent Labs    10/27/18 0506  NA 139  K 3.5  CL 103  CO2 25  GLUCOSE 158*  BUN 32*  CREATININE 1.73*  CALCIUM 9.2    Intake/Output Summary (Last 24 hours) at 10/28/2018 1158 Last data filed at 10/28/2018 0900 Gross per 24 hour  Intake 480 ml  Output -  Net 480 ml     Physical Exam: Vital Signs Blood pressure (!) 142/91, pulse 80, temperature 97.7 F (36.5 C), temperature source Oral, resp. rate 18, height 5\' 8"  (1.727 m), weight 107.1 kg, SpO2 96 %.   Constitutional: No distress . Vital signs reviewed. HENT: Normocephalic.  Atraumatic. Eyes: EOMI. No discharge. Cardiovascular: No JVD. Respiratory: Normal effort. GI: Non-distended. Musc: No edema or tenderness in extremities. Skin: Vascular changes bilateral lower extremities Neurologic: Alert Motor: RUE/RLE: 4+/5 proximal distal LUE/LLE: 4/5 proximal distal Psych: Very flat.  Assessment/Plan: 1. Functional deficits secondary to right frontal infarct with left hemiparesis which require 3+ hours per day of interdisciplinary therapy in a comprehensive inpatient rehab setting.  Physiatrist is providing close team supervision and 24 hour management of active medical problems listed below.  Physiatrist and rehab team continue to assess barriers to discharge/monitor patient progress toward functional and medical goals  Care Tool:  Bathing    Body parts bathed by patient: Right arm, Left arm, Chest, Face, Right lower leg, Left lower leg, Left upper leg, Right upper leg, Abdomen     Body parts n/a: Front perineal area, Buttocks(pt completed earlier with nursing per  her report)   Bathing assist Assist Level: Moderate Assistance - Patient 50 - 74%     Upper Body Dressing/Undressing Upper body dressing   What is the patient wearing?: Button up shirt    Upper body assist Assist Level: Minimal Assistance - Patient > 75%    Lower Body Dressing/Undressing Lower body dressing    Lower body dressing activity did not occur: N/A What is the patient wearing?: Incontinence brief, Pants     Lower body assist Assist for lower body dressing: Moderate Assistance - Patient 50 - 74%     Toileting Toileting    Toileting assist Assist for toileting: Minimal Assistance - Patient > 75%     Transfers Chair/bed transfer  Transfers assist  Chair/bed transfer activity did not occur: N/A  Chair/bed transfer assist level: Minimal Assistance - Patient > 75%     Locomotion Ambulation   Ambulation assist      Assist level: Contact Guard/Touching assist Assistive device: Walker-rolling Max distance: 150'   Walk 10 feet activity   Assist     Assist level: Contact Guard/Touching assist Assistive device: Walker-rolling   Walk 50 feet activity   Assist Walk 50 feet with 2 turns activity did not occur: Safety/medical concerns(decreased strength/activity tolerance)  Assist level: Contact Guard/Touching assist Assistive device: Walker-rolling    Walk 150 feet activity   Assist Walk 150 feet activity did not occur: Safety/medical concerns(decreased strength/activity tolerance)  Assist level: Contact Guard/Touching assist Assistive device: Walker-rolling    Walk 10 feet on uneven surface  activity   Assist Walk 10 feet on uneven surfaces  activity did not occur: Safety/medical concerns(Per report)         Wheelchair     Assist Will patient use wheelchair at discharge?: Yes Type of Wheelchair: Manual    Wheelchair assist level: Supervision/Verbal cueing Max wheelchair distance: 100'    Wheelchair 50 feet with 2 turns  activity    Assist        Assist Level: Supervision/Verbal cueing   Wheelchair 150 feet activity     Assist Wheelchair 150 feet activity did not occur: Safety/medical concerns(decreased strength/activity tolerance)        Medical Problem List and Plan: 1.Left-sided weaknesssecondary to small acute posterior right frontal infarction  Continue CIR  Notes reviewed-stroke, labs reviewed 2. Antithrombotics: -DVT/anticoagulation:Chronic Coumadin -antiplatelet therapy: N/A 3. Pain Management:Tylenol as needed 4. Mood:Recent diagnosis of dementia -antipsychotic agents: N/A 5. Neuropsych: This patientis?  Fully capable of making decisions on hisown behalf. 6. Skin/Wound Care:Routine skin checks 7. Fluids/Electrolytes/Nutrition:Routine in and outs with follow-up chemistries 8.Atrial fibrillation. Cardiac rate controlled. Continue Coumadin. Vitals:   10/27/18 1952 10/28/18 0358  BP: 127/80 (!) 142/91  Pulse: 82 80  Resp: 18 18  Temp: 97.8 F (36.6 C) 97.7 F (36.5 C)  SpO2: 96% 96%   Blood pressure labile on 5/30, monitor for trend 9.Diabetes mellitus with peripheral neuropathy. Check blood sugars before meals and at bedtime CBG (last 3)  Recent Labs    10/27/18 2047 10/28/18 0608 10/28/18 1151  GLUCAP 155* 169* 192*   Lantus increased to 35 nightly on 5/30 10.Chronic diastolic congestive heart failure. Monitor for any signs of fluid overload. Lasix 80 mg daily.  Currently without lower extremity edema Filed Weights   10/25/18 0440 10/26/18 0333 10/27/18 0557  Weight: 109.2 kg 108.6 kg 107.1 kg   Improving on 5/30  11. Hyperlipidemia. Lipitor 12. History of tobacco use. Provide counseling 13. OSA. Continue CPAP 14.Chronic renal insufficiency with baseline creatinine 1.83-1.97.  Creatinine 1.73 on 5/29, labs ordered for Monday 15. Question alcohol use. Monitor for any signs of withdrawal and provide  counseling 16.  Hypokalemia likely due to Lasix will supplement KCl  Potassium 3.5 on 5/29, labs ordered for Monday  LOS: 4 days A FACE TO FACE EVALUATION WAS PERFORMED  Ankit Lorie Phenix 10/28/2018, 11:58 AM

## 2018-10-28 NOTE — Progress Notes (Signed)
RT Note:  Patient stated he does not use CPAP at home.  Patient refused CPAP.

## 2018-10-28 NOTE — Progress Notes (Signed)
Physical Therapy Session Note  Patient Details  Name: Dennis Williams MRN: [id number] Date of Birth: 04/29/1934  Today's Date: 10/28/2018 PT Individual Time: 7353-2992 PT Individual Time Calculation (min): 60 min   Short Term Goals: Week 1:  PT Short Term Goal 1 (Week 1): STG=LTG due to ELOS.  Skilled Therapeutic Interventions/Progress Updates:   Pt received sitting in WC and agreeable to PT. Donning shoes with min assist from PT. Gait training with RW x [id number] + [id number] supervision assist for safety. One near LOB with lateral head to look at RN while ambulating, but no additional assist beyond CGA require to prevent fall. Min cues for step height on the L intermittently .   PT instructed pt in dynamic balance and reciprocal movement training: stepping over 1 inch cane x 10 BLE foot tap on 4 inch step with BUE support on rail x 3 BLE and then weakness in knees with mild buckling. Side stepping in parallel bars 4 x [id number] min assist from PT . Forward/backward giat in parallel bars 4 x 5 ft with min assist. No  additional knee instability throughout rest of PT treatment. All sit<>stand from WC [facility] parallel bars and RW with supervision assist and increased time.   PT instructed pt in doffing shoes once back in recliner with set up Assist and min cues for safety when reaching to floor. Patient left sitting in WC [facility] call bell in reach and all needs met.           Therapy Documentation Precautions:  Precautions Precautions: Fall Restrictions Weight Bearing Restrictions: No General: PT Amount of Missed Time (min): 15 Minutes PT Missed Treatment Reason: Patient fatigue Vital Signs: Therapy Vitals Temp: 98.4 F (36.9 C) Temp Source: Oral Pulse Rate: 88 Resp: 18 BP: 112/73 Patient Position (if appropriate): Sitting Oxygen Therapy SpO2: 97 % O2 Device: Room Air Pain:   denies   Therapy/Group: Individual Therapy  AusLorie Phenix30/2020, 5:35 PM

## 2018-10-29 ENCOUNTER — Inpatient Hospital Stay (HOSPITAL_COMMUNITY): Payer: Medicare Other | Admitting: Physical Therapy

## 2018-10-29 LAB — GLUCOSE, CAPILLARY
Glucose-Capillary: 150 mg/dL — ABNORMAL HIGH (ref 70–99)
Glucose-Capillary: 176 mg/dL — ABNORMAL HIGH (ref 70–99)
Glucose-Capillary: 165 mg/dL — ABNORMAL HIGH (ref 70–99)
Glucose-Capillary: 145 mg/dL — ABNORMAL HIGH (ref 70–99)

## 2018-10-29 LAB — PROTIME-INR
INR: 2.6 — ABNORMAL HIGH (ref 0.8–1.2)
Prothrombin Time: 27.3 seconds — ABNORMAL HIGH (ref 11.4–15.2)

## 2018-10-29 MED ORDER — FLUOXETINE HCL 10 MG PO CAPS
10.0000 mg | ORAL_CAPSULE | Freq: Every day | ORAL | Status: DC
  Administered 2018-10-29 – 2018-10-30 (×2): 10 mg via ORAL
  Filled 2018-10-29 (×2): qty 1

## 2018-10-29 NOTE — Progress Notes (Signed)
Physical Therapy Session Note  Patient Details  Name: Dennis Williams MRN: [id number] Date of Birth: 10/23/1933  Today's Date: 10/29/2018 PT Individual Time: 1120-1205 PT Individual Time Calculation (min): 45 min   Short Term Goals: Week 1:  PT Short Term Goal 1 (Week 1): STG=LTG due to ELOS.  Skilled Therapeutic Interventions/Progress Updates: Pt presented in w/c agreeable to therapy. Pt denies pain at rest. Pt transported to rehab gym for time management and energy conservation. Pt performed ambulatory transfer to mat CGA. Participated in weight shifting performing toe taps x 10 bilaterally then alternating x 10, pt initially performed with RW. Performed second bout of alternating toe taps without AD with pt requiring modA. Pt noted to have increased anxiety when performing any activity without AD and frequently reaching out for PTA. Upon sitting without AD pt indicating increased pain in R knee, pt unable to rate but did state feeling better with rest. Continued balance activities with RW including a round of horseshoes with forced to reach outside BOS and crossing midline. Pt noted to require much encouragement to reach outside BOS. Pt also participated in alternating hands and placing horsehshoes on basketball hoop with fair tolerance. Pt taking several seated rest breaks during activities with minor c/o pain in R knee. Pt then performed ambulatory transfer to w/c CGA. Pt transported to day room and participated in NuStep L2 x 5 min for global conditioning. Pt initially c/o pain in R knee but resolved with activity. Pt performed ambulatory transfers CGA between w/c and NuStep. Pt transported back to room and remained in w/c at end of session. Pt left with belt alarm on, call bell within reach and NT present to perform BS check.      Therapy Documentation Precautions:  Precautions Precautions: Fall Restrictions Weight Bearing Restrictions: No General:   Vital Signs: Therapy Vitals Temp:  98 F (36.7 C) Temp Source: Oral Pulse Rate: 92 Resp: 20 BP: 119/75 Patient Position (if appropriate): Sitting Oxygen Therapy SpO2: 99 % O2 Device: Room Air    Therapy/Group: Individual Therapy  Landree Fernholz  Corissa Oguinn, PTA  10/29/2018, 3:37 PM

## 2018-10-29 NOTE — Progress Notes (Signed)
Occupational Therapy Session Note  Patient Details  Name: Dennis Williams MRN: [id number] Date of Birth: 09/02/1933  Today's Date: 10/29/2018 OT Individual Time: 1500-1550 OT Individual Time Calculation (min): 50 min   Short Term Goals: Week 1:  OT Short Term Goal 1 (Week 1): STGs equal to LTGs set at supervision level overall.   Skilled Therapeutic Interventions/Progress Updates:    Pt greeted in w/c. Agreeable to makeup therapy time with encouragement. He declined to engage in any self care tasks suggested, including toileting. Therefore worked on standing balance and motor planning while completing FM peg puzzle. Sit<stand completed with supervision. Pt engaged in task with CGA and unilateral UE support on walker. Pt required step by step cues for sequencing puzzles of min-mod complexity. Exhibited difficultly at times with orientation (I.e. he would copy a vertical pattern correctly but horizontally). Noted deficits with contrast sensitivity as well. Afterwards he self propelled ~75 ft down hallway to work on [place]. He needed assist to correct Lt veering and also when turning. Pt returned to room and was left in w/c with all needs within reach and safety belt fastened.   Therapy Documentation Precautions:  Precautions Precautions: Fall Restrictions Weight Bearing Restrictions: No Vital Signs: Therapy Vitals Temp: 98 F (36.7 C) Temp Source: Oral Pulse Rate: 92 Resp: 20 BP: 119/75 Patient Position (if appropriate): Sitting Oxygen Therapy SpO2: 99 % O2 Device: Room Air Pain: No c/o pain during tx Pain Assessment Pain Scale: 0-10 Pain Score: 0-No pain ADL: ADL Eating: Supervision/safety Where Assessed-Eating: Wheelchair Grooming: Supervision/safety Where Assessed-Grooming: Wheelchair Upper Body Bathing: Supervision/safety Where Assessed-Upper Body Bathing: Wheelchair Lower Body Bathing: Moderate assistance Where Assessed-Lower Body Bathing: Wheelchair Upper  Body Dressing: Moderate assistance Where Assessed-Upper Body Dressing: Wheelchair Lower Body Dressing: Moderate assistance Where Assessed-Lower Body Dressing: Wheelchair Toileting: Moderate assistance Where Assessed-Toileting: Glass blower/designer: Moderate assistance Toilet Transfer Method: Counselling psychologist: Raised toilet seat, Grab bars     Therapy/Group: Individual Therapy  Melayah Skorupski A Jerett Odonohue 10/29/2018, 4:45 PM

## 2018-10-29 NOTE — Progress Notes (Signed)
[place] PHYSICAL MEDICINE & REHABILITATION PROGRESS NOTE   Subjective/Complaints: Patient seen laying in bed this morning.  He states he slept well overnight.  Discussed possible depression with nursing.  Review of systems: Denies CP, SOB, N/V/D  Objective:   No results found. No results for input(s): WBC, HGB, HCT, PLT in the last 72 hours. Recent Labs    10/27/18 0506  NA 139  K 3.5  CL 103  CO2 25  GLUCOSE 158*  BUN 32*  CREATININE 1.73*  CALCIUM 9.2    Intake/Output Summary (Last 24 hours) at 10/29/2018 1027 Last data filed at 10/29/2018 0757 Gross per 24 hour  Intake 480 ml  Output 400 ml  Net 80 ml     Physical Exam: Vital Signs Blood pressure (!) 144/69, pulse 78, temperature 97.8 F (36.6 C), temperature source Oral, resp. rate 18, height 5\' 8"  (1.727 m), weight 107.1 kg, SpO2 95 %.   Constitutional: No distress . Vital signs reviewed. HENT: Normocephalic.  Atraumatic Eyes: EOMI.  No discharge. Cardiovascular: No JVD. Respiratory: Normal effort. GI: Non-distended. Musc: No edema or tenderness in extremities. Skin: Vascular changes bilateral lower extremities Neurologic: Alert Motor: RUE/RLE: 4+/5 proximal distal, stable LUE/LLE: 4/5 proximal distal Psych: Very flat.  Assessment/Plan: 1. Functional deficits secondary to right frontal infarct with left hemiparesis which require 3+ hours per day of interdisciplinary therapy in a comprehensive inpatient rehab setting.  Physiatrist is providing close team supervision and 24 hour management of active medical problems listed below.  Physiatrist and rehab team continue to assess barriers to discharge/monitor patient progress toward functional and medical goals  Care Tool:  Bathing    Body parts bathed by patient: Right arm, Left arm, Chest, Face, Right lower leg, Left lower leg, Left upper leg, Right upper leg, Abdomen     Body parts n/a: Front perineal area, Buttocks(pt completed earlier with  nursing per her report)   Bathing assist Assist Level: Moderate Assistance - Patient 50 - 74%     Upper Body Dressing/Undressing Upper body dressing   What is the patient wearing?: Button up shirt    Upper body assist Assist Level: Minimal Assistance - Patient > 75%    Lower Body Dressing/Undressing Lower body dressing    Lower body dressing activity did not occur: N/A What is the patient wearing?: Incontinence brief, Pants     Lower body assist Assist for lower body dressing: Moderate Assistance - Patient 50 - 74%     Toileting Toileting    Toileting assist Assist for toileting: Minimal Assistance - Patient > 75%     Transfers Chair/bed transfer  Transfers assist  Chair/bed transfer activity did not occur: N/A  Chair/bed transfer assist level: Minimal Assistance - Patient > 75%     Locomotion Ambulation   Ambulation assist      Assist level: Supervision/Verbal cueing Assistive device: Walker-rolling Max distance: 150   Walk 10 feet activity   Assist     Assist level: Supervision/Verbal cueing Assistive device: Walker-rolling   Walk 50 feet activity   Assist Walk 50 feet with 2 turns activity did not occur: Safety/medical concerns(decreased strength/activity tolerance)  Assist level: Supervision/Verbal cueing Assistive device: Walker-rolling    Walk 150 feet activity   Assist Walk 150 feet activity did not occur: Safety/medical concerns(decreased strength/activity tolerance)  Assist level: Contact Guard/Touching assist Assistive device: Walker-rolling    Walk 10 feet on uneven surface  activity   Assist Walk 10 feet on uneven surfaces activity did not occur:  Safety/medical concerns(Per report)         Wheelchair     Assist Will patient use wheelchair at discharge?: Yes Type of Wheelchair: Manual    Wheelchair assist level: Supervision/Verbal cueing Max wheelchair distance: 100'    Wheelchair 50 feet with 2 turns  activity    Assist        Assist Level: Supervision/Verbal cueing   Wheelchair 150 feet activity     Assist Wheelchair 150 feet activity did not occur: Safety/medical concerns(decreased strength/activity tolerance)        Medical Problem List and Plan: 1.Left-sided weaknesssecondary to small acute posterior right frontal infarction  Continue CIR  Fluoxetine started on 5/31 2. Antithrombotics: -DVT/anticoagulation:Chronic Coumadin  INR therapeutic on 5/31 -antiplatelet therapy: N/A 3. Pain Management:Tylenol as needed 4. Mood:Recent diagnosis of dementia   Fluoxetine started on 5/31 -antipsychotic agents: N/A 5. Neuropsych: This patientis?  Fully capable of making decisions on hisown behalf. 6. Skin/Wound Care:Routine skin checks 7. Fluids/Electrolytes/Nutrition:Routine in and outs 8.Atrial fibrillation. Cardiac rate controlled. Continue Coumadin. Vitals:   10/28/18 1940 10/29/18 0606  BP: 120/73 (!) 144/69  Pulse: 82 78  Resp: 16 18  Temp: 98.1 F (36.7 C) 97.8 F (36.6 C)  SpO2: 98% 95%   Level on 5/31 9.Diabetes mellitus with peripheral neuropathy. Check blood sugars before meals and at bedtime CBG (last 3)  Recent Labs    10/28/18 1631 10/28/18 2147 10/29/18 0635  GLUCAP 184* 177* 150*   Lantus increased to 35 nightly on 5/30  Remains elevated on 5/31, will consider further adjustments if necessary 10.Chronic diastolic congestive heart failure. Monitor for any signs of fluid overload. Lasix 80 mg daily.  Currently without lower extremity edema  Daily weights ordered Filed Weights   10/25/18 0440 10/26/18 0333 10/27/18 0557  Weight: 109.2 kg 108.6 kg 107.1 kg   Improving on 5/29 11. Hyperlipidemia. Lipitor 12. History of tobacco use. Provide counseling 13. OSA. Continue CPAP 14.Chronic renal insufficiency with baseline creatinine 1.83-1.97.  Creatinine 1.73 on 5/29, labs ordered for  tomorrow 15. Question alcohol use. Monitor for any signs of withdrawal and provide counseling 16.  Hypokalemia likely due to Lasix will supplement KCl  Potassium 3.5 on 5/29, labs ordered for tomorrow  LOS: 5 days A FACE TO FACE EVALUATION WAS PERFORMED  Dennis Williams Lorie Phenix 10/29/2018, 10:27 AM

## 2018-10-29 NOTE — Progress Notes (Signed)
[place] for Coumadin Indication: atrial fibrillation  Allergies  Allergen Reactions  . Dilaudid [Hydromorphone Hcl] Nausea And Vomiting and Other (See Comments)    Felt hot  . No Known Allergies    Patient Measurements: Height: 5\' 8"  (172.7 cm) Weight: 236 lb 1.8 oz (107.1 kg) IBW/kg (Calculated) : 68.4 Vital Signs: Temp: 97.8 F (36.6 C) (05/31 0606) Temp Source: Oral (05/31 0606) BP: 144/69 (05/31 0606) Pulse Rate: 78 (05/31 0606)  Labs: Recent Labs    10/27/18 0506 10/28/18 0718 10/29/18 0715  LABPROT 30.1* 27.2* 27.3*  INR 2.9* 2.6* 2.6*  CREATININE 1.73*  --   --    Estimated Creatinine Clearance: 39.1 mL/min (A) (by C-G formula based on SCr of 1.73 mg/dL (H)).    Assessment: 85 y.o. male admitted with new onset weakness, possible CVA, h/o Afib, to continue Coumadin.  INR 3.6 in office 5/19, Coumadin not given 5/19-5/21.  Previously taking Coumadin 6 mg daily, but dose reduced to 5.5 mg daily starting 5/22.    INR today = 2.6  Goal of Therapy:  INR 2-3 Monitor platelets by anticoagulation protocol: Yes    Plan:  Warfarin 5 mg po daily Daily INR  Thank you Anette Guarneri, PharmD Clinical Pharmacist 10/29/2018,7:57 AM

## 2018-10-29 NOTE — Plan of Care (Signed)
Rev plan of care

## 2018-10-30 ENCOUNTER — Inpatient Hospital Stay (HOSPITAL_COMMUNITY): Payer: Medicare Other | Admitting: Physical Therapy

## 2018-10-30 ENCOUNTER — Inpatient Hospital Stay (HOSPITAL_COMMUNITY): Payer: Medicare Other

## 2018-10-30 ENCOUNTER — Inpatient Hospital Stay (HOSPITAL_COMMUNITY): Payer: Medicare Other | Admitting: Occupational Therapy

## 2018-10-30 LAB — GLUCOSE, CAPILLARY
Glucose-Capillary: 122 mg/dL — ABNORMAL HIGH (ref 70–99)
Glucose-Capillary: 157 mg/dL — ABNORMAL HIGH (ref 70–99)
Glucose-Capillary: 116 mg/dL — ABNORMAL HIGH (ref 70–99)
Glucose-Capillary: 129 mg/dL — ABNORMAL HIGH (ref 70–99)

## 2018-10-30 LAB — BASIC METABOLIC PANEL
Anion gap: 11 (ref 5–15)
BUN: 42 mg/dL — ABNORMAL HIGH (ref 8–23)
CO2: 26 mmol/L (ref 22–32)
Calcium: 9.4 mg/dL (ref 8.9–10.3)
Chloride: 103 mmol/L (ref 98–111)
Creatinine, Ser: 2.22 mg/dL — ABNORMAL HIGH (ref 0.61–1.24)
GFR calc Af Amer: 31 mL/min — ABNORMAL LOW (ref >60)
GFR calc non Af Amer: 27 mL/min — ABNORMAL LOW (ref >60)
Glucose, Bld: 152 mg/dL — ABNORMAL HIGH (ref 70–99)
Potassium: 4.2 mmol/L (ref 3.5–5.1)
Sodium: 140 mmol/L (ref 135–145)

## 2018-10-30 LAB — PROTIME-INR
INR: 2.6 — ABNORMAL HIGH (ref 0.8–1.2)
Prothrombin Time: 27.3 seconds — ABNORMAL HIGH (ref 11.4–15.2)

## 2018-10-30 MED ORDER — MIRTAZAPINE 15 MG PO TABS
7.5000 mg | ORAL_TABLET | Freq: Every day | ORAL | Status: DC
  Administered 2018-10-30 – 2018-11-02 (×4): 7.5 mg via ORAL
  Filled 2018-10-30 (×5): qty 1

## 2018-10-30 NOTE — Progress Notes (Signed)
[place] PHYSICAL MEDICINE & REHABILITATION PROGRESS NOTE   Subjective/Complaints:   No issues overnite except did not sleep well. Pt denies issues with pain, no breathing problems, no bowel or bladder issues  Review of systems: Denies CP, SOB, N/V/D  Objective:   No results found. No results for input(s): WBC, HGB, HCT, PLT in the last 72 hours. No results for input(s): NA, K, CL, CO2, GLUCOSE, BUN, CREATININE, CALCIUM in the last 72 hours.  Intake/Output Summary (Last 24 hours) at 10/30/2018 0900 Last data filed at 10/30/2018 0837 Gross per 24 hour  Intake 360 ml  Output 400 ml  Net -40 ml     Physical Exam: Vital Signs Blood pressure (!) 141/99, pulse 68, temperature 97.9 F (36.6 C), temperature source Oral, resp. rate 17, height 5\' 8"  (1.727 m), weight 107.1 kg, SpO2 96 %.   Constitutional: No distress . Vital signs reviewed. HENT: Normocephalic.  Atraumatic Eyes: EOMI.  No discharge. Cardiovascular: No JVD. Respiratory: Normal effort. GI: Non-distended. Musc: No edema or tenderness in extremities. Skin: Vascular changes bilateral lower extremities Neurologic: Alert Motor: RUE/RLE: 4+/5 proximal distal, stable LUE/LLE: 4/5 proximal distal Psych: Very flat.  Assessment/Plan: 1. Functional deficits secondary to right frontal infarct with left hemiparesis which require 3+ hours per day of interdisciplinary therapy in a comprehensive inpatient rehab setting.  Physiatrist is providing close team supervision and 24 hour management of active medical problems listed below.  Physiatrist and rehab team continue to assess barriers to discharge/monitor patient progress toward functional and medical goals  Care Tool:  Bathing  Bathing activity did not occur: Refused Body parts bathed by patient: Right arm, Left arm, Chest, Face, Right lower leg, Left lower leg, Left upper leg, Right upper leg, Abdomen     Body parts n/a: Front perineal area, Buttocks(pt completed  earlier with nursing per her report)   Bathing assist Assist Level: Moderate Assistance - Patient 50 - 74%     Upper Body Dressing/Undressing Upper body dressing   What is the patient wearing?: Button up shirt    Upper body assist Assist Level: Minimal Assistance - Patient > 75%    Lower Body Dressing/Undressing Lower body dressing    Lower body dressing activity did not occur: N/A What is the patient wearing?: Incontinence brief, Pants     Lower body assist Assist for lower body dressing: Moderate Assistance - Patient 50 - 74%     Toileting Toileting    Toileting assist Assist for toileting: Minimal Assistance - Patient > 75%     Transfers Chair/bed transfer  Transfers assist  Chair/bed transfer activity did not occur: N/A  Chair/bed transfer assist level: Minimal Assistance - Patient > 75%     Locomotion Ambulation   Ambulation assist      Assist level: Supervision/Verbal cueing Assistive device: Walker-rolling Max distance: 150   Walk 10 feet activity   Assist     Assist level: Supervision/Verbal cueing Assistive device: Walker-rolling   Walk 50 feet activity   Assist Walk 50 feet with 2 turns activity did not occur: Safety/medical concerns(decreased strength/activity tolerance)  Assist level: Supervision/Verbal cueing Assistive device: Walker-rolling    Walk 150 feet activity   Assist Walk 150 feet activity did not occur: Safety/medical concerns(decreased strength/activity tolerance)  Assist level: Contact Guard/Touching assist Assistive device: Walker-rolling    Walk 10 feet on uneven surface  activity   Assist Walk 10 feet on uneven surfaces activity did not occur: Safety/medical concerns(Per report)  Wheelchair     Assist Will patient use wheelchair at discharge?: Yes Type of Wheelchair: Manual    Wheelchair assist level: Supervision/Verbal cueing Max wheelchair distance: 100'    Wheelchair 50 feet with 2  turns activity    Assist        Assist Level: Supervision/Verbal cueing   Wheelchair 150 feet activity     Assist Wheelchair 150 feet activity did not occur: Safety/medical concerns(decreased strength/activity tolerance)        Medical Problem List and Plan: 1.Left-sided weaknesssecondary to small acute posterior right frontal infarction  Continue CIR PT, OT, SLP  Fluoxetine started on 5/31- will chage to remeron due to insomnia 2. Antithrombotics: -DVT/anticoagulation:Chronic Coumadin  INR therapeutic on 5/31, 2.6 pharmacy protocol  -antiplatelet therapy: N/A 3. Pain Management:Tylenol as needed 4. Mood:Recent diagnosis of dementia   Fluoxetine started on 5/31 -antipsychotic agents: N/A 5. Neuropsych: This patientis?  Fully capable of making decisions on hisown behalf. 6. Skin/Wound Care:Routine skin checks 7. Fluids/Electrolytes/Nutrition:Routine in and outs 8.Atrial fibrillation. Cardiac rate controlled. Continue Coumadin. Vitals:   10/29/18 1918 10/30/18 0410  BP: 130/86 (!) 141/99  Pulse: 89 68  Resp: 18 17  Temp: 98 F (36.7 C) 97.9 F (36.6 C)  SpO2: 97% 96%  mildly elevated 6/1 - monitor for trend 9.Diabetes mellitus with peripheral neuropathy. Check blood sugars before meals and at bedtime CBG (last 3)  Recent Labs    10/29/18 1624 10/29/18 2054 10/30/18 0610  GLUCAP 165* 145* 122*   Lantus increased to 35 nightly on 5/30  Improving 6/1 10.Chronic diastolic congestive heart failure. Monitor for any signs of fluid overload. Lasix 80 mg daily.  Currently without lower extremity edema  Daily weights ordered Filed Weights   10/25/18 0440 10/26/18 0333 10/27/18 0557  Weight: 109.2 kg 108.6 kg 107.1 kg    11. Hyperlipidemia. Lipitor 12. History of tobacco use. Provide counseling 13. OSA. Continue CPAP 14.Chronic renal insufficiency with baseline creatinine 1.83-1.97.  Creatinine 1.73 on 5/29,  labs ordered for 6/1 15. Question alcohol use. No signs of withdrawal    16.  Hypokalemia likely due to Lasix will supplement KCl  Potassium 3.5 on 5/29, labs ordered for 6/1  LOS: 6 days A FACE TO FACE EVALUATION WAS PERFORMED  Charlett Blake 10/30/2018, 9:00 AM

## 2018-10-30 NOTE — Progress Notes (Signed)
Speech Language Pathology Daily Session Note  Patient Details  Name: Dennis Williams MRN: [id number] Date of Birth: [birth year]  Today's Date: 10/30/2018 SLP Individual Time: 1100-1156 SLP Individual Time Calculation (min): 56 min  Short Term Goals: Week 1: SLP Short Term Goal 1 (Week 1): Pt will recall basic daily information with Mod A verbal cues for use of external aids. SLP Short Term Goal 1 - Progress (Week 1): Progressing toward goal SLP Short Term Goal 2 (Week 1): Pt will consistently demonstrate Ox4 with min A verbal cues SLP Short Term Goal 2 - Progress (Week 1): Progressing toward goal SLP Short Term Goal 3 (Week 1): Pt will complete basic functional problem solving/thought organization/reasoning for basic and familiar tasks with Mod A SLP Short Term Goal 3 - Progress (Week 1): Partly met  Skilled Therapeutic Interventions: Skilled ST services focused on cognitive skills. SLP facilitated basic problem solving with money management task, pt required mod a verbal cues for counting change and demonstrating requested amount, while total A for making change/mental manipulation. SLP facilitated basic problem solving skills with card task played at simplest level, pt required min A cues for problem solving and mod A verbal cues for recall. Pt expressed feeling mentally "foggy" and SLP passed along concerned to pt's nurse. Pt was orientated to situation, place and requiring cuing for date/day only. Pt continued to present as flat affect and limited spontaneous verbal output. Pt was left in room with call bell within reach and chair alarm set. ST recommends to continue skilled ST services.      Pain Pain Assessment Pain Scale: 0-10 Pain Score: 0-No pain  Therapy/Group: Individual Therapy  Marque Bango 10/30/2018, 8:00 AM

## 2018-10-30 NOTE — Progress Notes (Signed)
Occupational Therapy Session Note  Patient Details  Name: Dennis Williams MRN: [id number] Date of Birth: [birth year]  Today's Date: 10/30/2018 OT Individual Time: 4098-1191 OT Individual Time Calculation (min): 73 min   Short Term Goals: Week 1:  OT Short Term Goal 1 (Week 1): STGs equal to LTGs set at supervision level overall.   Skilled Therapeutic Interventions/Progress Updates:    Pt greeted in bed with no c/o pain. With encouragement, he was agreeable to take a shower. Pt completed toileting (sit<stand using standard toilet, continent bladder void), bathing (shower level, sit<stand), dressing (sit<stand from toilet using device), and oral care/grooming tasks (standing at sink) during session. All functional transfers completed with steady assist using RW at ambulatory level. Steady assist for standing balance during toileting tasks, bathing, and grooming tasks. He often requires vcs for sequencing, sitting vs standing to safely complete dressing tasks, and remembering what he is doing. Pt also swears at himself and says "I can't do this" but is able to complete ADL routine with Min A for donning gripper socks. He refused for OT to don Teds. Issued him with w/c gloves to increase ease of self propulsion, and practiced using them in room, however pt reports not liking them because his hands slip off the rims. We also called spouse on the telephone to bring in extra clothes, especially longer pants. Pt perseverated on shorts being too short. At end of session pt was left in w/c with all needs within reach and safety belt fastened. Tx focus placed in functional ambulation, dynamic balance, activity tolerance, praxis, and ADL retraining.      Therapy Documentation Precautions:  Precautions Precautions: Fall Restrictions Weight Bearing Restrictions: No Vital Signs:  Pain: Min c/o back pain at end of session. Pt declined for OT to ask RN for medicinal interventions, or provide hot/cold modalities  for relief    ADL: ADL Eating: Supervision/safety Where Assessed-Eating: Wheelchair Grooming: Supervision/safety Where Assessed-Grooming: Wheelchair Upper Body Bathing: Supervision/safety Where Assessed-Upper Body Bathing: Wheelchair Lower Body Bathing: Moderate assistance Where Assessed-Lower Body Bathing: Wheelchair Upper Body Dressing: Moderate assistance Where Assessed-Upper Body Dressing: Wheelchair Lower Body Dressing: Moderate assistance Where Assessed-Lower Body Dressing: Wheelchair Toileting: Moderate assistance Where Assessed-Toileting: Glass blower/designer: Moderate assistance Toilet Transfer Method: Counselling psychologist: Raised toilet seat, Grab bars     Therapy/Group: Individual Therapy  [name] 10/30/2018, 11:57 AM

## 2018-10-30 NOTE — Progress Notes (Signed)
Physical Therapy Session Note  Patient Details  Name: Dennis Williams MRN: [id number] Date of Birth: 05/29/1934  Today's Date: 10/30/2018 PT Individual Time: 1305-1420 PT Individual Time Calculation (min): 75 min   Short Term Goals: Week 1:  PT Short Term Goal 1 (Week 1): STG=LTG due to ELOS.  Skilled Therapeutic Interventions/Progress Updates: Pt presented in w/c agreeable to therapy. Pt performed ambulatory transfer to bathroom with RW and CGA for ambulation. Performed toilet transfers with CGA and pt was able to perform clothing management with close S using BUE (+void). Pt then ambulated to around nsg station and through day room with RW and close S. Pt noted to have heavy grip with BUE with PTA providing cues to loosen grip and increase erect posture. Pt then transported to rehab gym and performed gait activities with RW including obstacle course weaving through cones and stepping over thresholds. Pt then participated in toe taps to level target with RW to encourage step length, pt self limiting in activity repeating "I don't trust my legs" with PTA providing encouragement to participate in activity to improve balance. With encouragement pt was able to tolerate brief bouts standing without AD. Pt also participated in pipe tree no AD requiring x 2 seated rest breaks and min cues for use of correct size pipes. Participated in 4in step overs in parallel bars with CGA x 4. Also participated in side stepping in parallel bars with tactile cues for hip alignment. Pt ambulated back to room close S and returned to w/c at end of session. Pt left with belt alarm on, call bell within reach and needs met.      Therapy Documentation Precautions:  Precautions Precautions: Fall Restrictions Weight Bearing Restrictions: No General:   Vital Signs: Therapy Vitals Temp: 98 F (36.7 C) Temp Source: Oral Pulse Rate: 74 Resp: 20 BP: 133/72 Patient Position (if appropriate): Sitting Oxygen  Therapy SpO2: 98 % O2 Device: Room Air Pain: Pain Assessment Pain Score: 0-No pain    Therapy/Group: Individual Therapy  Glanda Spanbauer  Riggin Cuttino, PTA  10/30/2018, 3:54 PM

## 2018-10-30 NOTE — Progress Notes (Addendum)
[place] for Warfarin Indication: atrial fibrillation  Allergies  Allergen Reactions  . Dilaudid [Hydromorphone Hcl] Nausea And Vomiting and Other (See Comments)    Felt hot  . No Known Allergies    Patient Measurements: Height: 5\' 8"  (172.7 cm) Weight: 236 lb 1.8 oz (107.1 kg) IBW/kg (Calculated) : 68.4 Vital Signs: Temp: 98 F (36.7 C) (06/01 1419) Temp Source: Oral (06/01 1419) BP: 133/72 (06/01 1419) Pulse Rate: 74 (06/01 1419)  Labs: Recent Labs    10/28/18 0718 10/29/18 0715 10/30/18 0819  LABPROT 27.2* 27.3* 27.3*  INR 2.6* 2.6* 2.6*  CREATININE  --   --  2.22*   Estimated Creatinine Clearance: 30.4 mL/min (A) (by C-G formula based on SCr of 2.22 mg/dL (H)).    Assessment: 85 y.o. male admitted with new onset weakness, possible CVA, h/o a-fib on chronic warfarin. INR 3.6 in office 5/19, warfarin not given 5/19-5/21. Previously taking warfarin 6 mg daily, but dose reduced to 5.5 mg daily starting 5/22. Pharmacy consulted for warfarin dosing while patient admitted.    Today, 10/30/18:  INR remains 2.6, therapeutic  CBC WNL (on 5/27)  Goal of Therapy:  INR 2-3 Monitor platelets by anticoagulation protocol: Yes    Plan:  Continue warfarin 5mg  PO daily Daily PT/INR Monitor CBC and for s/sx of bleeding   Thank you for allowing pharmacy to be a part of this patient's care.   Lindell Spar, PharmD, BCPS Clinical Pharmacist [phone] 10/30/2018,4:00 PM

## 2018-10-31 ENCOUNTER — Inpatient Hospital Stay (HOSPITAL_COMMUNITY): Payer: Medicare Other | Admitting: Physical Therapy

## 2018-10-31 ENCOUNTER — Inpatient Hospital Stay (HOSPITAL_COMMUNITY): Payer: Medicare Other | Admitting: Occupational Therapy

## 2018-10-31 ENCOUNTER — Inpatient Hospital Stay (HOSPITAL_COMMUNITY): Payer: Medicare Other

## 2018-10-31 ENCOUNTER — Inpatient Hospital Stay (HOSPITAL_COMMUNITY): Payer: Medicare Other | Admitting: Rehabilitation

## 2018-10-31 LAB — GLUCOSE, CAPILLARY
Glucose-Capillary: 83 mg/dL (ref 70–99)
Glucose-Capillary: 108 mg/dL — ABNORMAL HIGH (ref 70–99)
Glucose-Capillary: 89 mg/dL (ref 70–99)
Glucose-Capillary: 112 mg/dL — ABNORMAL HIGH (ref 70–99)

## 2018-10-31 LAB — PROTIME-INR
INR: 2.5 — ABNORMAL HIGH (ref 0.8–1.2)
Prothrombin Time: 26.4 seconds — ABNORMAL HIGH (ref 11.4–15.2)

## 2018-10-31 NOTE — Progress Notes (Signed)
Physical Therapy Session Note  Patient Details  Name: Dennis Williams MRN: [id number] Date of Birth: 15-Jun-1933  Today's Date: 10/31/2018 PT Individual Time: 1100-1155 PT Individual Time Calculation (min): 55 min   Short Term Goals: Week 1:  PT Short Term Goal 1 (Week 1): STG=LTG due to ELOS. Week 2:     Skilled Therapeutic Interventions/Progress Updates:   Pt received sitting in w/c, reluctantly agreeable to therapy today.  Pt reporting needing to use restroom prior to leaving room, therefore assisted pt via RW to/from restroom at min/guard level with cues for safety with walker negotiation and door management.  Once in restroom, did have mild LOB when managing clothing, needing min A to steady, otherwise was CGA throughout.  Pt ambulatory back to sink to wash hands, again needing CGA but cues to utilize soap and not just wet hands.  Pt with mild postural sway during hand washing, also using sink for intermittent support.  Pt then needing rest break prior to leaving room. Pt refused to ambulate from room, but self propelled w/c.  Cues for improved stride to for improved technique to improve muscle activation.  Pt ambulated 15' x 2 reps with RW at [facility] with continued cues for posture and stride as well as attending to task.  Pt was able to navigate back to room without cuing.  Again, pt refusing to ambulate more than 65' back to room, but again self propelled 85' (x 2 reps).  In therapy gym, performed sit<>stand x 5 reps.  Attempted to have pt perform forward/backward stepping with PT B UE support, however had difficulty maintaining correct position.  Pt very leary to let go of RW during standing exercises. Once back in room, pt agreeable to seated therex: LAQ's x 15 reps, seated marching x 15 reps, and B hamstring stretch x 30 secs on each side.  Pt left with seat belt alarm on, all needs in reach.   Therapy Documentation Precautions:  Precautions Precautions: Fall Restrictions Weight Bearing  Restrictions: No   Pain: Pt reports no pain during session, but constantly rubbing R knee following gait, sit<>stand.     Therapy/Group: Individual Therapy  Denice Bors 10/31/2018, 12:20 PM

## 2018-10-31 NOTE — Progress Notes (Signed)
Patient refused CPAP machine.

## 2018-10-31 NOTE — Progress Notes (Addendum)
[place] PHYSICAL MEDICINE & REHABILITATION PROGRESS NOTE   Subjective/Complaints:  Working with PT this am, sup bed mob Pt tired but no increased weakness in arms or legs  Review of systems: Denies CP, SOB, N/V/D  Objective:   No results found. No results for input(s): WBC, HGB, HCT, PLT in the last 72 hours. Recent Labs    10/30/18 0819  NA 140  K 4.2  CL 103  CO2 26  GLUCOSE 152*  BUN 42*  CREATININE 2.22*  CALCIUM 9.4    Intake/Output Summary (Last 24 hours) at 10/31/2018 0849 Last data filed at 10/31/2018 0743 Gross per 24 hour  Intake 360 ml  Output 800 ml  Net -440 ml     Physical Exam: Vital Signs Blood pressure (!) 146/94, pulse 77, temperature 98 F (36.7 C), temperature source Oral, resp. rate 16, height 5\' 8"  (1.727 m), weight 107.3 kg, SpO2 97 %.   Constitutional: No distress . Vital signs reviewed. HENT: Normocephalic.  Atraumatic Eyes: EOMI.  No discharge. Cardiovascular: No JVD. Respiratory: Normal effort. GI: Non-distended. Musc: No edema or tenderness in extremities. Skin: Vascular changes bilateral lower extremities Neurologic: Alert Motor: RUE/RLE: 4+/5 proximal distal, stable LUE/LLE: 4/5 proximal distal Psych: Very flat.  Assessment/Plan: 1. Functional deficits secondary to right frontal infarct with left hemiparesis which require 3+ hours per day of interdisciplinary therapy in a comprehensive inpatient rehab setting.  Physiatrist is providing close team supervision and 24 hour management of active medical problems listed below.  Physiatrist and rehab team continue to assess barriers to discharge/monitor patient progress toward functional and medical goals  Care Tool:  Bathing  Bathing activity did not occur: Refused Body parts bathed by patient: Right arm, Left arm, Chest, Face, Right lower leg, Left lower leg, Left upper leg, Right upper leg, Abdomen, Front perineal area, Buttocks     Body parts n/a: Front perineal area,  Buttocks(pt completed earlier with nursing per her report)   Bathing assist Assist Level: Contact Guard/Touching assist     Upper Body Dressing/Undressing Upper body dressing   What is the patient wearing?: Button up shirt    Upper body assist Assist Level: Supervision/Verbal cueing    Lower Body Dressing/Undressing Lower body dressing    Lower body dressing activity did not occur: N/A What is the patient wearing?: Underwear/pull up, Pants     Lower body assist Assist for lower body dressing: Contact Guard/Touching assist     Toileting Toileting    Toileting assist Assist for toileting: Contact Guard/Touching assist     Transfers Chair/bed transfer  Transfers assist  Chair/bed transfer activity did not occur: N/A  Chair/bed transfer assist level: Minimal Assistance - Patient > 75%     Locomotion Ambulation   Ambulation assist      Assist level: Supervision/Verbal cueing Assistive device: Walker-rolling Max distance: 150   Walk 10 feet activity   Assist     Assist level: Supervision/Verbal cueing Assistive device: Walker-rolling   Walk 50 feet activity   Assist Walk 50 feet with 2 turns activity did not occur: Safety/medical concerns(decreased strength/activity tolerance)  Assist level: Supervision/Verbal cueing Assistive device: Walker-rolling    Walk 150 feet activity   Assist Walk 150 feet activity did not occur: Safety/medical concerns(decreased strength/activity tolerance)  Assist level: Contact Guard/Touching assist Assistive device: Walker-rolling    Walk 10 feet on uneven surface  activity   Assist Walk 10 feet on uneven surfaces activity did not occur: Safety/medical concerns(Per report)  Wheelchair     Assist Will patient use wheelchair at discharge?: Yes Type of Wheelchair: Manual    Wheelchair assist level: Supervision/Verbal cueing Max wheelchair distance: 100'    Wheelchair 50 feet with 2 turns  activity    Assist        Assist Level: Supervision/Verbal cueing   Wheelchair 150 feet activity     Assist Wheelchair 150 feet activity did not occur: Safety/medical concerns(decreased strength/activity tolerance)        Medical Problem List and Plan: 1.Left-sided weaknesssecondary to small acute posterior right frontal infarction  Continue CIR PT, OT, SLP  Fluoxetine started on 5/31- will chage to remeron due to insomnia, ? Hangover effect will monitor Team conf in am 2. Antithrombotics: -DVT/anticoagulation:Chronic Coumadin  pharmacy protocol  -antiplatelet therapy: N/A 3. Pain Management:Tylenol as needed 4. Mood:Recent diagnosis of dementia   Fluoxetine started on 5/31 -antipsychotic agents: N/A 5. Neuropsych: This patientis?  Fully capable of making decisions on hisown behalf. 6. Skin/Wound Care:Routine skin checks 7. Fluids/Electrolytes/Nutrition:Routine in and outs 8.Atrial fibrillation. Cardiac rate controlled. Continue Coumadin. Vitals:   10/30/18 1936 10/31/18 0528  BP: 132/81 (!) 146/94  Pulse: (!) 57 77  Resp: 15 16  Temp: 97.7 F (36.5 C) 98 F (36.7 C)  SpO2: 98% 97%  mildly elevated 6/2 - monitor for trend ok 6/1 pm 9.Diabetes mellitus with peripheral neuropathy. Check blood sugars before meals and at bedtime CBG (last 3)  Recent Labs    10/30/18 1645 10/30/18 2116 10/31/18 0613  GLUCAP 116* 129* 83   Lantus increased to 35 nightly on 5/30  Improving 6/1 10.Chronic diastolic congestive heart failure. Monitor for any signs of fluid overload. Lasix 80 mg daily.  Currently without lower extremity edema  Daily weights ordered Filed Weights   10/26/18 0333 10/27/18 0557 10/31/18 0528  Weight: 108.6 kg 107.1 kg 107.3 kg    11. Hyperlipidemia. Lipitor 12. History of tobacco use. Provide counseling 13. OSA. Continue CPAP 14.Chronic renal insufficiency with baseline creatinine  1.83-1.97.  Creatinine 1.73 on 5/29, labs ordered for 6/1, creat 2.22 elevated over baseline , reduce lasix intake <597ml per day 15. Question alcohol use. No signs of withdrawal    16.  Hypokalemia likely due to Lasix will supplement KCl  Potassium 3.5 on 5/29, labs ordered for 6/1 show improvement   LOS: 7 days A FACE TO FACE EVALUATION WAS PERFORMED  Charlett Blake 10/31/2018, 8:49 AM

## 2018-10-31 NOTE — Progress Notes (Signed)
Physical Therapy Session Note  Patient Details  Name: Dennis Williams MRN: [id number] Date of Birth: 1933/11/06  Today's Date: 10/31/2018 PT Individual Time: 7017-7939 PT Individual Time Calculation (min): 53 min   Short Term Goals: Week 1:  PT Short Term Goal 1 (Week 1): STG=LTG due to ELOS.  Skilled Therapeutic Interventions/Progress Updates: Pt presented in bed agreeable to therapy with encouragement. Pt indicating "I don't feel right" this session however unable to elaborate. Pt initially refusing to sit at EOB then performed supine to sit at EOB with increased time CGA and use of bed rail. Upon sitting EOB pt c/o pain in BLE however unable to describe even when asked specifically if ache, burning, stabbing, etc. Pt then noted to have tremors in RLE which pt stated was not moving purposefully. Pt then returned to bed CGA with all no movement. MD then arrived to performed assessment pt performed bed mobility CGA with no tremors. MD indicated some dehydration due to decreased fluid intake may be cause for pt's current status. Once MD left pt agreeable to attempt ambulation with extensive encouragement. Pt required minA from bed x 2 attempts due to posterior lean with LOB on initial attempt. Once pt in standing pt ambulating approx 69ft with RW and CGA with forward flexed posture and shortened step length. Pt returned to w/c then requesting to return to bed. PTA encouraged pt to stay in w/c due to ST session dovetailing PT session. Pt then asked if could ambulate again. Pt performed STS from w/c CGA and ambulated approx 87ft then noted R knee instability with pt expressing increased pain. After brief standing rest pt able to ambulate backwards to w/c. Pt remained in w/c and handed of to SLP for following session.      Therapy Documentation Precautions:  Precautions Precautions: Fall Restrictions Weight Bearing Restrictions: No General:   Vital Signs:  Pain: Pain Assessment Pain Score:  0-No pain   Therapy/Group: Individual Therapy  Dimitry Holsworth  Lillianah Swartzentruber, PTA  10/31/2018, 12:38 PM

## 2018-10-31 NOTE — Progress Notes (Signed)
[place] for Warfarin Indication: atrial fibrillation  Allergies  Allergen Reactions  . Dilaudid [Hydromorphone Hcl] Nausea And Vomiting and Other (See Comments)    Felt hot  . No Known Allergies    Patient Measurements: Height: 5\' 8"  (172.7 cm) Weight: 236 lb 9.6 oz (107.3 kg) IBW/kg (Calculated) : 68.4  Vital Signs: Temp: 98 F (36.7 C) (06/02 1417) Temp Source: Oral (06/02 1417) BP: 116/73 (06/02 1417) Pulse Rate: 97 (06/02 1417)  Labs: Recent Labs    10/29/18 0715 10/30/18 0819 10/31/18 0701  LABPROT 27.3* 27.3* 26.4*  INR 2.6* 2.6* 2.5*  CREATININE  --  2.22*  --    Estimated Creatinine Clearance: 30.5 mL/min (A) (by C-G formula based on SCr of 2.22 mg/dL (H)).    Assessment: 85 y.o. male admitted with new onset weakness, possible CVA, h/o a-fib on chronic warfarin. INR 3.6 in office 5/19, warfarin not given 5/19-5/21. Previously taking warfarin 6 mg daily, but dose reduced to 5.5 mg daily starting 5/22. Pharmacy consulted for warfarin dosing while patient admitted.    Today, 10/31/18:  INR 2.5, remains therapeutic  CBC WNL (on 5/27)  Goal of Therapy:  INR 2-3 Monitor platelets by anticoagulation protocol: Yes    Plan:  Continue warfarin 5mg  PO daily Change PT/INR from daily to M/W/F CBC in AM Monitor for s/sx of bleeding   Thank you for allowing pharmacy to be a part of this patient's care.   Lindell Spar, PharmD, BCPS Clinical Pharmacist [phone] 10/31/2018,3:01 PM

## 2018-10-31 NOTE — Progress Notes (Signed)
Physical Therapy Session Note  Patient Details  Name: Dennis Williams MRN: [id number] Date of Birth: [birth year]  Today's Date: 10/31/2018 PT Individual Time: 1415-1445 PT Individual Time Calculation (min): 30 min   Short Term Goals: Week 1:  PT Short Term Goal 1 (Week 1): STG=LTG due to ELOS.  Skilled Therapeutic Interventions/Progress Updates:   Pt received sitting in w/c in room, reluctantly agreeable to therapy.  Pt able to ambulate from room to day room today x 150' with RW at min/guard level with more intermittent cues for posture and improved stride length.  Once in day room, participated bean bag toss (corn hole) for standing balance.  Pt initially tossed in seated position and then encouraged to stand.  Pt able to stand x 2-3 mins to toss and then retreive bags all with use of RW and CGA.  Pt needing less seated rest breaks for pm session and remained standing longer as well as ambulated longer distances.  Pt then ambulated x 150' to therapy gym, took seated rest break, then performed 8, 3" steps with B rails.  Cues for posture and safety with descent to keep UEs forward of body.  Pt able to then continue to ambulate x 85' before sitting and "walking" w/c with BLEs to room.  Pt with less fatigue during session, but does demonstrate irritability when asked to do more.  Pt left in w/c with seat belt alarm set and all needs in reach.   Therapy Documentation Precautions:  Precautions Precautions: Fall Restrictions Weight Bearing Restrictions: No   Vital Signs: Therapy Vitals Temp: 98 F (36.7 C) Temp Source: Oral Pulse Rate: 97 Resp: 18 BP: 116/73 Patient Position (if appropriate): Sitting Oxygen Therapy SpO2: 98 % O2 Device: Room Air Pain: Pain Assessment Pain Score: 0-No pain    Therapy/Group: Individual Therapy  Denice Bors 10/31/2018, 3:12 PM

## 2018-10-31 NOTE — Progress Notes (Signed)
Speech Language Pathology Daily Session Note  Patient Details  Name: Dennis Williams MRN: [id number] Date of Birth: [birth year]  Today's Date: 10/31/2018 SLP Individual Time: 0867-6195 SLP Individual Time Calculation (min): 40 min  Short Term Goals: Week 1: SLP Short Term Goal 1 (Week 1): Pt will recall basic daily information with Mod A verbal cues for use of external aids. SLP Short Term Goal 1 - Progress (Week 1): Progressing toward goal SLP Short Term Goal 2 (Week 1): Pt will consistently demonstrate Ox4 with min A verbal cues SLP Short Term Goal 2 - Progress (Week 1): Progressing toward goal SLP Short Term Goal 3 (Week 1): Pt will complete basic functional problem solving/thought organization/reasoning for basic and familiar tasks with Mod A SLP Short Term Goal 3 - Progress (Week 1): Partly met  Skilled Therapeutic Interventions:Skilled ST services focused on cognitive skills. PT communicated with ST pt was demonstrating anxious behavior, stating he "felt different" and requesting her not to leave the room. Pt appeared very anxious, attempting to move out of WC, unlocking/locking brakes on WC, moving back/forth and grabbing/squeeze water bottle requiring physical assisted to unlock grip on bottle.Upon questioning pt denied anxious feelings or alternated state, but refused to leave room with ST or allow ST to leave room. SLP notfied nurse and nurse tech, vitals were taken heart rate 101 and BP 137/91. SLP communicated about recent DC of anxiety medication, nurse noted will follow up with PA. Pt was orientated to situation, place and time with min A verbal cues for visual aid posted in room. Pt was unable to recall yesterday's card task or rules with max A verbal cues. SLP facilitated short term recall of novel information following listening to short paragraphs, pt recalled 2 out 4 facts with max A verbal cues with 1 minute delay and following instruction recalled 3 out 5 with max A verbal cues.  Pt was left in room with call bell within reach and chair alarm set. ST recommends to continue skilled ST services.      Pain Pain Assessment Pain Score: 0-No pain  Therapy/Group: Individual Therapy  Clovis Warwick  [facility] 10/31/2018, 12:36 PM

## 2018-10-31 NOTE — Plan of Care (Signed)
  Problem: Consults Goal: RH STROKE PATIENT EDUCATION Description See Patient Education module for education specifics  Outcome: Progressing Goal: Diabetes Guidelines if Diabetic/Glucose > 140 Description If diabetic or lab glucose is > 140 mg/dl - Initiate Diabetes/Hyperglycemia Guidelines & Document Interventions  Outcome: Progressing   Problem: RH BOWEL ELIMINATION Goal: RH STG MANAGE BOWEL WITH ASSISTANCE Description STG Manage Bowel with min Assistance.  Outcome: Progressing   Problem: RH BLADDER ELIMINATION Goal: RH STG MANAGE BLADDER WITH ASSISTANCE Description STG Manage Bladder With min Assistance  Outcome: Progressing   Problem: RH SKIN INTEGRITY Goal: RH STG SKIN FREE OF INFECTION/BREAKDOWN Description Pt will be free of skin breakdown and ways to prevent during hospital stay with min assist  Outcome: Progressing Goal: RH STG MAINTAIN SKIN INTEGRITY WITH ASSISTANCE Description STG Maintain Skin Integrity With min Assistance.  Outcome: Progressing   Problem: RH SAFETY Goal: RH STG ADHERE TO SAFETY PRECAUTIONS W/ASSISTANCE/DEVICE Description STG Adhere to Safety Precautions With min Assistance/Device.  Outcome: Progressing   Problem: RH PAIN MANAGEMENT Goal: RH STG PAIN MANAGED AT OR BELOW PT'S PAIN GOAL Description Less than 3 on 0-10 scale  Outcome: Progressing   Problem: RH KNOWLEDGE DEFICIT Goal: RH STG INCREASE KNOWLEDGE OF DIABETES Description Patient will be able name 2 ways to manage hyperglycemia using handouts independently  Outcome: Progressing Goal: RH STG INCREASE KNOWLEDGE OF HYPERTENSION Description Patient will verbalize 2 ways to decrease BP and medication side effects prior to discharge independently with handouts  Outcome: Progressing Goal: RH STG INCREASE KNOWLEGDE OF HYPERLIPIDEMIA Description Pt will be able to verbalize medication and side effects that are related independently prior to discharge  Outcome: Progressing Goal: RH  STG INCREASE KNOWLEDGE OF STROKE PROPHYLAXIS Description Pt will be able to explain stroke prophylaxis and medications to prevent secondary stroke using handouts independently   Outcome: Progressing

## 2018-10-31 NOTE — Progress Notes (Signed)
Occupational Therapy Session Note  Patient Details  Name: Dennis Williams MRN: [id number] Date of Birth: [birth year]  Today's Date: 10/31/2018 OT Individual Time: 2518-9842 OT Individual Time Calculation (min): 27 min    Short Term Goals: Week 1:  OT Short Term Goal 1 (Week 1): STGs equal to LTGs set at supervision level overall.   Skilled Therapeutic Interventions/Progress Updates:    Upon entering the room, pt seated in wheelchair finishing lunch. Pt declining OT intervention and requesting to eat. OT discussed discharge plans for this week with pt making no comment towards discussion. Pt declined addressing LB dressing this session and reports, " Wife will help." Pt standing from wheelchair with min guard and ambulating 15' into bathroom with supervision. Pt seated on toilet for toileting task with CGA for safety and able to complete hygiene and clothing management with steady assistance for balance. Pt returning to chair at end of session with chair alarm belt donned and activated. Call bell and all needed items within reach upon exiting the room.   Therapy Documentation Precautions:  Precautions Precautions: Fall Restrictions Weight Bearing Restrictions: No General:   Vital Signs: Therapy Vitals Temp: 98 F (36.7 C) Temp Source: Oral Pulse Rate: 97 Resp: 18 BP: 116/73 Patient Position (if appropriate): Sitting Oxygen Therapy SpO2: 98 % O2 Device: Room Air Pain: Pain Assessment Pain Score: 0-No pain ADL: ADL Eating: Supervision/safety Where Assessed-Eating: Wheelchair Grooming: Supervision/safety Where Assessed-Grooming: Wheelchair Upper Body Bathing: Supervision/safety Where Assessed-Upper Body Bathing: Wheelchair Lower Body Bathing: Moderate assistance Where Assessed-Lower Body Bathing: Wheelchair Upper Body Dressing: Moderate assistance Where Assessed-Upper Body Dressing: Wheelchair Lower Body Dressing: Moderate assistance Where Assessed-Lower Body Dressing:  Wheelchair Toileting: Moderate assistance Where Assessed-Toileting: Glass blower/designer: Moderate assistance Toilet Transfer Method: Counselling psychologist: Raised toilet seat, Grab bars   Therapy/Group: Individual Therapy  Gypsy Decant 10/31/2018, 3:39 PM

## 2018-11-01 ENCOUNTER — Inpatient Hospital Stay (HOSPITAL_COMMUNITY): Payer: Medicare Other | Admitting: Occupational Therapy

## 2018-11-01 ENCOUNTER — Inpatient Hospital Stay (HOSPITAL_COMMUNITY): Payer: Medicare Other | Admitting: Speech Pathology

## 2018-11-01 ENCOUNTER — Inpatient Hospital Stay (HOSPITAL_COMMUNITY): Payer: Medicare Other | Admitting: Physical Therapy

## 2018-11-01 ENCOUNTER — Encounter (HOSPITAL_COMMUNITY): Payer: Medicare Other | Admitting: Psychology

## 2018-11-01 DIAGNOSIS — F0391 Unspecified dementia with behavioral disturbance: Secondary | ICD-10-CM

## 2018-11-01 DIAGNOSIS — F03918 Unspecified dementia, unspecified severity, with other behavioral disturbance: Secondary | ICD-10-CM

## 2018-11-01 DIAGNOSIS — F0390 Unspecified dementia without behavioral disturbance: Secondary | ICD-10-CM

## 2018-11-01 LAB — CBC
HCT: 48 % (ref 39.0–52.0)
Hemoglobin: 16.1 g/dL (ref 13.0–17.0)
MCH: 30.6 pg (ref 26.0–34.0)
MCHC: 33.5 g/dL (ref 30.0–36.0)
MCV: 91.1 fL (ref 80.0–100.0)
Platelets: 211 10*3/uL (ref 150–400)
RBC: 5.27 MIL/uL (ref 4.22–5.81)
RDW: 12.6 % (ref 11.5–15.5)
WBC: 7.4 10*3/uL (ref 4.0–10.5)
nRBC: 0 % (ref 0.0–0.2)

## 2018-11-01 LAB — GLUCOSE, CAPILLARY
Glucose-Capillary: 65 mg/dL — ABNORMAL LOW (ref 70–99)
Glucose-Capillary: 80 mg/dL (ref 70–99)
Glucose-Capillary: 121 mg/dL — ABNORMAL HIGH (ref 70–99)
Glucose-Capillary: 97 mg/dL (ref 70–99)
Glucose-Capillary: 121 mg/dL — ABNORMAL HIGH (ref 70–99)

## 2018-11-01 LAB — PROTIME-INR
INR: 2.8 — ABNORMAL HIGH (ref 0.8–1.2)
Prothrombin Time: 28.7 seconds — ABNORMAL HIGH (ref 11.4–15.2)

## 2018-11-01 MED ORDER — INSULIN GLARGINE 100 UNIT/ML ~~LOC~~ SOLN
30.0000 [IU] | Freq: Every day | SUBCUTANEOUS | Status: DC
  Administered 2018-11-01 – 2018-11-02 (×2): 30 [IU] via SUBCUTANEOUS
  Filled 2018-11-01 (×3): qty 0.3

## 2018-11-01 NOTE — Progress Notes (Signed)
[place] for Warfarin Indication: atrial fibrillation  Allergies  Allergen Reactions  . Dilaudid [Hydromorphone Hcl] Nausea And Vomiting and Other (See Comments)    Felt hot  . No Known Allergies    Patient Measurements: Height: 5\' 8"  (172.7 cm) Weight: 236 lb 5.8 oz (107.2 kg) IBW/kg (Calculated) : 68.4  Vital Signs: Temp: 98 F (36.7 C) (06/03 1427) Temp Source: Oral (06/03 1427) BP: 126/74 (06/03 1427) Pulse Rate: 103 (06/03 1427)  Labs: Recent Labs    10/30/18 0819 10/31/18 0701 11/01/18 1313  HGB  --   --  16.1  HCT  --   --  48.0  PLT  --   --  211  LABPROT 27.3* 26.4* 28.7*  INR 2.6* 2.5* 2.8*  CREATININE 2.22*  --   --    Estimated Creatinine Clearance: 30.4 mL/min (A) (by C-G formula based on SCr of 2.22 mg/dL (H)).    Assessment: 85 y.o. male admitted with new onset weakness, possible CVA, h/o a-fib on chronic warfarin. INR 3.6 in office 5/19, warfarin not given 5/19-5/21. Previously taking warfarin 6 mg daily, but dose reduced to 5.5 mg daily starting 5/22. Pharmacy consulted for warfarin dosing while patient admitted.    Today, 11/01/18:  INR 2.8, remains therapeutic  CBC WNL   Goal of Therapy:  INR 2-3 Monitor platelets by anticoagulation protocol: Yes    Plan:  Continue warfarin 5mg  PO daily PT/INR M/W/F Monitor CBC and for s/sx of bleeding   Thank you for allowing pharmacy to be a part of this patient's care.   Lindell Spar, PharmD, BCPS Clinical Pharmacist [phone] 11/01/2018,2:45 PM

## 2018-11-01 NOTE — Progress Notes (Signed)
Physical Therapy Session Note  Patient Details  Name: Dennis Williams MRN: [id number] Date of Birth: 09-09-1933  Today's Date: 11/01/2018 PT Individual Time: 0805-0930 and 1300-1330 PT Individual Time Calculation (min): 85 min and 30 min  Short Term Goals: Week 1:  PT Short Term Goal 1 (Week 1): STG=LTG due to ELOS.  Skilled Therapeutic Interventions/Progress Updates: Pt presented in bed agreeable to therapy with encouragement. Pt performed bed mobility with use of features supervision with increased time. PTA presented shoes to pt and pt was able to don with set up and increased time. PTA encouraged pt to transfer to w/c however pt verbalizing unable to and displaying some unusual behaviors such as reaching and snapping shorts. PTA eventually able to encourage pt to ambulate to bathroom. Pt performed STS with supervision and ambulated CGA to toilet with RW. Performed toilet transfer CGA and was able to reach to floor to pick up shorts while holding one hand on RW. Pt then ambulated to sink to perform hand hygiene while intermittently supporting single hand on sink. Pt transferred to w/c and "walked" w/c to elevators. Pt then ambulated remaining distance to rehab gym. Participated in gait activities with RW including weaving through cones and stepping over thresholds. Pt also participated in x 2 bouts of horseshoes with encouragement to participate in standing. Attempted to engage pt in bout of ball toss with 3rd person but pt adamantly refused. Pt ambulated back to room approx 85 ft then stopped impulsively with no explanation. W/c provided and pt w/c "walked back to room. Pt then moving to sink and performed brushed teeth at w/c level with set up, and combed hair mod I. Pt and PTA went back and forth for approx 10 min between sitting in w/c, recliner, and bed. PTA ultimately had pt sit in recliner with pt performed ambulatory transfer with RW supervision level to recliner. Pt left in recliner with seat  alarm on, call bell within reach and needs met.    Tx2: Pt presented in recliner shaking head no in participation to therapy. Pt's lunch in front of pt with approx 2 bits taken. Pt able to verbalize needs to eat but unable to eat. Tried to offer pt alternate choices and pt nsg no restrictions as PO intake priority at moment. Pt unable to make decision regarding food choice only stating can't participate in therapy until he has eaten. Pt then stating he would eat better if in the w/c. Pt performed STS with RW and supervision and upon standing decided to use bathroom. Performed ambulatory transfer to bathroom and performed toilet transfers close S (+void). Pt was able to perform clothing management and pull up shorts from ground steadying self with hand on RW. Pt returned to w/c and switched places of recliner and w/c. Pt then independently began eating food once tray repositioned in front of him. Pt left with seat alarm on, call bell within reach and nsg notified.      Therapy Documentation Precautions:  Precautions Precautions: Fall Restrictions Weight Bearing Restrictions: No    Therapy/Group: Individual Therapy  Lurene Robley  Amberley Hamler, PTA  11/01/2018, 12:27 PM

## 2018-11-01 NOTE — Plan of Care (Signed)
  Problem: RH BOWEL ELIMINATION Goal: RH STG MANAGE BOWEL WITH ASSISTANCE Description STG Manage Bowel with min Assistance.  Outcome: Progressing   Problem: RH BLADDER ELIMINATION Goal: RH STG MANAGE BLADDER WITH ASSISTANCE Description STG Manage Bladder With min Assistance  Outcome: Progressing   Problem: RH SKIN INTEGRITY Goal: RH STG SKIN FREE OF INFECTION/BREAKDOWN Description Pt will be free of skin breakdown and ways to prevent during hospital stay with min assist  Outcome: Progressing Goal: RH STG MAINTAIN SKIN INTEGRITY WITH ASSISTANCE Description STG Maintain Skin Integrity With min Assistance.  Outcome: Progressing

## 2018-11-01 NOTE — Progress Notes (Signed)
Occupational Therapy Discharge Summary  Patient Details  Name: Dennis Williams MRN: [id number] Date of Birth: July 16, 1933   Patient has met 11 of 11 long term goals due to improved activity tolerance, improved balance, ability to compensate for deficits, improved attention and improved coordination.  Patient to discharge at overall Supervision level.  Patient's family has been providing supervision level assist prior to admission and cognitive deficits present at baseline.  Reasons goals not met: n/a  Recommendation:  Patient will benefit from ongoing skilled OT services in home health setting to continue to advance functional skills in the area of BADL and Reduce care partner burden.  Equipment: TTB  Reasons for discharge: treatment goals met  Patient/family agrees with progress made and goals achieved: Yes  OT Discharge Precautions/Restrictions  Precautions Precautions: Fall General PT Missed Treatment Reason: Other (Comment)(pt eating) Vital Signs Therapy Vitals Temp: 98 F (36.7 C) Temp Source: Oral Pulse Rate: (!) 103 Resp: 20 BP: 126/74 Patient Position (if appropriate): Sitting Oxygen Therapy SpO2: 98 % O2 Device: Room Air Pain Pain Assessment Pain Scale: 0-10 Pain Score: 0-No pain ADL ADL Eating: Supervision/safety Where Assessed-Eating: Wheelchair Grooming: Supervision/safety Where Assessed-Grooming: Wheelchair Upper Body Bathing: Supervision/safety Where Assessed-Upper Body Bathing: Wheelchair Lower Body Bathing: Moderate assistance Where Assessed-Lower Body Bathing: Wheelchair Upper Body Dressing: Moderate assistance Where Assessed-Upper Body Dressing: Wheelchair Lower Body Dressing: Moderate assistance Where Assessed-Lower Body Dressing: Wheelchair Toileting: Moderate assistance Where Assessed-Toileting: Glass blower/designer: Moderate assistance Toilet Transfer Method: Counselling psychologist: Raised toilet seat, Grab  bars Vision Baseline Vision/History: Wears glasses Wears Glasses: At all times Patient Visual Report: No change from baseline Perception    Praxis   Cognition Overall Cognitive Status: History of cognitive impairments - at baseline Arousal/Alertness: Awake/alert Orientation Level: Oriented to person;Oriented to place;Oriented to time;Oriented to situation Sensation Sensation Light Touch: Appears Intact Hot/Cold: Appears Intact Proprioception: Appears Intact Stereognosis: Appears Intact Coordination Gross Motor Movements are Fluid and Coordinated: No Fine Motor Movements are Fluid and Coordinated: No Motor  Motor Motor - Discharge Observations: generalized weakness Mobility  Bed Mobility Bed Mobility: Rolling Right;Rolling Left;Supine to Sit;Sit to Supine Rolling Right: Supervision/verbal cueing Rolling Left: Supervision/Verbal cueing Supine to Sit: Supervision/Verbal cueing Sit to Supine: Supervision/Verbal cueing Transfers Sit to Stand: Supervision/Verbal cueing Stand to Sit: Supervision/Verbal cueing  Trunk/Postural Assessment  Cervical Assessment Cervical Assessment: Exceptions to WFL(forward head) Thoracic Assessment Thoracic Assessment: Exceptions to WFL(rounded shoulders) Lumbar Assessment Lumbar Assessment: Exceptions to WFL(posterior pelvic tilt) Postural Control Postural Control: Deficits on evaluation  Balance Balance Balance Assessed: Yes Static Sitting Balance Static Sitting - Balance Support: No upper extremity supported;Feet supported Static Sitting - Level of Assistance: 5: Stand by assistance Dynamic Sitting Balance Dynamic Sitting - Balance Support: During functional activity;Feet supported Dynamic Sitting - Level of Assistance: 5: Stand by assistance Static Standing Balance Static Standing - Balance Support: During functional activity;Bilateral upper extremity supported Static Standing - Level of Assistance: 5: Stand by assistance Dynamic  Standing Balance Dynamic Standing - Balance Support: During functional activity Dynamic Standing - Level of Assistance: 5: Stand by assistance Extremity/Trunk Assessment RUE Assessment RUE Assessment: Within Functional Limits LUE Assessment LUE Assessment: Within Functional Limits   Gypsy Decant 11/01/2018, 3:31 PM

## 2018-11-01 NOTE — Progress Notes (Signed)
Speech Language Pathology Daily Session Note  Patient Details  Name: Dennis Williams MRN: [id number] Date of Birth: [birth year]  Today's Date: 11/01/2018 SLP Individual Time: 2800-3491 SLP Individual Time Calculation (min): 40 min  Short Term Goals: Week 1: SLP Short Term Goal 1 (Week 1): Pt will recall basic daily information with Mod A verbal cues for use of external aids. SLP Short Term Goal 1 - Progress (Week 1): Progressing toward goal SLP Short Term Goal 2 (Week 1): Pt will consistently demonstrate Ox4 with min A verbal cues SLP Short Term Goal 2 - Progress (Week 1): Progressing toward goal SLP Short Term Goal 3 (Week 1): Pt will complete basic functional problem solving/thought organization/reasoning for basic and familiar tasks with Mod A SLP Short Term Goal 3 - Progress (Week 1): Partly met  Skilled Therapeutic Interventions: Pt was seen for skilled ST intervention targeting goals for improved cognition. Less anxiety was noted today. SLP facilitated session by providing mazes to address problem solving and planning. Moderate encouragement was required for pt to try the mazes, and min verbal assist to complete only the most difficult of the 4. Pt demonstrated orientation to month, year, situation, and place without cues. Pt recalled difficulty with blood sugar levels this morning, and was able to recall specific tasks he completed in his morning PT session (per PTA note). Pt was left in recliner with alarm on, all needs within reach. Continue ST per current plan of care.  Pain Pain Assessment Pain Scale: 0-10 Pain Score: 0-No pain  Therapy/Group: Individual Therapy   Ehab Humber B. Quentin Ore, [facility], [place] Speech Language Pathologist  Shonna Chock 11/01/2018, 1:20 PM

## 2018-11-01 NOTE — Significant Event (Signed)
Hypoglycemic Event  CBG: 65 @ 0613  Treatment: 240 ml Coke- pt only able to drink 120 ml, 1/2 cup of sweetened peaches; breakfast tray arrived, pt is not eating bacon and eggs.    Symptoms: asymptomatic   Follow-up CBG: Time: 0640 CBG Result:80  Possible Reasons for Event: Unknown   Comments/MD notified: Provider notified.     Aubert Choyce Silas Flood

## 2018-11-01 NOTE — Progress Notes (Signed)
Patient refuses CPAP for tonight.  

## 2018-11-01 NOTE — Progress Notes (Signed)
Occupational Therapy Session Note  Patient Details  Name: ANSEN SAYEGH MRN: [id number] Date of Birth: Apr 09, 1934  Today's Date: 11/01/2018 OT Individual Time: 3704-8889 OT Individual Time Calculation (min): 43 min    Short Term Goals: Week 1:  OT Short Term Goal 1 (Week 1): STGs equal to LTGs set at supervision level overall.   Skilled Therapeutic Interventions/Progress Updates:    Upon entering the room, pt seated in wheelchair. Pt with no c/o pain but mumbling under breath and not answering questions. OT asking pt to don B shoes in order to ambulate. He told therapist where shoes were and they were placed in front of feet. Pt looking at shoes and making no move to don with maximal cuing and increased time of 15 minutes. Pt then reaching down and removing socks independently. Pt shaking head "no" when asked to participate in therapeutic interventions this session. OT discussed upcoming discharge plan for Friday in which pt stating, " That's news to me." Pt becoming very restless and continuing to mumble under breath while OT attempting to have pt transfer to a more comfortable chair or back into bed. Pt refusing and remaining in wheelchair . RN notified. Chair alarm activated. Call bell within reach.   Therapy Documentation Precautions:  Precautions Precautions: Fall Restrictions Weight Bearing Restrictions: No General: General OT Amount of Missed Time: 15 Minutes PT Missed Treatment Reason: Other (Comment)(pt eating) Vital Signs: Therapy Vitals Temp: 98 F (36.7 C) Temp Source: Oral Pulse Rate: (!) 103 Resp: 20 BP: 126/74 Patient Position (if appropriate): Sitting Oxygen Therapy SpO2: 98 % O2 Device: Room Air Pain: Pain Assessment Pain Scale: 0-10 Pain Score: 0-No pain ADL: ADL Eating: Supervision/safety Where Assessed-Eating: Wheelchair Grooming: Supervision/safety Where Assessed-Grooming: Wheelchair Upper Body Bathing: Supervision/safety Where Assessed-Upper  Body Bathing: Wheelchair Lower Body Bathing: Moderate assistance Where Assessed-Lower Body Bathing: Wheelchair Upper Body Dressing: Moderate assistance Where Assessed-Upper Body Dressing: Wheelchair Lower Body Dressing: Moderate assistance Where Assessed-Lower Body Dressing: Wheelchair Toileting: Moderate assistance Where Assessed-Toileting: Glass blower/designer: Moderate assistance Toilet Transfer Method: Counselling psychologist: Raised toilet seat, Grab bars Vision Baseline Vision/History: Wears glasses Wears Glasses: At all times Patient Visual Report: No change from baseline   Therapy/Group: Individual Therapy  Gypsy Decant 11/01/2018, 4:01 PM

## 2018-11-01 NOTE — Progress Notes (Signed)
[place] PHYSICAL MEDICINE & REHABILITATION PROGRESS NOTE   Subjective/Complaints:  No BM since 5/31 Poor appetite Low CBG last noc  Review of systems: Denies CP, SOB, N/V/D  Objective:   No results found. No results for input(s): WBC, HGB, HCT, PLT in the last 72 hours. Recent Labs    10/30/18 0819  NA 140  K 4.2  CL 103  CO2 26  GLUCOSE 152*  BUN 42*  CREATININE 2.22*  CALCIUM 9.4    Intake/Output Summary (Last 24 hours) at 11/01/2018 1047 Last data filed at 11/01/2018 0746 Gross per 24 hour  Intake 641 ml  Output 750 ml  Net -109 ml     Physical Exam: Vital Signs Blood pressure (!) 171/95, pulse 100, temperature 98 F (36.7 C), resp. rate 15, height '5\' 8"'  (1.727 m), weight 107.2 kg, SpO2 98 %.   Constitutional: No distress . Vital signs reviewed. HENT: Normocephalic.  Atraumatic Eyes: EOMI.  No discharge. Cardiovascular: No JVD. Respiratory: Normal effort. GI: Non-distended. Musc: No edema or tenderness in extremities. Skin: Vascular changes bilateral lower extremities Neurologic: Alert Motor: RUE/RLE: 4+/5 proximal distal, stable LUE/LLE: 4/5 proximal distal Psych: Very flat.  Assessment/Plan: 1. Functional deficits secondary to right frontal infarct with left hemiparesis which require 3+ hours per day of interdisciplinary therapy in a comprehensive inpatient rehab setting.  Physiatrist is providing close team supervision and 24 hour management of active medical problems listed below.  Physiatrist and rehab team continue to assess barriers to discharge/monitor patient progress toward functional and medical goals  Care Tool:  Bathing  Bathing activity did not occur: Refused Body parts bathed by patient: Right arm, Left arm, Chest, Face, Right lower leg, Left lower leg, Left upper leg, Right upper leg, Abdomen, Front perineal area, Buttocks     Body parts n/a: Front perineal area, Buttocks(pt completed earlier with nursing per her report)    Bathing assist Assist Level: Contact Guard/Touching assist     Upper Body Dressing/Undressing Upper body dressing   What is the patient wearing?: Button up shirt    Upper body assist Assist Level: Supervision/Verbal cueing    Lower Body Dressing/Undressing Lower body dressing    Lower body dressing activity did not occur: N/A What is the patient wearing?: Underwear/pull up, Pants     Lower body assist Assist for lower body dressing: Contact Guard/Touching assist     Toileting Toileting    Toileting assist Assist for toileting: Contact Guard/Touching assist     Transfers Chair/bed transfer  Transfers assist  Chair/bed transfer activity did not occur: N/A  Chair/bed transfer assist level: Contact Guard/Touching assist     Locomotion Ambulation   Ambulation assist      Assist level: Contact Guard/Touching assist Assistive device: Walker-rolling Max distance: 15'   Walk 10 feet activity   Assist     Assist level: Contact Guard/Touching assist Assistive device: Walker-rolling   Walk 50 feet activity   Assist Walk 50 feet with 2 turns activity did not occur: Safety/medical concerns(decreased strength/activity tolerance)  Assist level: Contact Guard/Touching assist Assistive device: Walker-rolling    Walk 150 feet activity   Assist Walk 150 feet activity did not occur: Safety/medical concerns(decreased strength/endurance)  Assist level: Contact Guard/Touching assist Assistive device: Walker-rolling    Walk 10 feet on uneven surface  activity   Assist Walk 10 feet on uneven surfaces activity did not occur: Safety/medical concerns(Per report)         Wheelchair     Assist Will patient use  wheelchair at discharge?: Yes Type of Wheelchair: Manual    Wheelchair assist level: Supervision/Verbal cueing Max wheelchair distance: 80'(x 2 reps)    Wheelchair 50 feet with 2 turns activity    Assist        Assist Level:  Supervision/Verbal cueing   Wheelchair 150 feet activity     Assist Wheelchair 150 feet activity did not occur: Safety/medical concerns(due to poor endurance)        Medical Problem List and Plan: 1.Left-sided weaknesssecondary to small acute posterior right frontal infarction  Continue CIR PT, OT, SLP  Fluoxetine started on 5/31- will chage to remeron due to insomnia, may help with appetite Team conference today please see physician documentation under team conference tab, met with team face-to-face to discuss problems,progress, and goals. Formulized individual treatment plan based on medical history, underlying problem and comorbidities. 2. Antithrombotics: -DVT/anticoagulation:Chronic Coumadin  pharmacy protocol  -antiplatelet therapy: N/A 3. Pain Management:Tylenol as needed 4. Mood:Recent diagnosis of dementia   Fluoxetine started on 5/31 -antipsychotic agents: N/A 5. Neuropsych: This patientis?  Fully capable of making decisions on hisown behalf. 6. Skin/Wound Care:Routine skin checks 7. Fluids/Electrolytes/Nutrition:Routine in and outs 8.Atrial fibrillation. Cardiac rate controlled. Continue Coumadin. Vitals:   10/31/18 1929 11/01/18 0615  BP: (!) 126/99 (!) 171/95  Pulse: 82 100  Resp: 16 15  Temp: 98.3 F (36.8 C) 98 F (36.7 C)  SpO2: 97% 98%  labile 9.Diabetes mellitus with peripheral neuropathy. Check blood sugars before meals and at bedtime CBG (last 3)  Recent Labs    10/31/18 2111 11/01/18 0613 11/01/18 0639  GLUCAP 112* 65* 80   Lantus increased to 35 nightly on 5/30  Improving 6/1 but too low on 6/3, reduce to 30U 10.Chronic diastolic congestive heart failure. Monitor for any signs of fluid overload. Lasix 80 mg daily.  Currently without lower extremity edema  Daily weights ordered [facility] Weights   10/27/18 0557 10/31/18 0528 10/31/18 1929  Weight: 107.1 kg 107.3 kg 107.2 kg    11. Hyperlipidemia.  Lipitor 12. History of tobacco use. Provide counseling 13. OSA. Continue CPAP 14.Chronic renal insufficiency with baseline creatinine 1.83-1.97.  Creatinine 1.73 on 5/29, labs ordered for 6/1, creat 2.22 elevated over baseline , reduce lasix intake ~630m per day 15. Question alcohol use. No signs of withdrawal    16.  Hypokalemia likely due to Lasix will supplement KCl  Potassium 3.5 on 5/29, labs ordered for 6/1 show improvement   LOS: 8 days A FACE TO FACE EVALUATION WAS PERFORMED  ACharlett Blake6/07/2018, 10:47 AM
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
## 2018-11-02 ENCOUNTER — Inpatient Hospital Stay (HOSPITAL_COMMUNITY): Payer: Medicare Other

## 2018-11-02 ENCOUNTER — Inpatient Hospital Stay (HOSPITAL_COMMUNITY): Payer: Medicare Other | Admitting: Speech Pathology

## 2018-11-02 ENCOUNTER — Inpatient Hospital Stay (HOSPITAL_COMMUNITY): Payer: Medicare Other | Admitting: Rehabilitation

## 2018-11-02 LAB — GLUCOSE, CAPILLARY
Glucose-Capillary: 130 mg/dL — ABNORMAL HIGH (ref 70–99)
Glucose-Capillary: 128 mg/dL — ABNORMAL HIGH (ref 70–99)
Glucose-Capillary: 118 mg/dL — ABNORMAL HIGH (ref 70–99)
Glucose-Capillary: 85 mg/dL (ref 70–99)
Glucose-Capillary: 129 mg/dL — ABNORMAL HIGH (ref 70–99)

## 2018-11-02 MED ORDER — POLYETHYLENE GLYCOL 3350 17 G PO PACK
17.0000 g | PACK | Freq: Every day | ORAL | Status: DC
  Administered 2018-11-02 – 2018-11-03 (×2): 17 g via ORAL
  Filled 2018-11-02 (×2): qty 1

## 2018-11-02 MED ORDER — INSULIN GLARGINE 100 UNITS/ML SOLOSTAR PEN: 30.0000 [IU] | PEN_INJECTOR | Freq: Every day | SUBCUTANEOUS | 11 refills | Status: DC

## 2018-11-02 MED ORDER — MIRTAZAPINE 7.5 MG PO TABS: 7.5000 mg | ORAL_TABLET | Freq: Every day | ORAL | 0 refills | Status: AC

## 2018-11-02 MED ORDER — ATORVASTATIN CALCIUM 80 MG PO TABS: 80.0000 mg | ORAL_TABLET | Freq: Every day | ORAL | 0 refills | Status: AC

## 2018-11-02 MED ORDER — SORBITOL 70 % SOLN
30.0000 mL | Freq: Every day | Status: DC | PRN
  Administered 2018-11-02: 30 mL via ORAL
  Filled 2018-11-02 (×2): qty 30

## 2018-11-02 MED ORDER — INSULIN GLARGINE 100 UNIT/ML SOLOSTAR PEN: 30.0000 [IU] | PEN_INJECTOR | Freq: Every day | SUBCUTANEOUS | 11 refills | Status: AC

## 2018-11-02 MED ORDER — POTASSIUM CHLORIDE CRYS ER 20 MEQ PO TBCR: 20.0000 meq | EXTENDED_RELEASE_TABLET | Freq: Two times a day (BID) | ORAL | 0 refills | Status: AC

## 2018-11-02 MED ORDER — POLYETHYLENE GLYCOL 3350 17 G PO PACK: 17.0000 g | PACK | Freq: Every day | ORAL | 0 refills | Status: DC

## 2018-11-02 MED ORDER — FUROSEMIDE 20 MG PO TABS: 60.0000 mg | ORAL_TABLET | Freq: Every day | ORAL | 1 refills | Status: AC

## 2018-11-02 NOTE — Progress Notes (Signed)
Physical Therapy Session Note  Patient Details  Name: KINDRED REIDINGER MRN: [id number] Date of Birth: 1934/02/03  Today's Date: 11/02/2018 PT Individual Time: 5072-2575 PT Individual Time Calculation (min): 54 min   Short Term Goals: Week 2:   see week 1 goals  Skilled Therapeutic Interventions/Progress Updates:   Pt received sitting in w/c in room, reluctantly agreeable to PT session.  Pt requires max encouragement from PT to stand to RW.  Pt performed all sit<>stand and gait during session at min/guard level (PT did bring chair in case of fatigue).  Once up in room, pt verbalizing needing to use restroom.  Pt did better today with clothing management in regards to standing balance than previous session with this PT.  Pt also able to stand at sink and wash/dry hands without UE support with only mild sway today.   Pt ambulatory x 150' to therapy gym at min/guard to close S level with continued cues for posture and stride length.  Once in therapy gym, pt able to negotiate up/down curb step x 2 reps with RW at min/guard level with cues for safety and RW management.  PT had pt trial use of SBQC during session x 5' forward/backwards with L HHA (cane in R UE).  Pt did very well at min A level but was extremely fearful.  PT set up chair as target in gym to ambulate to again with cane x 15'.  Pt actually ambulated to chair and back without sitting.  Pt was unwilling to continue with gait with quad cane, therefore had pt return to day room to perform seated nustep for BUE/LE strengthening and endurance x 6 mins at level 3 resistance.  Pt able to maintain rpm in 40s-50's.  Pt ambulated back to room x 150' at S level with RW.  Pt left in w/c with chair alarm set and all needs in reach.   Therapy Documentation Precautions:  Precautions Precautions: Fall Restrictions Weight Bearing Restrictions: No  Pain: Pain Assessment Pain Scale: 0-10 Pain Score: 0-No pain    Therapy/Group: Individual  Therapy  Denice Bors 11/02/2018, 1:37 PM

## 2018-11-02 NOTE — Progress Notes (Signed)
Occupational Therapy Session Note  Patient Details  Name: Dennis Williams MRN: [id number] Date of Birth: 07-13-1933  Today's Date: 11/02/2018 OT Individual Time: 6270-0484 OT Individual Time Calculation (min): 50 min    Short Term Goals: Week 1:  OT Short Term Goal 1 (Week 1): STGs equal to LTGs set at supervision level overall.   Skilled Therapeutic Interventions/Progress Updates:    1:1. Pt received in w/c with no report of pain. Pt completes dressing, toileting and toilet transfers with supervision level with VC for keeping RW in front of pt during sit to stands as well as not pushing RW too far out in front of pt. Pt completes ambulatory transfer to TTB in ADL apartment and requires VC for sequencing. W/c mobility and ambulation at supervision level utilized to mobilize back to room. Pts wife called for family education on CLOF, supervision level goals, DME, TTB sequencing, needing to be next to pt when ambulating and cognition. Pts wife with no further questions. Exited session with pt seated in w/c, call light in reach and all needs met Therapy Documentation Precautions:  Precautions Precautions: Fall Restrictions Weight Bearing Restrictions: No General:   Vital Signs: Therapy Vitals Temp: 97.8 F (36.6 C) Temp Source: Oral Pulse Rate: 65 Resp: (!) 22 BP: 131/79 Oxygen Therapy SpO2: 98 % Pain:   ADL: ADL Eating: Supervision/safety Where Assessed-Eating: Wheelchair Grooming: Supervision/safety Where Assessed-Grooming: Wheelchair Upper Body Bathing: Supervision/safety Where Assessed-Upper Body Bathing: Wheelchair Lower Body Bathing: Moderate assistance Where Assessed-Lower Body Bathing: Wheelchair Upper Body Dressing: Moderate assistance Where Assessed-Upper Body Dressing: Wheelchair Lower Body Dressing: Moderate assistance Where Assessed-Lower Body Dressing: Wheelchair Toileting: Moderate assistance Where Assessed-Toileting: Glass blower/designer: Moderate  assistance Toilet Transfer Method: Counselling psychologist: Raised toilet seat, Grab bars Vision   Perception    Praxis Praxis: Intact Exercises:   Other Treatments:     Therapy/Group: Individual Therapy  Tonny Branch 11/02/2018, 4:26 PM

## 2018-11-02 NOTE — Progress Notes (Addendum)
Physical Therapy Session Note  Patient Details  Name: Dennis Williams MRN: [id number] Date of Birth: 03/23/1934  Today's Date: 11/02/2018 PT Individual Time: 9767-3419 and  1102-1202 PT Individual Time Calculation (min): 45 min and 60 min   Short Term Goals: Week 1:  PT Short Term Goal 1 (Week 1): STG=LTG due to ELOS.  Skilled Therapeutic Interventions/Progress Updates:     Session 1: Patient was in bed upon PT entry. Patient was alert and agreeable to PT session.  Therapeutic Activity: Bed Mobility: Patient rolled Williams and R and went from supine to/from sit independently with increased time without cuing from therapist. Transfers: Patient performed sit to/from stand x1 with min A, stand pivot x1 and toilet transfers x1 with supervision using RW. Patient performed peri care with total A and LB dressing with min A-CGA.   Gait training: Patient ambulated 150' x1 and 15' x1 using a RW with supervision for safety. Ambulated with decreased gait speed, decreased step length and height, significant forward trunk lean, and downward head gaze. Provided verbal cues for erect posture, increased step length and height, and looking ahead. RPE 6/10, BP 178/77, HR 85, and O2 99% after ambulating 150'.  Patient was sitting in the w/c with the chair alarm set and all needs in reach at end of session. BP 143/93 and HR 80.  Session 2; Patient in w/c upon PT arrival. Patient alert and agreeable to PT session.  Therapeutic Activity: Transfers: Patient performed sit to/from stand x5, a toilet transfer x1, and a car transfer x1 with supervision using a RW without cuing from therapist. Patient performed peri care with supervision and LB dressing with supervision-CGA.He performed a furniture transfer with min A due to the low surface, attempted a second time with a pillow underneath him to elevated the height of the surface and he was able to stand with supervision, educated on using this modification at home for  furniture transfers.    Gait Training:  Patient ambulated 150' x2, 15' x1 and 100' x1 feet using RW with supervision and w/c following for safety and decreased activity tolerance. Ambulated with decreased gait speed, decreased step length, increased hip and knee flexion, forward trunk lean, and downward head gaze. Provided verbal cues for erect posture and looking ahead. He went up/down 4 steps using B rails with CGA, RPE 7/10 after and patient was provided a seated rest break. He ambulated up/down a ramp, 10' of unlevel surface, and up/down a curb using a RW with CGA for safety and balance. Provided v/c's for use of RW on unlevel surfaces and for the curb.  Wheelchair Mobility:  Patient propelled wheelchair 100 feet with supervision using B UEs and LEs.   Patient in w/c at end of session with breaks locked, chair alarm set, and all needs within reach. Educated on fall risk and prevention at home, calling 911 in the event of a fall, removing tripping hazards from the floor, and scheduling voiding to avoid urgency and rushing to the bathroom.   Therapy Documentation Precautions:  Precautions Precautions: Fall Restrictions Weight Bearing Restrictions: No Pain: Patient denied pain throughout sessions.    Therapy/Group: Individual Therapy  Dennis Williams Dennis Williams PT, DPT  11/02/2018, 12:55 PM

## 2018-11-02 NOTE — Progress Notes (Signed)
Speech Language Pathology Daily Session Note  Patient Details  Name: Dennis Williams MRN: [id number] Date of Birth: 04/08/1934  Today's Date: 11/02/2018 SLP Individual Time: 5093-2671 SLP Individual Time Calculation (min): 30 min  Short Term Goals: Week 2: SLP Short Term Goal 1 (Week 2): Pt will recall basic daily information with Min A verbal cues for use of external aids. SLP Short Term Goal 2 (Week 2): Pt will consistently demonstrate Ox4 with min A verbal cues SLP Short Term Goal 3 (Week 2): Pt will complete basic functional problem solving/thought organization/reasoning for basic and familiar tasks with Min A SLP Short Term Goal 4 (Week 2): Pt will demonstrate selective attention in 15 minute intervals with min A verbal cues for redirection.  Skilled Therapeutic Interventions: Patient received skilled SLP services targeting cognitive goals. Patient was alert and oriented x4 independently. Patient independently recalled 3 tasks that he completed with physical therapy this morning. Patient required max verbal cues to respond to functional problem solving scenarios for the home environment with 75% accuracy. Patient did require encouragement to participate this date. At the end of therapy session patient was upright in wheel chair, call bell within reach, and chair alarm activated.   Pain Pain Assessment Pain Scale: 0-10 Pain Score: 0-No pain  Therapy/Group: Individual Therapy  Cristy Folks 11/02/2018, 12:08 PM

## 2018-11-02 NOTE — Progress Notes (Signed)
Per night shift nurse pt daughter Lyndle Herrlich [phone] called upset and concerned that pt cant go home tomorrow that her mom is not able to take care of pt. Lft msg with Sonia Baller.

## 2018-11-02 NOTE — Discharge Summary (Signed)
Physician Discharge Summary  Patient ID: Dennis Williams MRN: [id number] DOB/AGE: 83-Oct-1935 83 y.o.  Admit date: 10/24/2018 Discharge date: 11/03/2018  Discharge Diagnoses:  Active Problems:   Ischemic cerebrovascular accident (CVA) of frontal lobe (HCC)   Chronic diastolic congestive heart failure (HCC)   Type 2 diabetes mellitus with peripheral neuropathy (HCC)   Senile dementia without behavioral disturbance (HCC)   Senile dementia with behavioral disturbance (HCC) DVT prophylaxis Atrial fibrillation Hyperlipidemia History of tobacco abuse Chronic renal insufficiency  Discharged Condition: Stable  Significant Diagnostic Studies: Ct Head Wo Contrast  Result Date: 10/23/2018 CLINICAL DATA:  Left-sided weakness EXAM: CT HEAD WITHOUT CONTRAST TECHNIQUE: Contiguous axial images were obtained from the base of the skull through the vertex without intravenous contrast. COMPARISON:  None. FINDINGS: Brain: No evidence of acute infarction, hemorrhage, hydrocephalus, extra-axial collection or mass lesion/mass effect. There is mild age related volume loss as well as microvascular ischemic changes bilaterally. There is slight increased hypoattenuation involving the right anterior temporal lobe, likely artifactual. Vascular: No hyperdense vessel or unexpected calcification. Skull: Normal. Negative for fracture or focal lesion. Sinuses/Orbits: No acute finding. Other: None. IMPRESSION: No acute intracranial abnormality. Electronically Signed   By: Constance Holster M.D.   On: 10/23/2018 01:19   Mr [name] Head Wo Contrast  Result Date: 10/23/2018 CLINICAL DATA:  Acute left-sided weakness. EXAM: MRI HEAD WITHOUT CONTRAST MRA HEAD WITHOUT CONTRAST TECHNIQUE: Multiplanar, multiecho pulse sequences of the brain and surrounding structures were obtained without intravenous contrast. Angiographic images of the head were obtained using MRA technique without contrast. COMPARISON:  Head CT 10/23/2018 and MRI  09/01/2018 FINDINGS: MRI HEAD FINDINGS The study is mildly motion degraded. Brain: There is a small acute cortical and subcortical infarction in the posterior right frontal lobe which involves the precentral gyrus. Scattered cerebral white matter T2 hyperintensities are similar to the prior MRI and nonspecific but compatible with mild chronic small vessel ischemic disease. A chronic lacunar infarct is again noted in the right thalamus. Cerebral atrophy is not greater than expected for age. Vascular: Major intracranial vascular flow voids are preserved. Skull and upper cervical spine: Unremarkable bone marrow signal. Sinuses/Orbits: Unremarkable orbits. Paranasal sinuses and mastoid air cells are clear. Other: None. MRA HEAD FINDINGS The visualized distal vertebral arteries are widely patent to the basilar with the left being mildly dominant. Patent PICA and SCA origins are identified bilaterally. The basilar artery is widely patent. Posterior communicating arteries are not well seen and may be diminutive or absent. PCAs are patent without evidence of significant proximal stenosis. The internal carotid arteries are widely patent from skull base to carotid termini. ACAs and MCAs are patent without evidence of proximal branch occlusion or significant proximal stenosis. No aneurysm is identified. IMPRESSION: 1. Small acute posterior right frontal infarct. 2. Mild chronic small vessel ischemic disease. 3. Negative head MRA. Electronically Signed   By: Logan Bores M.D.   On: 10/23/2018 09:36   Mr Brain Wo Contrast  Result Date: 10/23/2018 CLINICAL DATA:  Acute left-sided weakness. EXAM: MRI HEAD WITHOUT CONTRAST MRA HEAD WITHOUT CONTRAST TECHNIQUE: Multiplanar, multiecho pulse sequences of the brain and surrounding structures were obtained without intravenous contrast. Angiographic images of the head were obtained using MRA technique without contrast. COMPARISON:  Head CT 10/23/2018 and MRI 09/01/2018 FINDINGS: MRI  HEAD FINDINGS The study is mildly motion degraded. Brain: There is a small acute cortical and subcortical infarction in the posterior right frontal lobe which involves the precentral gyrus. Scattered cerebral white matter T2 hyperintensities  are similar to the prior MRI and nonspecific but compatible with mild chronic small vessel ischemic disease. A chronic lacunar infarct is again noted in the right thalamus. Cerebral atrophy is not greater than expected for age. Vascular: Major intracranial vascular flow voids are preserved. Skull and upper cervical spine: Unremarkable bone marrow signal. Sinuses/Orbits: Unremarkable orbits. Paranasal sinuses and mastoid air cells are clear. Other: None. MRA HEAD FINDINGS The visualized distal vertebral arteries are widely patent to the basilar with the left being mildly dominant. Patent PICA and SCA origins are identified bilaterally. The basilar artery is widely patent. Posterior communicating arteries are not well seen and may be diminutive or absent. PCAs are patent without evidence of significant proximal stenosis. The internal carotid arteries are widely patent from skull base to carotid termini. ACAs and MCAs are patent without evidence of proximal branch occlusion or significant proximal stenosis. No aneurysm is identified. IMPRESSION: 1. Small acute posterior right frontal infarct. 2. Mild chronic small vessel ischemic disease. 3. Negative head MRA. Electronically Signed   By: Logan Bores M.D.   On: 10/23/2018 09:36   Vas US Carotid  Result Date: 10/24/2018 Carotid Arterial Duplex Study Indications:       CVA, Weakness and Gait disturbance. Risk Factors:      Hypertension, hyperlipidemia, Diabetes, past history of                    smoking. Other Factors:     Atrial fibrillation, on Coumadin. Recent diagnosis of                    dementia with behavior disturbance. Comparison Study:  No prior study on file for comparison. Performing Technologist: Sharion Dove  RVS  Examination Guidelines: A complete evaluation includes B-mode imaging, spectral Doppler, color Doppler, and power Doppler as needed of all accessible portions of each vessel. Bilateral testing is considered an integral part of a complete examination. Limited examinations for reoccurring indications may be performed as noted.  Right Carotid Findings: +----------+--------+--------+--------+------------+---------+           PSV cm/sEDV cm/sStenosisDescribe    Comments  +----------+--------+--------+--------+------------+---------+ CCA Prox  96      15                                    +----------+--------+--------+--------+------------+---------+ CCA Distal81      15              heterogenous          +----------+--------+--------+--------+------------+---------+ ICA Prox  72      16              calcific    Shadowing +----------+--------+--------+--------+------------+---------+ ICA Distal64      10                                    +----------+--------+--------+--------+------------+---------+ ECA       115     14                                    +----------+--------+--------+--------+------------+---------+ +----------+--------+-------+--------+-------------------+           PSV cm/sEDV cmsDescribeArm Pressure (mmHG) +----------+--------+-------+--------+-------------------+ [id number]                                        +----------+--------+-------+--------+-------------------+ +---------+--------+--+--------+-+  VertebralPSV cm/s27EDV cm/s8 +---------+--------+--+--------+-+  Left Carotid Findings: +----------+--------+--------+--------+------------+------------------+           PSV cm/sEDV cm/sStenosisDescribe    Comments           +----------+--------+--------+--------+------------+------------------+ CCA Prox  101     18                          intimal thickening  +----------+--------+--------+--------+------------+------------------+ CCA Distal71      12              homogeneous                    +----------+--------+--------+--------+------------+------------------+ ICA Prox  109     13              heterogenous                   +----------+--------+--------+--------+------------+------------------+ ICA Distal81      24                                             +----------+--------+--------+--------+------------+------------------+ ECA       141     22                                             +----------+--------+--------+--------+------------+------------------+ +----------+--------+--------+--------+-------------------+ SubclavianPSV cm/sEDV cm/sDescribeArm Pressure (mmHG) +----------+--------+--------+--------+-------------------+           71                                          +----------+--------+--------+--------+-------------------+ +---------+--------+--+--------+-+ VertebralPSV cm/s56EDV cm/s4 +---------+--------+--+--------+-+  Summary: Right Carotid: Velocities in the right ICA are consistent with a 1-39% stenosis. Left Carotid: Velocities in the left ICA are consistent with a 1-39% stenosis. Vertebrals:  Bilateral vertebral arteries demonstrate antegrade flow. Subclavians: Normal flow hemodynamics were seen in bilateral subclavian              arteries. *See table(s) above for measurements and observations.  Electronically signed by Antony Contras Dennis Williams on 10/24/2018 at 12:46:39 PM.    Final     Labs:  Basic Metabolic Panel: Recent Labs  Lab 10/27/18 0506 10/30/18 0819  NA 139 140  K 3.5 4.2  CL 103 103  CO2 25 26  GLUCOSE 158* 152*  BUN 32* 42*  CREATININE 1.73* 2.22*  CALCIUM 9.2 9.4    CBC: Recent Labs  Lab 11/01/18 1313  WBC 7.4  HGB 16.1  HCT 48.0  MCV 91.1  PLT 211    CBG: Recent Labs  Lab 11/01/18 0613 11/01/18 0639 11/01/18 1131 11/01/18 1625 11/01/18 2138  GLUCAP  65* 80 121* 97 121*   Family history.  Father with CAD.  Brother with cancer.  Denies any diabetes  Brief HPI:    Dennis Williams is an 85 year old right-handed male with history of atrial fibrillation maintained on chronic Coumadin, chronic diastolic congestive heart failure, hypertension, tobacco abuse, renal insufficiency, insulin-dependent diabetes mellitus and recent diagnosis of dementia with behavioral disturbance.  Per chart review lives with spouse independent with assistive device using a cane.  Presented 10/23/2018 with left side weakness.  INR  on admission 2.1 urine study negative.  COVID negative.  Cranial CT scan negative.  Patient did not receive TPA.  MRI/MRA showed small acute posterior right frontal infarction.  Mild chronic small vessel ischemic disease.  MRA negative.  EEG negative.  Carotid Dopplers with no ICA stenosis.  Echocardiogram with ejection fraction of 37% normal systolic function.  Neurology follow-up patient remained on chronic Coumadin therapy.  Patient was admitted for a comprehensive rehab program.  Hospital Course: Dennis Williams was admitted to rehab 10/24/2018 for inpatient therapies to consist of PT, ST and OT at least three hours five days a week. Past admission physiatrist, therapy team and rehab RN have worked together to provide customized collaborative inpatient rehab.  Pertaining to patient's small acute posterior right frontal infarction.  He would continue on chronic Coumadin therapy as prior to admission for atrial fibrillation.  Patient would follow-up with neurology services.  No bleeding episodes.  Recent diagnosis of dementia and started on fluoxetine 10/29/2018.  Cardiac rate remained controlled no chest pain or shortness of breath.  Diabetes mellitus insulin therapy with Lantus and diabetic teaching with wife.  Patient exhibited no signs of fluid overload he remained on Lasix therapy.  Lipitor ongoing for hyperlipidemia.  Chronic renal insufficiency  baseline creatinine 1.83-1.97 monitor closely while on Lasix.  There was some question of possible alcohol use in the past no signs of withdrawal and monitored.  Physical exam.  Blood pressure 116/63 pulse 65 temperature 98 respirations 16 oxygen saturations 95% room air Constitutional.  Appears well-developed no distress HEENT Head.  Normocephalic and atraumatic Eyes.  Pupils round and reactive to light EOMs normal no discharge Neck.  Supple nontender no JVD no tracheal deviation without thyromegaly Cardiovascular.  Normal rate and rhythm no friction rub or murmur heard Respiratory.  Effort normal no respiratory distress he has no wheezes GI.  Soft exhibits no distention nontender good bowel sounds Neurological.  Alert coordination normal makes eye contact with examiner follows commands he does provide his name and age  Rehab course: During patient's stay in rehab weekly team conferences were held to monitor patient's progress, set goals and discuss barriers to discharge. At admission, patient required minimal assist to ambulate 18 feet rolling walker.  Minimal guard supine to sit.  Minimal assist sit to stand.  Minimal assist upper body bathing max assist lower body bathing minimal assist upper body dressing max assist lower body dressing minimal assist toilet transfers  He  has had improvement in activity tolerance, balance, postural control as well as ability to compensate for deficits. He has had improvement in functional use RUE/LUE  and RLE/LLE as well as improvement in awareness.  Patient performed bed mobility with use of features supervision with increased time.  Patient was able to put his shoes on with increased time.  Patient encouraged to transfer to wheelchair with some cues for safety.  Perform sit to stand supervision ambulating contact-guard assist to the toilet with rolling walker.  Perform toilet transfers contact-guard assist.  Ambulates to the sink to perform hand hygiene.   Participated in 2 bouts of horseshoes with encouragement to participate in standing.  He did need some little assistance to pull up his pants during ADLs.  Full family teaching was completed and plan discharged to home.  It was discussed with wife the need for supervision for his safety.       Disposition: Discharge to home   Diet: Carb modified  Special Instructions: No driving smoking or alcohol  Home health nurse to check INR on 11/07/2018 results to Dr. Janace Litten [phone] fax number [phone]  Medications at discharge. 1.  Tylenol as needed 2.  Lipitor 80 mg p.o. daily 3.  Lasix 60 mg p.o. daily 4.  Lantus insulin 30 units bedtime 5.  Remeron 7.5 mg p.o. nightly 6.  Potassium chloride 20 mEq p.o. twice daily 7.  Coumadin 5 mg daily  Discharge Instructions    Ambulatory referral to Neurology   Complete by:  As directed    An appointment is requested in approximately 4 to 6 weeks small right frontal infarction   Ambulatory referral to Physical Medicine Rehab   Complete by:  As directed    Moderate complexity follow-up 1 to 2 weeks right frontal infarction      Follow-up Information    Dennis Williams, Dennis Salk, Dennis Williams Follow up.   Specialty:  Physical Medicine and Rehabilitation Why:  Office to call for appointment Contact information: [place] [address] [phone]        Dennis Broker, Dennis Williams Follow up.   Specialty:  Family Medicine Contact information: [place] [address] [phone]           Signed: Lavon Paganini [place] 11/02/2018, 5:54 AM

## 2018-11-02 NOTE — Patient Care Conference (Signed)
Inpatient RehabilitationTeam Conference and Plan of Care Update Date: 11/01/2018   Time: 10:45 AM    Patient Name: Dennis Williams      Medical Record Number: [id number]  Date of Birth: 27-Jul-1933 Sex: Male         Room/Bed: 4W25C/4W25C-01 Payor Info: Payor: [place] / Plan: [employer] MEDICARE / Product Type: *No Product type* /    Admitting Diagnosis: CVA 1 Team  Rt. CVA; 13-15days  Admit Date/Time:  10/24/2018  6:45 PM Admission Comments: No comment available   Primary Diagnosis:  <principal problem not specified> Principal Problem: <principal problem not specified>  Patient Active Problem List   Diagnosis Date Noted  . Senile dementia without behavioral disturbance ([place])   . Senile dementia with behavioral disturbance ([place])   . Chronic diastolic congestive heart failure ([place])   . Type 2 diabetes mellitus with peripheral neuropathy (HCC)   . Ischemic cerebrovascular accident (CVA) of frontal lobe ([place]) 10/24/2018  . Stroke-like symptoms 10/23/2018  . CKD (chronic kidney disease), stage III ([place]) 10/23/2018  . Hypokalemia 10/23/2018  . CVA (cerebral vascular accident) ([place]) 10/23/2018  . Insulin dependent diabetes mellitus ([place]) 11/21/2016  . Cellulitis 11/21/2016  . Acute respiratory failure with hypoxia ([place]) 11/21/2016  . Sleep apnea 11/21/2016  . Constipation 11/21/2016  . Renal insufficiency   . 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 Prostate cancer ([place]) 05/11/2011  . Claudication ([place]) 08/31/2010  . Atrial fibrillation ([place])   . Hypertension   . Chronic diastolic CHF (congestive heart failure) [facility])     Expected Discharge Date: Expected Discharge Date: 11/03/18  Team Members Present: Physician leading conference: Dr. Alysia Penna Social Worker Present: Alfonse Alpers, LCSW Nurse Present: Other (comment)(Jamie Zenia Resides, RN) PT Present: Leavy Cella, PT;Rosita Dechalus, PTA OT Present: Darleen Crocker, OT SLP Present: Stormy Fabian, SLP PPS  Coordinator present : Gunnar Fusi     Current Status/Progress Goal Weekly Team Focus  Medical   Mild left hemiparesis, blood pressure still labile, elevated creatinine over baseline, likely related to decreased fluid intake as well as relatively high Lasix dose  Maintain medical stability avoid CHF exacerbation  Discharge planning   Bowel/Bladder   LBM 05/31, cont x 2   timed toileting while awake with fewer episodes of incontinence  continue with plan of care and assess toileting needs every few hours   Swallow/Nutrition/ Hydration             ADL's   Steady assist for bathing at shower level, supervision UB dressing, steady assist LB dressing, steady assist toileting + toilet transfers using RW at ambulatory level   Supervision/cuing overall   Psychosocial wellness (exhibits depressive symptoms), dynamic balance, standing tolerance, praxis, general strengthening and endurance    Mobility   CGA to close S transfers, gait, CGA 3in steps B rails  supervision overall   balance, endurance, d/c planning    Communication             Safety/Cognition/ Behavioral Observations  Mod A  Min A with basic cognition  basic memory, basic problem solving, selective attention   Pain   No c/o pain  to remain free from pain  to assess pain every shift and prn and to continue with plan of care   Skin   Skin is intact   Skin is to remain intact and free from infection  to assess skin every shift and prn and continue with plan of care    Rehab Goals Patient on target to  meet rehab goals: Yes Rehab Goals Revised: none *See Care Plan and progress notes for long and short-term goals.     Barriers to Discharge  Current Status/Progress Possible Resolutions Date Resolved   Physician    Medical stability     Progressing towards discharge goals  May need some caregiver instruction prior to discharge      Nursing                  PT                    OT                  SLP                SW                 Discharge Planning/Teaching Needs:  Pt plans to return to his home where his wife will provide supervision.  Therapist plan to do education re: pt's need for supervision over the phone.   Team Discussion:  Dr. Letta Pate is adjusting Lasix as pt was on too high of a dose and was getting too dry.  Pt's oral intake is poor.  MD is monitoring weights and lower extremity swelling.  Pt has a hypoglycemic event this morning, okay after a snack.  Dr. Letta Pate changed Prozac to Remeron in hopes that it would help with depression, sleep, and appetite.  Pt is continent of bladder and bowel and needs to have a BM, last one on 10-29-18.  Pt with flat, poor, depressed affect per RN.  Pt is close supervision/CGA with rolling walker for transfers and self care.  Told OT he's not eating much "because of my sugars" and not drinking much because he doesn't want to get up to go to the bathroom.  Needs cueing and encouragement.  Pt is at CGA/S with PT.  It's been challenging to get him to do anything lately as he is fearful and anxious about falling.  Pt is CGA for gait with RW.  ST has min A goals for cognition and pt fluctuates now between mod A to total assist.  Revisions to Treatment Plan:  none    Continued Need for Acute Rehabilitation Level of Care: The patient requires daily medical management by a physician with specialized training in physical medicine and rehabilitation for the following conditions: Daily direction of a multidisciplinary physical rehabilitation program to ensure safe treatment while eliciting the highest outcome that is of practical value to the patient.: Yes Daily medical management of patient stability for increased activity during participation in an intensive rehabilitation regime.: Yes Daily analysis of laboratory values and/or radiology reports with any subsequent need for medication adjustment of medical intervention for : Neurological problems;Blood pressure  problems   I attest that I was present, lead the team conference, and concur with the assessment and plan of the team.Team conference was held via web/ teleconference due to [place] - 19.   Lovena Kluck, Silvestre Mesi 11/02/2018, 4:48 PM

## 2018-11-02 NOTE — Progress Notes (Signed)
[facility] PHYSICAL MEDICINE & REHABILITATION PROGRESS NOTE   Subjective/Complaints:  Patient at supervision level unlikely to improve over the short-term.  Review of systems: Denies CP, SOB, N/V/D  Objective:   No results found. Recent Labs    11/01/18 1313  WBC 7.4  HGB 16.1  HCT 48.0  PLT 211   No results for input(s): NA, K, CL, CO2, GLUCOSE, BUN, CREATININE, CALCIUM in the last 72 hours.  Intake/Output Summary (Last 24 hours) at 11/02/2018 1057 Last data filed at 11/02/2018 0835 Gross per 24 hour  Intake 440 ml  Output -  Net 440 ml     Physical Exam: Vital Signs Blood pressure (!) 151/94, pulse 98, temperature 97.6 F (36.4 C), resp. rate 19, height 5\' 8"  (1.727 m), weight 105 kg, SpO2 98 %.   Constitutional: No distress . Vital signs reviewed. HENT: Normocephalic.  Atraumatic Eyes: EOMI.  No discharge. Cardiovascular: No JVD. Respiratory: Normal effort. GI: Non-distended. Musc: No edema or tenderness in extremities. Skin: Vascular changes bilateral lower extremities Neurologic: Alert Motor: RUE/RLE: 4+/5 proximal distal, stable LUE/LLE: 4/5 proximal distal Psych: Very flat.  Assessment/Plan: 1. Functional deficits secondary to right frontal infarct with left hemiparesis which require 3+ hours per day of interdisciplinary therapy in a comprehensive inpatient rehab setting.  Physiatrist is providing close team supervision and 24 hour management of active medical problems listed below.  Physiatrist and rehab team continue to assess barriers to discharge/monitor patient progress toward functional and medical goals  Care Tool:  Bathing  Bathing activity did not occur: Refused Body parts bathed by patient: Right arm, Left arm, Chest, Face, Right lower leg, Left lower leg, Left upper leg, Right upper leg, Abdomen, Front perineal area, Buttocks     Body parts n/a: Front perineal area, Buttocks(pt completed earlier with nursing per her report)   Bathing  assist Assist Level: Contact Guard/Touching assist     Upper Body Dressing/Undressing Upper body dressing   What is the patient wearing?: Button up shirt    Upper body assist Assist Level: Supervision/Verbal cueing    Lower Body Dressing/Undressing Lower body dressing    Lower body dressing activity did not occur: N/A What is the patient wearing?: Underwear/pull up, Pants     Lower body assist Assist for lower body dressing: Contact Guard/Touching assist     Toileting Toileting    Toileting assist Assist for toileting: Contact Guard/Touching assist     Transfers Chair/bed transfer  Transfers assist  Chair/bed transfer activity did not occur: N/A  Chair/bed transfer assist level: Supervision/Verbal cueing     Locomotion Ambulation   Ambulation assist      Assist level: Supervision/Verbal cueing Assistive device: Walker-rolling Max distance: 74ft   Walk 10 feet activity   Assist     Assist level: Supervision/Verbal cueing Assistive device: Walker-rolling   Walk 50 feet activity   Assist Walk 50 feet with 2 turns activity did not occur: Safety/medical concerns(decreased strength/activity tolerance)  Assist level: Supervision/Verbal cueing Assistive device: Walker-rolling    Walk 150 feet activity   Assist Walk 150 feet activity did not occur: Safety/medical concerns(decreased strength/endurance)  Assist level: Contact Guard/Touching assist Assistive device: Walker-rolling    Walk 10 feet on uneven surface  activity   Assist Walk 10 feet on uneven surfaces activity did not occur: Safety/medical concerns(Per report)         Wheelchair     Assist Will patient use wheelchair at discharge?: Yes Type of Wheelchair: Manual    Wheelchair assist  level: Supervision/Verbal cueing Max wheelchair distance: 80'(x 2 reps)    Wheelchair 50 feet with 2 turns activity    Assist        Assist Level: Supervision/Verbal cueing    Wheelchair 150 feet activity     Assist Wheelchair 150 feet activity did not occur: Safety/medical concerns(due to poor endurance)        Medical Problem List and Plan: 1.Left-sided weaknesssecondary to small acute posterior right frontal infarction Patient at goal level, wife feels like she can manage patient daughter is concerned Medically stable for discharge in a.m.  Continue CIR PT, OT, SLP  2. Antithrombotics: -DVT/anticoagulation:Chronic Coumadin, normal hemoglobin no signs of bleeding or bruising  pharmacy protocol  -antiplatelet therapy: N/A 3. Pain Management:Tylenol as needed 4. Mood:Recent diagnosis of dementia   Fluoxetine started on 5/31 -antipsychotic agents: N/A 5. Neuropsych: This patientis?  Fully capable of making decisions on hisown behalf. 6. Skin/Wound Care:Routine skin checks 7. Fluids/Electrolytes/Nutrition:Routine in and outs 8.Atrial fibrillation. Cardiac rate controlled. Continue Coumadin. Vitals:   11/02/18 0417 11/02/18 0849  BP: (!) 136/99 (!) 151/94  Pulse: (!) 51 98  Resp: 19   Temp: 97.6 F (36.4 C)   SpO2: 100% 98%  labile 9.Diabetes mellitus with peripheral neuropathy. Check blood sugars before meals and at bedtime CBG (last 3)  Recent Labs    11/01/18 1625 11/01/18 2138 11/02/18 0623  GLUCAP 97 121* 130*   Lantus  30U, improved 10.Chronic diastolic congestive heart failure. Monitor for any signs of fluid overload. Lasix 80 mg daily.  Reduce to 60 mg because of elevation of creatinine above his baseline level currently without lower extremity edema  Daily weights ordered [facility] Weights   10/31/18 0528 10/31/18 1929 11/02/18 0417  Weight: 107.3 kg 107.2 kg 105 kg    11. Hyperlipidemia. Lipitor 12. History of tobacco use. Provide counseling 13. OSA. Continue CPAP 14.Chronic renal insufficiency with baseline creatinine 1.83-1.97.  Creatinine 1.73 on 5/29, labs ordered for  6/1, creat 2.22 elevated over baseline , reduce lasix intake ~666ml per day 15. Question alcohol use. No signs of withdrawal    16.  Hypokalemia likely due to Lasix will supplement KCl  Potassium 3.5 on 5/29, labs ordered for 6/1 show improvement   LOS: 9 days A FACE TO [place] E Kirsteins 11/02/2018, 10:57 AM

## 2018-11-02 NOTE — Plan of Care (Addendum)
Pt to d/c 06/05 home

## 2018-11-02 NOTE — Progress Notes (Deleted)
Speech Language Pathology Discharge Summary  Patient Details  Name: BRAELYN BORDONARO MRN: [id number] Date of Birth: 1934/01/10  Today's Date: 11/02/2018 SLP Individual Time: 3085-6943 SLP Individual Time Calculation (min): 30 min   Skilled Therapeutic Interventions:  Patient received skilled SLP services targeting cognitive goals. Patient was alert and oriented x4 independently. Patient independently recalled 3 tasks that he completed with physical therapy this morning. Patient required max verbal cues to respond to functional problem solving scenarios for the home environment with 75% accuracy. Patient did require encouragement to participate this date. At the end of therapy session patient was upright in wheel chair, call bell within reach, and chair alarm activated.   Patient has met 3 of 4 long term goals.  Patient to discharge at [facility] level.  Reasons goals not met: Continued difficulty with functional problem solving and reasoning tasks   Clinical Impression/Discharge Summary: Patient presents with improvements in orientation and functional memory skills for basic daily information. Patient was alert and oriented x4 this date. Patient demonstrated functional recall of therapy tasks he completed today. However, continues to have difficulty with reasoning, thought organization, and problem solving.  Care Partner:  Caregiver Able to Provide Assistance: Yes  Type of Caregiver Assistance: Cognitive  Recommendation: 24/7 supervision  Home Health SLP  Rationale for SLP Follow Up: Maximize cognitive function and independence;Reduce caregiver burden   Reasons for discharge: Discharged from hospital   Patient/Family Agrees with Progress Made and Goals Achieved: Yes    Cristy Folks 11/02/2018, 11:42 AM

## 2018-11-02 NOTE — Progress Notes (Signed)
Physical Therapy Discharge Summary  Patient Details  Name: Dennis Williams MRN: [id number] Date of Birth: [birth year]   Patient has met 10 of 11 long term goals due to improved activity tolerance, improved balance, improved postural control, increased strength and improved coordination.  Patient to discharge at an ambulatory level Supervision.   Patient's care partner unavailable to come in for family education, but will be available and able to provide the necessary supervision assistance at discharge.  Reasons goals not met: Patient did not meet his goal for supervision for furniture transfers. He currently requires min A to stand from a low couch, but is able to stand with supervision with a pillow underneath him to elevate the surface and will use this modification at home.   Recommendation:  Patient will benefit from ongoing skilled PT services in home health setting to continue to advance safe functional mobility, address ongoing impairments in balance, strength, activity tolerance, functional mobility, patient/family education and minimize fall risk.  Equipment: No equipment provided, patient has RW at home.  Reasons for discharge: treatment goals met  Patient/family agrees with progress made and goals achieved: Yes  PT Discharge Precautions/Restrictions Precautions Precautions: Fall Restrictions Weight Bearing Restrictions: No Vital Signs Therapy Vitals Pulse Rate: 98 BP: (!) 151/94 Patient Position (if appropriate): Sitting Oxygen Therapy SpO2: 98 % O2 Device: Room Air Pain Pain Assessment Pain Scale: 0-10 Pain Score: 0-No pain Vision/Perception  Vision - Assessment Eye Alignment: Within Functional Limits Ocular Range of Motion: Restricted looking up Alignment/Gaze Preference: Within Defined Limits Tracking/Visual Pursuits: Decreased smoothness of vertical tracking Convergence: Within functional limits Perception Perception: Within Functional  Limits Praxis Praxis: Intact  Cognition Overall Cognitive Status: History of cognitive impairments - at baseline Arousal/Alertness: Awake/alert Orientation Level: Oriented to person;Oriented to place;Oriented to situation;Disoriented to time(stated correct day and month, stated the year was 2004) Attention: Sustained Focused Attention: Appears intact Sustained Attention: Appears intact Selective Attention: Impaired Selective Attention Impairment: Functional complex;Verbal complex Memory: Impaired Memory Impairment: Decreased recall of new information;Decreased short term memory Decreased Short Term Memory: Verbal basic;Functional complex Problem Solving: Impaired Problem Solving Impairment: Verbal complex;Functional complex Sequencing: Impaired Sequencing Impairment: Functional complex Safety/Judgment: Impaired Sensation Sensation Light Touch: Appears Intact Proprioception: Appears Intact Coordination Gross Motor Movements are Fluid and Coordinated: No Fine Motor Movements are Fluid and Coordinated: No Coordination and Movement Description: Slower finger to nose testing in the LUE compared to the RUE Finger Nose Finger Test: slow and deliberate L>R Motor  Motor Motor: Other (comment) Motor - Skilled Clinical Observations: slight left side decreased speed and coordination compared to the right but overall no significant signs of hemiparesis Motor - Discharge Observations: coordination and activity tolerance decreased, strength WFL  Mobility Bed Mobility Bed Mobility: Rolling Right;Rolling Left;Supine to Sit Rolling Right: Independent Rolling Left: Independent Supine to Sit: Independent(requires increased time) Sit to Supine: Independent Transfers Transfers: Stand to Sit;Sit to Stand;Stand Pivot Transfers Sit to Stand: Supervision/Verbal cueing Stand to Sit: Supervision/Verbal cueing Stand Pivot Transfers: Supervision/Verbal cueing Stand Pivot Transfer Details (indicate  cue type and reason): no cuing required for proper technique with RW Transfer (Assistive device): Rolling walker Locomotion  Gait Ambulation: Yes Gait Assistance: Supervision/Verbal cueing Gait Distance (Feet): 150 Feet Assistive device: Rolling walker Gait Assistance Details: Verbal cues for technique Gait Assistance Details: verbal cues for erect posture and looking ahead Gait Gait: Yes Gait Pattern: Decreased step length - right;Decreased step length - left;Wide base of support;Step-through pattern;Decreased hip/knee flexion - right;Decreased hip/knee flexion - left;Decreased trunk rotation  Gait velocity: Decreased Stairs / Additional Locomotion Stairs: Yes Stairs Assistance: Contact Guard/Touching assist Stair Management Technique: Two rails Number of Stairs: 4 Height of Stairs: 6 Ramp: Contact Guard/touching assist Curb: Nurse, mental health Mobility: Yes Wheelchair Assistance: Chartered loss adjuster: Both upper extremities;Both lower extermities Wheelchair Parts Management: Needs assistance Distance: 150'  Trunk/Postural Assessment  Cervical Assessment Cervical Assessment: Exceptions to WFL(forward head) Thoracic Assessment Thoracic Assessment: Exceptions to WFL(rounded shoulders) Lumbar Assessment Lumbar Assessment: Exceptions to WFL(posterior pelvic tilt) Postural Control Postural Control: Deficits on evaluation Postural Limitations: decreased postural control and delayed righting reactions with functional mobility  Balance Balance Balance Assessed: Yes Static Sitting Balance Static Sitting - Balance Support: No upper extremity supported;Feet supported Static Sitting - Level of Assistance: 7: Independent Dynamic Sitting Balance Dynamic Sitting - Balance Support: No upper extremity supported;During functional activity;Feet supported Dynamic Sitting - Level of Assistance: 7: Independent Dynamic  Sitting - Balance Activities: Lateral lean/weight shifting;Forward lean/weight shifting;Reaching for objects;Reaching across midline Sitting balance - Comments: able to reach within BOS and outside of BOS  Static Standing Balance Static Standing - Balance Support: During functional activity;Bilateral upper extremity supported Static Standing - Level of Assistance: 5: Stand by assistance Dynamic Standing Balance Dynamic Standing - Balance Support: During functional activity;No upper extremity supported Dynamic Standing - Level of Assistance: 5: Stand by assistance Extremity Assessment  RUE Assessment RUE Assessment: Within Functional Limits Active Range of Motion (AROM) Comments: WFL for all functional mobility General Strength Comments: Grossly 5/5 throughout in sitting LUE Assessment LUE Assessment: Within Functional Limits Active Range of Motion (AROM) Comments: WFL for all functional mobility General Strength Comments: Grossly 5/5 throughout in sitting RLE Assessment RLE Assessment: Within Functional Limits Active Range of Motion (AROM) Comments: WFL for all functional mobility General Strength Comments: Grossly 5/5 throughout in sitting LLE Assessment LLE Assessment: Exceptions to [facility] Active Range of Motion (AROM) Comments: Limited knee ROM when compaired to R from prior TKA, but [facility] for all functional mobility General Strength Comments: Grossly 4+/5 throughout in sitting    Cherie L Grunenberg PT, DPT  11/02/2018, 12:00 PM

## 2018-11-03 DIAGNOSIS — F0390 Unspecified dementia without behavioral disturbance: Secondary | ICD-10-CM

## 2018-11-03 LAB — GLUCOSE, CAPILLARY
Glucose-Capillary: 105 mg/dL — ABNORMAL HIGH (ref 70–99)
Glucose-Capillary: 90 mg/dL (ref 70–99)
Glucose-Capillary: 116 mg/dL — ABNORMAL HIGH (ref 70–99)

## 2018-11-03 LAB — PROTIME-INR
INR: 2.9 — ABNORMAL HIGH (ref 0.8–1.2)
Prothrombin Time: 30.1 seconds — ABNORMAL HIGH (ref 11.4–15.2)

## 2018-11-03 MED ORDER — WARFARIN SODIUM 5 MG PO TABS: 5.0000 mg | ORAL_TABLET | Freq: Every day | ORAL | 0 refills | Status: AC

## 2018-11-03 NOTE — Progress Notes (Signed)
[place] PHYSICAL MEDICINE & REHABILITATION PROGRESS NOTE   Subjective/Complaints:  Patient aware of discharge today no complaints.  He is feels like he is breathing well  Review of systems: Denies CP, SOB, N/V/D  Objective:   No results found. Recent Labs    11/01/18 1313  WBC 7.4  HGB 16.1  HCT 48.0  PLT 211   No results for input(s): NA, K, CL, CO2, GLUCOSE, BUN, CREATININE, CALCIUM in the last 72 hours.  Intake/Output Summary (Last 24 hours) at 11/03/2018 0917 Last data filed at 11/03/2018 0756 Gross per 24 hour  Intake 580 ml  Output -  Net 580 ml     Physical Exam: Vital Signs Blood pressure 135/76, pulse 74, temperature 97.7 F (36.5 C), temperature source Oral, resp. rate 20, height 5\' 8"  (1.727 m), weight 104.5 kg, SpO2 98 %.   Constitutional: No distress . Vital signs reviewed. HENT: Normocephalic.  Atraumatic Eyes: EOMI.  No discharge. Cardiovascular: No JVD. Respiratory: Normal effort. GI: Non-distended. Musc: No edema or tenderness in extremities. Skin: Vascular changes bilateral lower extremities Neurologic: Alert Motor: RUE/RLE: 4+/5 proximal distal, stable LUE/LLE: 4/5 proximal distal Psych: Very flat.  Assessment/Plan: 1. Functional deficits secondary to right frontal infarct with left hemiparesis Stable for D/C today F/u PCP in 3-4 weeks F/u PM&R 2 weeks See D/C summary See D/C instructions Care Tool:  Bathing  Bathing activity did not occur: Refused Body parts bathed by patient: Right arm, Left arm, Chest, Face, Right lower leg, Left lower leg, Left upper leg, Right upper leg, Abdomen, Front perineal area, Buttocks(per staff report)     Body parts n/a: Front perineal area, Buttocks(pt completed earlier with nursing per her report)   Bathing assist Assist Level: Supervision/Verbal cueing     Upper Body Dressing/Undressing Upper body dressing   What is the patient wearing?: Button up shirt    Upper body assist Assist Level:  Supervision/Verbal cueing    Lower Body Dressing/Undressing Lower body dressing    Lower body dressing activity did not occur: N/A What is the patient wearing?: Underwear/pull up, Pants     Lower body assist Assist for lower body dressing: Supervision/Verbal cueing     Toileting Toileting    Toileting assist Assist for toileting: Supervision/Verbal cueing     Transfers Chair/bed transfer  Transfers assist  Chair/bed transfer activity did not occur: N/A  Chair/bed transfer assist level: Supervision/Verbal cueing     Locomotion Ambulation   Ambulation assist      Assist level: Supervision/Verbal cueing Assistive device: Walker-rolling Max distance: 150'   Walk 10 feet activity   Assist     Assist level: Supervision/Verbal cueing Assistive device: Walker-rolling   Walk 50 feet activity   Assist Walk 50 feet with 2 turns activity did not occur: Safety/medical concerns(decreased strength/activity tolerance)  Assist level: Supervision/Verbal cueing Assistive device: Walker-rolling    Walk 150 feet activity   Assist Walk 150 feet activity did not occur: Safety/medical concerns(decreased strength/endurance)  Assist level: Supervision/Verbal cueing Assistive device: Walker-rolling    Walk 10 feet on uneven surface  activity   Assist Walk 10 feet on uneven surfaces activity did not occur: Safety/medical concerns(Per report)   Assist level: Supervision/Verbal cueing Assistive device: Aeronautical engineer Will patient use wheelchair at discharge?: No Type of Wheelchair: Manual    Wheelchair assist level: Supervision/Verbal cueing Max wheelchair distance: 100'    Wheelchair 50 feet with 2 turns activity    Assist  Assist Level: Supervision/Verbal cueing   Wheelchair 150 feet activity     Assist Wheelchair 150 feet activity did not occur: Safety/medical concerns(decreased endurance)         Medical Problem List and Plan: 1.Left-sided weaknesssecondary to small acute posterior right frontal infarction Stable for discharge today   Continue CIR PT, OT, SLP  2. Antithrombotics: -DVT/anticoagulation:Chronic Coumadin, normal hemoglobin no signs of bleeding or bruising  pharmacy protocol  -antiplatelet therapy: N/A 3. Pain Management:Tylenol as needed 4. Mood:Recent diagnosis of dementia   Fluoxetine started on 5/31 -antipsychotic agents: N/A 5. Neuropsych: This patientis?  Fully capable of making decisions on hisown behalf. 6. Skin/Wound Care:Routine skin checks 7. Fluids/Electrolytes/Nutrition:Routine in and outs 8.Atrial fibrillation. Cardiac rate controlled. Continue Coumadin. Vitals:   11/02/18 1955 11/03/18 0417  BP: 130/85 135/76  Pulse: 88 74  Resp: 20 20  Temp: 98.2 F (36.8 C) 97.7 F (36.5 C)  SpO2: 97% 98%  labile 9.Diabetes mellitus with peripheral neuropathy. Check blood sugars before meals and at bedtime CBG (last 3)  Recent Labs    11/02/18 2302 11/03/18 0359 11/03/18 0611  GLUCAP 129* 105* 90   Lantus  30U, improved 10.Chronic diastolic congestive heart failure. Monitor for any signs of fluid overload. Lasix 80 mg daily.  Reduce to 60 mg because of elevation of creatinine above his baseline level currently without lower extremity edema  Daily weights ordered Filed Weights   10/31/18 1929 11/02/18 0417 11/03/18 0417  Weight: 107.2 kg 105 kg 104.5 kg    11. Hyperlipidemia. Lipitor 12. History of tobacco use. Provide counseling 13. OSA. Continue CPAP 14.Chronic renal insufficiency with baseline creatinine 1.83-1.97.  Creatinine 1.73 on 5/29, labs ordered for 6/1, creat 2.22 elevated over baseline , reduce lasix intake ~671ml per day  Follow-up with cardiology as an outpatient 15. Question alcohol use. No signs of withdrawal    16.  Hypokalemia likely due to Lasix will supplement  KCl  Potassium 3.5 on 5/29, labs ordered for 6/1 show improvement   LOS: 10 days A FACE TO FACE EVALUATION WAS PERFORMED  Charlett Blake 11/03/2018, 9:17 AM

## 2018-11-03 NOTE — Progress Notes (Signed)
Patient refused CPAP for third consecutive night.  Order d/c'd.

## 2018-11-03 NOTE — Progress Notes (Signed)
Speech Language Pathology Discharge Summary  Patient Details  Name: Dennis Williams MRN: [id number] Date of Birth: 04-07-1934   This writer spoke with pt's wife and daughter via phone on this date to answer any questions they might have regarding cognitive function. They didn't have any questions and pt's wife was distracted by current medications that pt was taking. This Probation officer explained that pt required 24 hour supervision and provided examples of how pt's cognitive deficits might manifest themselves in ADLs.      Patient has met 3 of 4 long term goals.  Patient to discharge at overall Min;Mod level.  Reasons goals not met: Continued difficulty with functional problem solving and reasoning tasks   Clinical Impression/Discharge Summary: Patient presents with improvements in orientation and memory skills. Patient was alert and oriented x4 this date. Patient demonstrated functional recall of therapy tasks he completed today. However, continues to have difficulty with reasoning, thought organization, and problem solving.  Suspect that pt will demonstrate increased functional ability within home environment.   Care Partner:  Caregiver Able to Provide Assistance: Yes  Type of Caregiver Assistance: Cognitive;Physical  Recommendation:  Home Health SLP;24 hour supervision/assistance  Rationale for SLP Follow Up: Maximize cognitive function and independence;Reduce caregiver burden   Equipment:   N/A  Reasons for discharge: Discharged from hospital   Patient/Family Agrees with Progress Made and Goals Achieved: Yes    Renley Banwart 11/03/2018, 2:57 PM

## 2018-11-03 NOTE — Progress Notes (Signed)
Pt d/c to home with personal belongings and equipment.  D/C instructions provided to family via phone by Silvestre Mesi, [place]. Pt verbalized an understanding of instructions, medications, and f/u appts.

## 2018-11-03 NOTE — Discharge Instructions (Signed)
Inpatient Rehab Discharge Instructions  Dennis Williams Discharge date and time: No discharge date for patient encounter.   Activities/Precautions/ Functional Status: Activity: activity as tolerated Diet: diabetic diet Wound Care: none needed Functional status:  ___ No restrictions     ___ Walk up steps independently ___ 24/7 supervision/assistance   ___ Walk up steps with assistance ___ Intermittent supervision/assistance  ___ Bathe/dress independently ___ Walk with walker     _x__ Bathe/dress with assistance ___ Walk Independently    ___ Shower independently ___ Walk with assistance    ___ Shower with assistance ___ No alcohol     ___ Return to work/school ________  COMMUNITY REFERRALS UPON DISCHARGE:   Home Health:   PT     OT   Agency:  [facility] Phone:  [phone] Medical Equipment/Items Ordered:  Tub bench and bedside commode  Agency/Supplier: [employer]        Phone:  [phone]  GENERAL COMMUNITY RESOURCES FOR PATIENT/FAMILY: Support Groups:  [facility] Stroke Support Group - on hold for COVID-19                              Meets the second Thursday of each month from 6 - 7 PM (except June, July, August)                              [facility], [place], [place]                               For more information, call [phone]  Special Instructions: No driving smoking or alcohol   Home health nurse to check INR on 11/07/2018    results to Dr Janace Litten  [phone] or  fax number [phone]    STROKE/TIA [place] Cigarette smoking nearly doubles your risk of having a stroke & is the single most alterable risk factor  If you smoke or have smoked in the last 12 months, you are advised to quit smoking for your health.  Most of the excess cardiovascular risk related to smoking disappears within a year of stopping.  Ask you doctor about  anti-smoking medications  [place] Quit Line: 1-800-QUIT NOW  Free Smoking Cessation Classes [phone]  CHOLESTEROL Know your levels; limit fat & cholesterol in your diet  Lipid Panel     Component Value Date/Time   CHOL 212 (H) 10/23/2018 0516   TRIG 140 10/23/2018 0516   HDL 27 (L) 10/23/2018 0516   CHOLHDL 7.9 10/23/2018 0516   VLDL 28 10/23/2018 0516   LDLCALC 157 (H) 10/23/2018 0516      Many patients benefit from treatment even if their cholesterol is at goal.  Goal: Total Cholesterol (CHOL) less than 160  Goal:  Triglycerides (TRIG) less than 150  Goal:  HDL greater than 40  Goal:  LDL (LDLCALC) less than 100   BLOOD PRESSURE American Stroke Association blood pressure target is less that 120/80 mm/Hg  Your discharge blood pressure is:  BP: 139/74  Monitor your blood pressure  Limit your salt and alcohol intake  Many individuals will require more than one medication for high blood pressure  DIABETES (A1c is a blood sugar average for last 3 months) Goal HGBA1c is under 7% (HBGA1c is blood sugar average  for last 3 months)  Diabetes:    Lab Results  Component Value Date   HGBA1C 8.1 (H) 11/22/2016     Your HGBA1c can be lowered with medications, healthy diet, and exercise.  Check your blood sugar as directed by your physician  Call your physician if you experience unexplained or low blood sugars.  PHYSICAL ACTIVITY/REHABILITATION Goal is 30 minutes at least 4 days per week  Activity: Increase activity slowly, Therapies: Physical Therapy: Home Health Return to work:   Activity decreases your risk of heart attack and stroke and makes your heart stronger.  It helps control your weight and blood pressure; helps you relax and can improve your mood.  Participate in a regular exercise program.  Talk with your doctor about the best form of exercise for you (dancing, walking, swimming, cycling).  DIET/WEIGHT Goal is to maintain a healthy weight  Your discharge diet  is:  Diet Order            Diet Carb Modified Fluid consistency: Thin; Room service appropriate? Yes with Assist  Diet effective now              liquids Your height is:  Height: 5\' 8"  (172.7 cm) Your current weight is: Weight: 109.2 kg Your Body Mass Index (BMI) is:  BMI (Calculated): 36.62  Following the type of diet specifically designed for you will help prevent another stroke.  Your goal weight range is:    Your goal Body Mass Index (BMI) is 19-24.  Healthy food habits can help reduce 3 risk factors for stroke:  High cholesterol, hypertension, and excess weight.  RESOURCES Stroke/Support Group:  Call [phone]   STROKE EDUCATION PROVIDED/REVIEWED AND GIVEN TO PATIENT Stroke warning signs and symptoms How to activate emergency medical system (call 911). Medications prescribed at discharge. Need for follow-up after discharge. Personal risk factors for stroke. Pneumonia vaccine given:  Flu vaccine given:  My questions have been answered, the writing is legible, and I understand these instructions.  I will adhere to these goals & educational materials that have been provided to me after my discharge from the hospital.      My questions have been answered and I understand these instructions. I will adhere to these goals and the provided educational materials after my discharge from the hospital.  Patient/Caregiver Signature _______________________________ Date __________  Clinician Signature _______________________________________ Date __________  Please bring this form and your medication list with you to all your follow-up doctor's appointments.

## 2018-11-04 NOTE — Progress Notes (Signed)
Social Work Discharge Note  The overall goal for the admission was met for:   Discharge location: Yes  Length of Stay: Yes - 10 days  Discharge activity level: Yes - supervision  Home/community participation: Yes  Services provided included: MD, RD, PT, OT, SLP, RN, Pharmacy, Neuropsych and SW  Financial Services: Private Insurance: [employer]  Follow-up services arranged: Home Health: PT/OT from [facility], DME: 3-in-1 commode; tub transfer bench from [employer] and Patient/Family has no preference for HH/DME agencies  Comments (or additional information): CSW talked with pt's dtr on day of d/c and the family has decided how to provide support for pt and his wife at their home.  Patient/Family verbalized understanding of follow-up arrangements: Yes  Individual responsible for coordination of the follow-up plan: pt's wife and dtr, Jaire Pinkham - wife - [phone] (h); [phone] (m)   Lyndle Herrlich - dtr - [phone]  Confirmed correct DME delivered: Trey Sailors 11/04/2018    Landri Dorsainvil, Silvestre Mesi

## 2018-11-04 NOTE — Progress Notes (Signed)
Social Work Patient ID: Dennis Williams, male   DOB: 04/28/1934, 85 y.o.   MRN: [id number]   CSW talked with pt and then his wife via telephone on 11-01-18 to update them on team conference discussion and targeted d/c date of 11-03-18.  Then, talked with pt's dtr, Lyndle Herrlich on 11-02-18 about her concerns for pt coming home.  CSW suggested she and her sister talk about how they can support their parents and call CSW the next day to see what their plans are.  Told her that pt is S level and will not meet SNF criteria, nor would insurance approval the stay.  She stated that pt would refuse to go.  CSW told her that assisted living and caregivers are private pay.  She expressed understanding.  CSW will support pt/family as they need.

## 2018-11-06 ENCOUNTER — Telehealth: Payer: Self-pay

## 2018-11-06 NOTE — Telephone Encounter (Signed)
  TRANSITIONAL CARE CALL  Patient name: [name], Dennis Williams)  DOB: ([birth year])      1. Are you/is patient experiencing any problems since coming home? (NO)  a. Are there any questions regarding any aspect of care? (NO)   2. Are there any questions regarding medications administration/dosing? (NO)  a. Are meds being taken as prescribed? (YES)   3. Have there been any falls? (NO)   4. Has Home Health been to the house and/or have they contacted you? (YES)  a. If not, have you tried to contact them? (NA)  b. Can we help you contact them? (NA)   5. Are bowels and bladder emptying properly? (YES)  a. Are there any unexpected incontinence issues? (NO)  b. If applicable, is patient following bowel/bladder programs? (NA)   6. Any fevers, problems with breathing, or unexpected pain? (NO)    7. Are there any skin problems or new areas of breakdown? (NO)   8. Has the patient/family member arranged specialty MD follow up? (ie, cardiology/neuro) ()  a. Can we help arrange? (YES)   9. Does the patient need any other services or support that we can help arrange? (NO)   10. Are caregivers following through as expected in assisting the patient? (YES)   11. Has the patient quit smoking, drinking alcohol, or using drugs as recommended? (NA)   Appointment date/time: (11-14-2018 / 120pm)  Arrive time: (100pm)  With: Zella Ball first then Dr. Letta Pate)   [place] [facility] [place]   [phone]

## 2018-11-14 ENCOUNTER — Encounter: Payer: Self-pay | Admitting: Registered Nurse

## 2018-11-14 ENCOUNTER — Encounter: Payer: Medicare Other | Attending: Registered Nurse | Admitting: Registered Nurse

## 2018-11-14 ENCOUNTER — Encounter: Payer: Medicare Other | Admitting: Registered Nurse

## 2018-11-14 ENCOUNTER — Other Ambulatory Visit: Payer: Self-pay

## 2018-11-14 VITALS — BP 104/74 | HR 84 | Temp 97.6°F | Resp 14 | Ht 68.0 in | Wt 232.0 lb

## 2018-11-14 DIAGNOSIS — I5032 Chronic diastolic (congestive) heart failure: Secondary | ICD-10-CM | POA: Insufficient documentation

## 2018-11-14 DIAGNOSIS — E1142 Type 2 diabetes mellitus with diabetic polyneuropathy: Secondary | ICD-10-CM | POA: Diagnosis not present

## 2018-11-14 DIAGNOSIS — I639 Cerebral infarction, unspecified: Secondary | ICD-10-CM | POA: Insufficient documentation

## 2018-11-14 DIAGNOSIS — I4891 Unspecified atrial fibrillation: Secondary | ICD-10-CM

## 2018-11-14 DIAGNOSIS — E7849 Other hyperlipidemia: Secondary | ICD-10-CM | POA: Insufficient documentation

## 2018-11-14 DIAGNOSIS — F0390 Unspecified dementia without behavioral disturbance: Secondary | ICD-10-CM | POA: Diagnosis present
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
## 2018-12-21 ENCOUNTER — Encounter: Payer: Medicare Other | Admitting: Physical Medicine & Rehabilitation

## 2019-01-28 IMAGING — MR MR FEMUR*L* W/O CM
4 of 6 series · 18 of 40 positions shown · non-contrast
Comparison: None.

CLINICAL DATA: S/P left knee replacement 7 days ago. Re-admitted
with edema and pain to left lower leg. Has scattered serum filled
blisters to anterior and medial lower left leg.

EXAM:
MRI OF LOWER LEFT EXTREMITY WITHOUT CONTRAST; MR OF THE LEFT FEMUR
WITHOUT CONTRAST
TECHNIQUE: Multiplanar, multisequence MR imaging of the left thigh was
performed. No intravenous contrast was administered.
Multiplanar, multisequence MR imaging of the left lower leg was

[Series 2: T1 · coronal · 5.0mm · 0.86mm/px · 4 of 26 slices shown (1 of 3)]
[im 1/26]
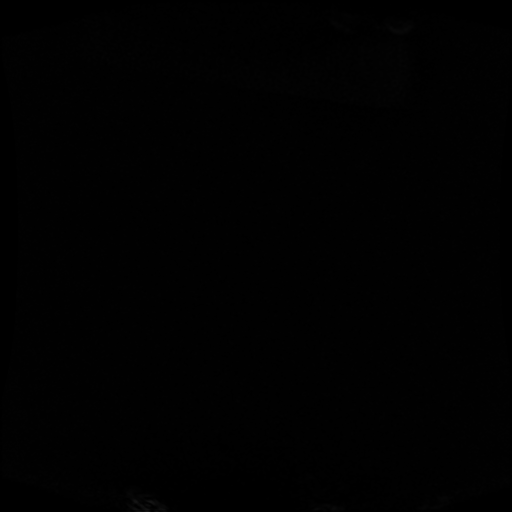
[im 9/26]
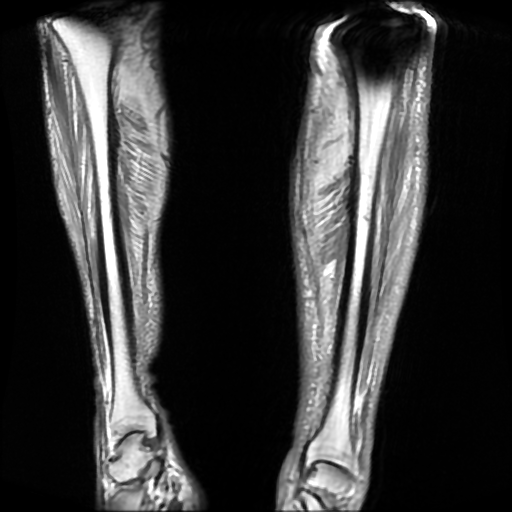
[im 17/26]
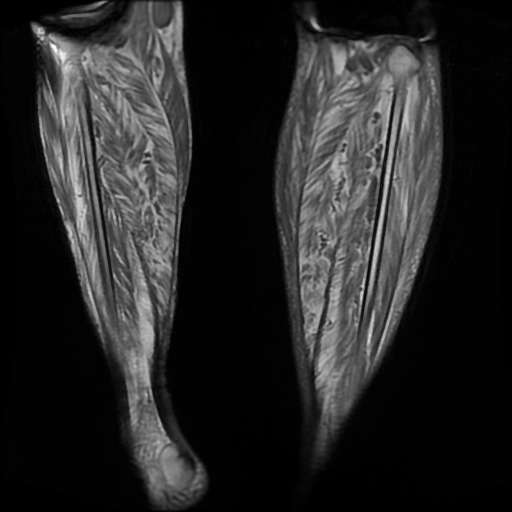
[im 26/26]
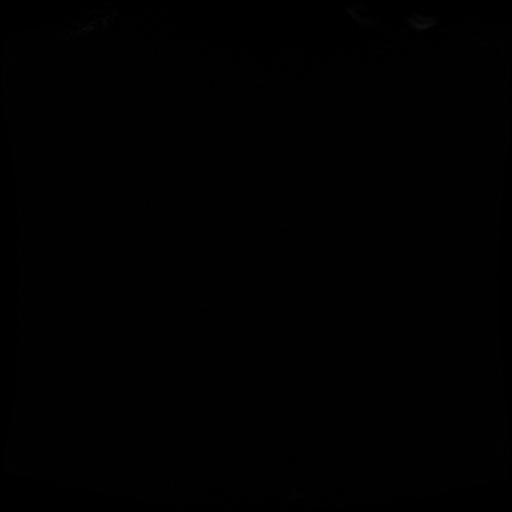

[Series 4: T1 · axial · 7.0mm · 0.76mm/px · z∈[-151,+190]mm · 3 of 51 slices shown (2 of 3)]
[im 6/51]
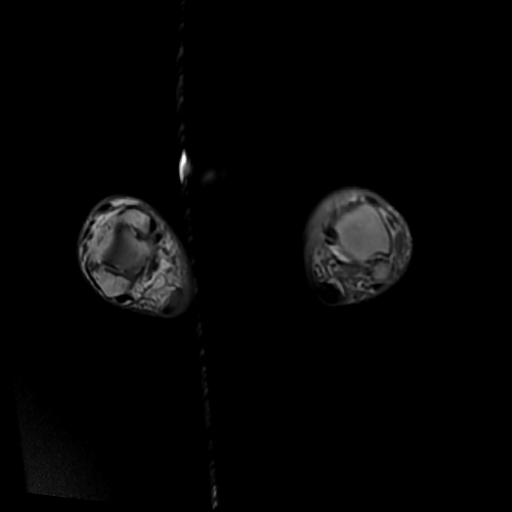
[im 28/51]
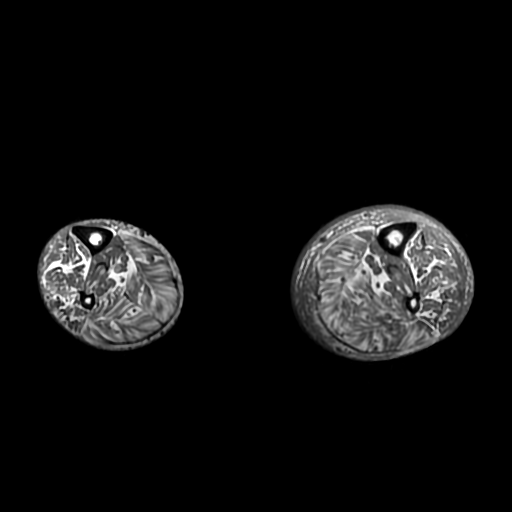
[im 45/51]
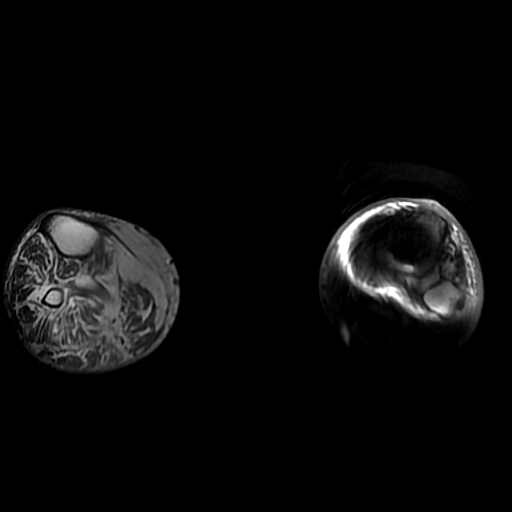

[Series 5: T2 fat-sat · axial · 7.0mm · 0.76mm/px · z∈[-195,+242]mm · 8 of 51 slices shown]
[im 1/51]
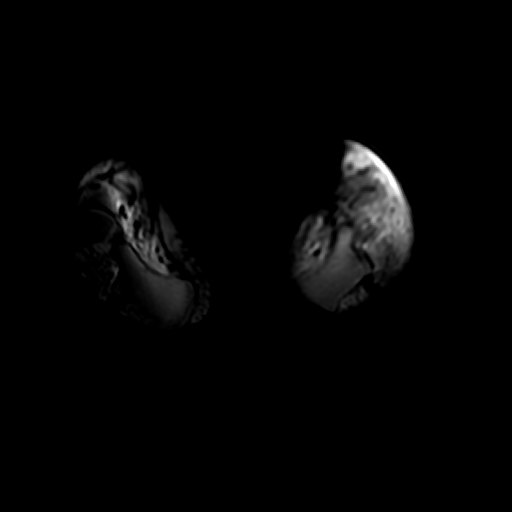
[im 6/51]
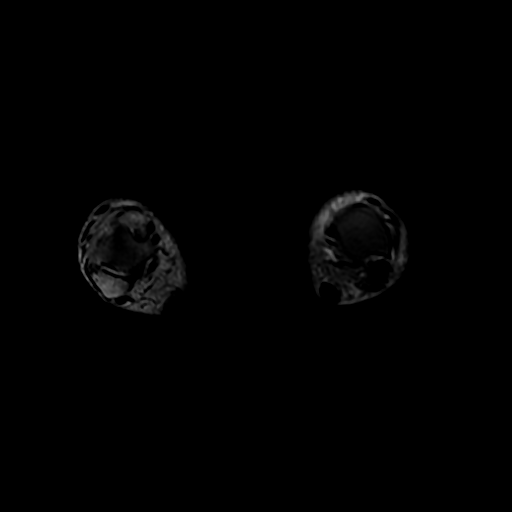
[im 17/51]
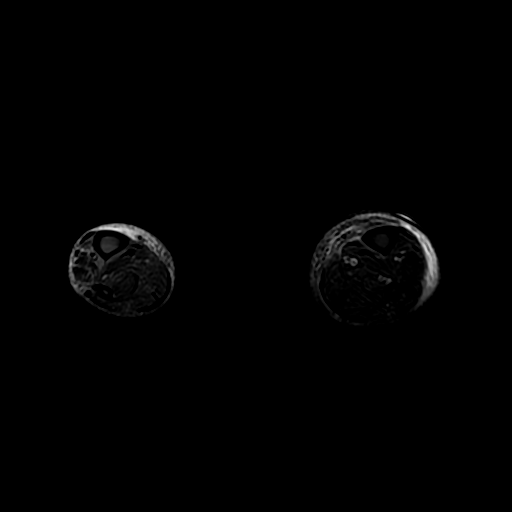
[im 23/51]
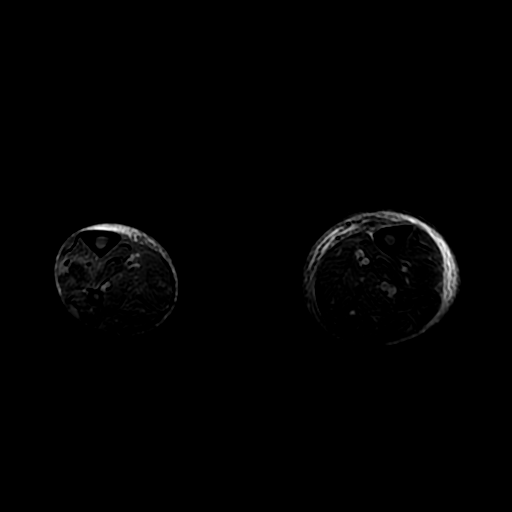
[im 28/51]
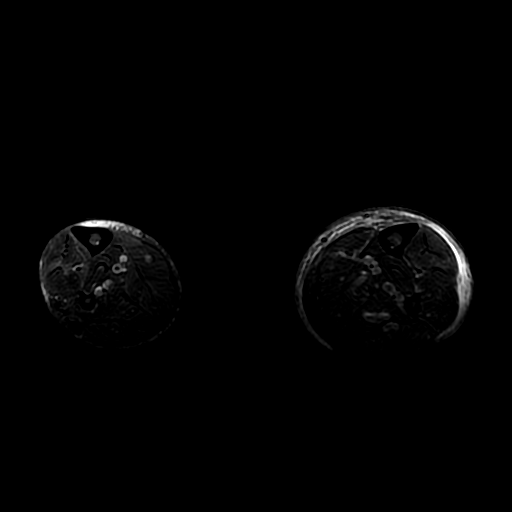
[im 34/51]
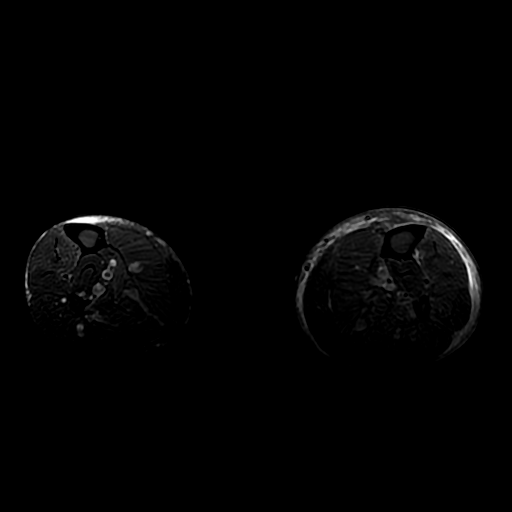
[im 45/51]
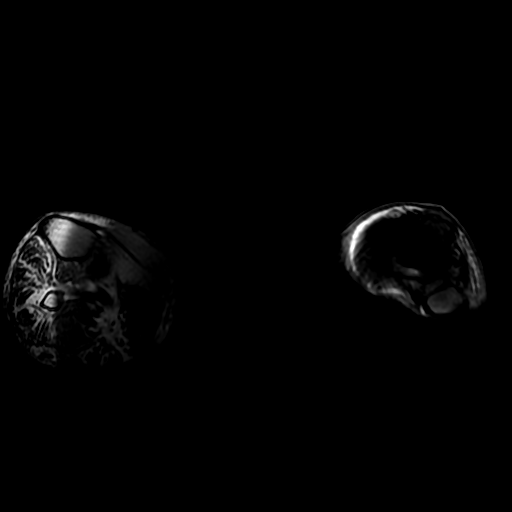
[im 51/51]
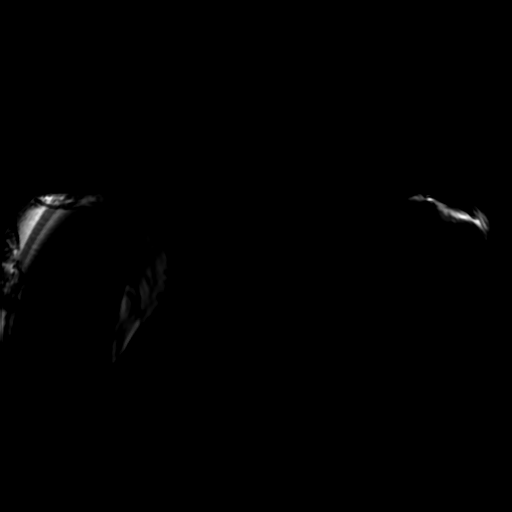

[Series 6: T1 · sagittal · 5.0mm · 0.86mm/px · 3 of 30 slices shown (3 of 3)]
[im 6/30]
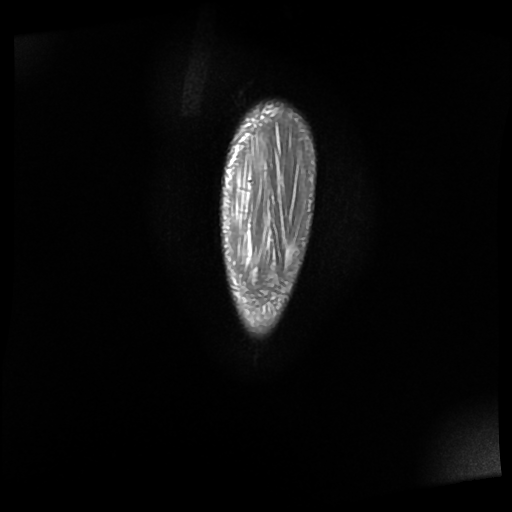
[im 18/30]
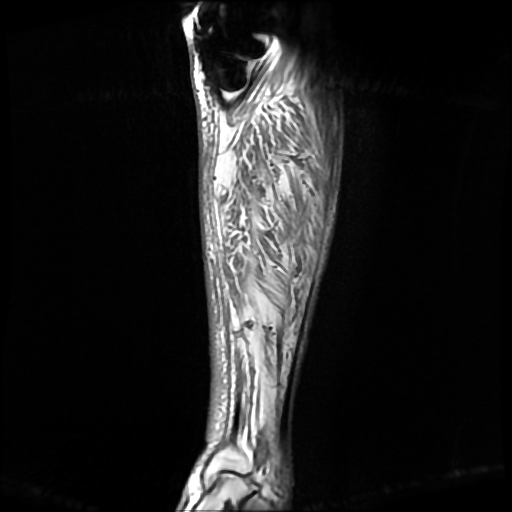
[im 30/30]
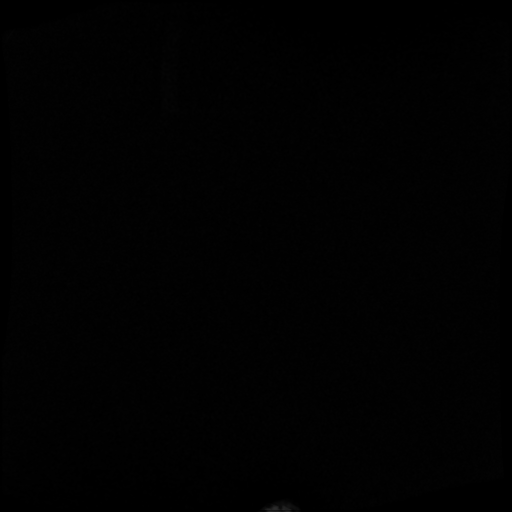

[18 of 40 positions shown; findings below may reference images not displayed]

FINDINGS: Bones/Joint/Cartilage

Left total knee arthroplasty with susceptibility artifact partially
obscuring the adjacent soft tissue and osseous structures.

Right total knee arthroplasty with susceptibility artifact partially
obscuring the adjacent soft tissue and osseous structures.

No focal marrow signal abnormality. No acute fracture or
dislocation. Normal alignment. Small left knee joint effusion. Small
right Baker cyst.

T2 hyperintense distal left femoral diaphysis bone lesion likely
reflecting a benign chondroid lesion such as an enchondroma.

Small amount fluid in the pubic symphysis.

Ligaments

Collateral ligaments are intact.

Muscles and Tendons
Mild diffuse generalized muscular atrophy. Mild muscle edema in the
distal aspect of the left vastus medialis, vastus lateralis and
vastus intermedius muscles likely reflecting postsurgical changes.
No other muscle signal abnormality. No intramuscular fluid
collection or hematoma. 2.2 x 3.2 cm T1 hyperintense mass in the
superior aspect of the right Rekha Kofi muscle with complete signal
dropout on fat saturated images most consistent with a simple
lipoma.

Soft tissue
No fluid collection or hematoma. No soft tissue mass. Generalized
edema in the subcutaneous fat throughout the left thigh and left
lower leg. Mild soft tissue edema in the subcutaneous fat of the
medial aspect of the right lower leg.

Skin blister seen on physical exam in the medial left lower leg are
not well delineated on the MRI.
IMPRESSION: 1. Generalized soft tissue edema and throughout the left thigh and
lower leg of uncertain etiology. These may reflect postsurgical
changes given recent arthroplasty versus lymph edema or cellulitis.
2. No drainable fluid collection or hematoma.
3. Skin blister seen on physical exam in the medial left lower leg
are not well delineated on the MRI.

## 2019-02-12 DIAGNOSIS — Z8673 Personal history of transient ischemic attack (TIA), and cerebral infarction without residual deficits: Secondary | ICD-10-CM | POA: Insufficient documentation

## 2019-04-04 DIAGNOSIS — L03032 Cellulitis of left toe: Secondary | ICD-10-CM | POA: Insufficient documentation

## 2019-04-11 DIAGNOSIS — Z4889 Encounter for other specified surgical aftercare: Secondary | ICD-10-CM | POA: Insufficient documentation

## 2019-04-19 DIAGNOSIS — B351 Tinea unguium: Secondary | ICD-10-CM | POA: Insufficient documentation

## 2019-05-21 DIAGNOSIS — F331 Major depressive disorder, recurrent, moderate: Secondary | ICD-10-CM | POA: Insufficient documentation

## 2019-06-12 ENCOUNTER — Other Ambulatory Visit (HOSPITAL_COMMUNITY): Payer: Self-pay | Admitting: Interventional Radiology

## 2019-06-12 DIAGNOSIS — I70229 Atherosclerosis of native arteries of extremities with rest pain, unspecified extremity: Secondary | ICD-10-CM

## 2019-06-12 DIAGNOSIS — I998 Other disorder of circulatory system: Secondary | ICD-10-CM

## 2019-06-13 DIAGNOSIS — L97522 Non-pressure chronic ulcer of other part of left foot with fat layer exposed: Secondary | ICD-10-CM | POA: Insufficient documentation

## 2019-06-20 ENCOUNTER — Other Ambulatory Visit: Payer: Self-pay | Admitting: Student

## 2019-06-20 ENCOUNTER — Other Ambulatory Visit: Payer: Self-pay | Admitting: Radiology

## 2019-06-21 ENCOUNTER — Ambulatory Visit (HOSPITAL_COMMUNITY)
Admission: RE | Admit: 2019-06-21 | Discharge: 2019-06-21 | Disposition: A | Discharge status: Home / Self Care | Payer: Medicare Other | Source: Ambulatory Visit | Attending: Interventional Radiology | Admitting: Interventional Radiology

## 2019-06-21 ENCOUNTER — Encounter (HOSPITAL_COMMUNITY): Payer: Self-pay

## 2019-06-21 ENCOUNTER — Other Ambulatory Visit: Payer: Self-pay

## 2019-06-21 ENCOUNTER — Other Ambulatory Visit (HOSPITAL_COMMUNITY): Payer: Self-pay | Admitting: Interventional Radiology

## 2019-06-21 DIAGNOSIS — Z794 Long term (current) use of insulin: Secondary | ICD-10-CM | POA: Diagnosis not present

## 2019-06-21 DIAGNOSIS — Z8249 Family history of ischemic heart disease and other diseases of the circulatory system: Secondary | ICD-10-CM | POA: Diagnosis not present

## 2019-06-21 DIAGNOSIS — I4821 Permanent atrial fibrillation: Secondary | ICD-10-CM | POA: Diagnosis not present

## 2019-06-21 DIAGNOSIS — M1990 Unspecified osteoarthritis, unspecified site: Secondary | ICD-10-CM | POA: Insufficient documentation

## 2019-06-21 DIAGNOSIS — I70229 Atherosclerosis of native arteries of extremities with rest pain, unspecified extremity: Secondary | ICD-10-CM

## 2019-06-21 DIAGNOSIS — Z96652 Presence of left artificial knee joint: Secondary | ICD-10-CM | POA: Insufficient documentation

## 2019-06-21 DIAGNOSIS — Z7901 Long term (current) use of anticoagulants: Secondary | ICD-10-CM | POA: Diagnosis not present

## 2019-06-21 DIAGNOSIS — Z79899 Other long term (current) drug therapy: Secondary | ICD-10-CM | POA: Diagnosis not present

## 2019-06-21 DIAGNOSIS — I998 Other disorder of circulatory system: Secondary | ICD-10-CM

## 2019-06-21 DIAGNOSIS — I10 Essential (primary) hypertension: Secondary | ICD-10-CM | POA: Insufficient documentation

## 2019-06-21 DIAGNOSIS — I70245 Atherosclerosis of native arteries of left leg with ulceration of other part of foot: Secondary | ICD-10-CM | POA: Diagnosis not present

## 2019-06-21 DIAGNOSIS — N289 Disorder of kidney and ureter, unspecified: Secondary | ICD-10-CM | POA: Insufficient documentation

## 2019-06-21 DIAGNOSIS — Z885 Allergy status to narcotic agent status: Secondary | ICD-10-CM | POA: Diagnosis not present

## 2019-06-21 DIAGNOSIS — E785 Hyperlipidemia, unspecified: Secondary | ICD-10-CM | POA: Insufficient documentation

## 2019-06-21 DIAGNOSIS — I2510 Atherosclerotic heart disease of native coronary artery without angina pectoris: Secondary | ICD-10-CM | POA: Insufficient documentation

## 2019-06-21 DIAGNOSIS — L97529 Non-pressure chronic ulcer of other part of left foot with unspecified severity: Secondary | ICD-10-CM | POA: Diagnosis not present

## 2019-06-21 DIAGNOSIS — E11621 Type 2 diabetes mellitus with foot ulcer: Secondary | ICD-10-CM | POA: Diagnosis present

## 2019-06-21 HISTORY — PX: IR ANGIOGRAM EXTREMITY LEFT: IMG651

## 2019-06-21 HISTORY — PX: IR TIB-PERO ART ATHEREC INC PTA MOD SED: IMG2314

## 2019-06-21 HISTORY — PX: IR US GUIDE VASC ACCESS LEFT: IMG2389

## 2019-06-21 LAB — BASIC METABOLIC PANEL
Anion gap: 11 (ref 5–15)
BUN: 31 mg/dL — ABNORMAL HIGH (ref 8–23)
CO2: 25 mmol/L (ref 22–32)
Calcium: 9 mg/dL (ref 8.9–10.3)
Chloride: 106 mmol/L (ref 98–111)
Creatinine, Ser: 1.66 mg/dL — ABNORMAL HIGH (ref 0.61–1.24)
GFR calc Af Amer: 44 mL/min — ABNORMAL LOW (ref >60)
GFR calc non Af Amer: 38 mL/min — ABNORMAL LOW (ref >60)
Glucose, Bld: 172 mg/dL — ABNORMAL HIGH (ref 70–99)
Potassium: 3.3 mmol/L — ABNORMAL LOW (ref 3.5–5.1)
Sodium: 142 mmol/L (ref 135–145)

## 2019-06-21 LAB — PROTIME-INR
INR: 1.4 — ABNORMAL HIGH (ref 0.8–1.2)
Prothrombin Time: 16.8 seconds — ABNORMAL HIGH (ref 11.4–15.2)

## 2019-06-21 LAB — CBC
HCT: 39.3 % (ref 39.0–52.0)
Hemoglobin: 13 g/dL (ref 13.0–17.0)
MCH: 30.7 pg (ref 26.0–34.0)
MCHC: 33.1 g/dL (ref 30.0–36.0)
MCV: 92.7 fL (ref 80.0–100.0)
Platelets: 163 10*3/uL (ref 150–400)
RBC: 4.24 MIL/uL (ref 4.22–5.81)
RDW: 14.7 % (ref 11.5–15.5)
WBC: 6.2 10*3/uL (ref 4.0–10.5)
nRBC: 0 % (ref 0.0–0.2)

## 2019-06-21 MED ORDER — MIDAZOLAM HCL 2 MG/2ML IJ SOLN
INTRAMUSCULAR | Status: AC | PRN
  Administered 2019-06-21: 0.5 mg via INTRAVENOUS

## 2019-06-21 MED ORDER — LIDOCAINE HCL 1 % IJ SOLN
INTRAMUSCULAR | Status: AC
  Filled 2019-06-21: qty 20

## 2019-06-21 MED ORDER — PROTAMINE SULFATE 10 MG/ML IV SOLN
50.0000 mg | Freq: Once | INTRAVENOUS | Status: DC
  Filled 2019-06-21: qty 5

## 2019-06-21 MED ORDER — HEPARIN SODIUM (PORCINE) 1000 UNIT/ML IJ SOLN
INTRAMUSCULAR | Status: AC
  Filled 2019-06-21: qty 1

## 2019-06-21 MED ORDER — CLOPIDOGREL BISULFATE 300 MG PO TABS
ORAL_TABLET | ORAL | Status: AC
  Filled 2019-06-21: qty 1

## 2019-06-21 MED ORDER — PROTAMINE SULFATE 10 MG/ML IV SOLN
INTRAVENOUS | Status: AC | PRN
  Administered 2019-06-21: 50 mg via INTRAVENOUS

## 2019-06-21 MED ORDER — FENTANYL CITRATE (PF) 100 MCG/2ML IJ SOLN
INTRAMUSCULAR | Status: AC
  Filled 2019-06-21: qty 2

## 2019-06-21 MED ORDER — SODIUM CHLORIDE 0.9 % IV SOLN: INTRAVENOUS | Status: DC

## 2019-06-21 MED ORDER — NITROGLYCERIN 1 MG/10 ML FOR IR/CATH LAB
INTRA_ARTERIAL | Status: AC | PRN
  Administered 2019-06-21: 200 ug via INTRA_ARTERIAL
  Administered 2019-06-21: 100 ug via INTRA_ARTERIAL
  Administered 2019-06-21: 200 ug via INTRA_ARTERIAL
  Administered 2019-06-21: 100 ug via INTRA_ARTERIAL

## 2019-06-21 MED ORDER — MIDAZOLAM HCL 2 MG/2ML IJ SOLN
INTRAMUSCULAR | Status: AC
  Filled 2019-06-21: qty 2

## 2019-06-21 MED ORDER — FENTANYL CITRATE (PF) 100 MCG/2ML IJ SOLN
INTRAMUSCULAR | Status: AC | PRN
  Administered 2019-06-21: 25 ug via INTRAVENOUS

## 2019-06-21 MED ORDER — ASPIRIN EC 325 MG PO TBEC
DELAYED_RELEASE_TABLET | ORAL | Status: AC
  Filled 2019-06-21: qty 2

## 2019-06-21 MED ORDER — CLOPIDOGREL BISULFATE 75 MG PO TABS
300.0000 mg | ORAL_TABLET | Freq: Once | ORAL | Status: AC
  Administered 2019-06-21: 300 mg via ORAL

## 2019-06-21 MED ORDER — ASPIRIN 325 MG PO TABS
650.0000 mg | ORAL_TABLET | Freq: Once | ORAL | Status: AC
  Administered 2019-06-21: 650 mg via ORAL

## 2019-06-21 MED ORDER — HEPARIN SODIUM (PORCINE) 1000 UNIT/ML IJ SOLN
INTRAMUSCULAR | Status: AC | PRN
  Administered 2019-06-21: 10000 [IU] via INTRAVENOUS

## 2019-06-21 MED ORDER — IODIXANOL 320 MG/ML IV SOLN
100.0000 mL | Freq: Once | INTRAVENOUS | Status: AC | PRN
  Administered 2019-06-21: 50 mL via INTRA_ARTERIAL

## 2019-06-21 MED ORDER — NITROGLYCERIN 1 MG/10 ML FOR IR/CATH LAB
INTRA_ARTERIAL | Status: AC
  Filled 2019-06-21: qty 10

## 2019-06-21 NOTE — Procedures (Signed)
Interventional Radiology Progress Note  Procedure: US guided access left CFA for left LE angiogram and revascularization of occluded AT in the mid-segment, with orbital atherectomy and balloon angioplasty of AT, 3.44m and 3.563m with no residual stenosis. Deployment of 100F angioseal.   Findings: Fem-pop segment patent with no stenosis Trifurcation patent PT is diseased distally, with no significant flow into the plantar arteries via the PT AT is occluded in the mid-segment with reconstitution by the peroneal artery by anterior communicating Peroneal artery patent  Final angio with patent in-line flow to the forefoot via the AT, with no residual stenosis  Complications: None  Recommendations:  - Left hip straight x 4 hours - advance diet - restart blood thinners tomorrow - [id number] daily x 3 months - anticipate DC home in 4 hours when goals met - Do not submerge for 7 days - Routine care  - follow up with Dr. WagEarleen Newport Dr. McCLaurence Ferrari 4-8 weeks with new non-invasive exam. [facility]  Signed,  JaiDulcy FannyagEarleen NewportO
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
## 2019-06-21 NOTE — Sedation Documentation (Signed)
Due to patient being in afib, pulse oximeter erroneous at times and unable to read at times. MD aware.

## 2019-06-21 NOTE — Discharge Instructions (Addendum)
Angiogram, Care After This sheet gives you information about how to care for yourself after your procedure. Your health care provider may also give you more specific instructions. If you have problems or questions, contact your health care provider. What can I expect after the procedure? After the procedure, it is common to have bruising and tenderness at the catheter insertion area. Follow these instructions at home: Insertion site care  Follow instructions from your health care provider about how to take care of your insertion site. Make sure you: ? Wash your hands with soap and water before you change your bandage (dressing). If soap and water are not available, use hand sanitizer. ? Remove your dressing as told by your health care provider. In 24-48 hours ? Leave stitches (sutures), skin glue, or adhesive strips in place. These skin closures may need to stay in place for 2 weeks or longer. If adhesive strip edges start to loosen and curl up, you may trim the loose edges. Do not remove adhesive strips completely unless your health care provider tells you to do that.  Do not take baths, swim, or use a hot tub until your health care provider approves.  You may shower 24-48 hours after the procedure or as told by your health care provider. ? Gently wash the site with plain soap and water. ? Pat the area dry with a clean towel. ? Do not rub the site. This may cause bleeding.  Do not apply powder or lotion to the site. Keep the site clean and dry.  Check your insertion site every day for signs of infection. Check for: ? Redness, swelling, or pain. ? Fluid or blood. ? Warmth. ? Pus or a bad smell. Activity  Rest as told by your health care provider, usually for 1-2 days.  Do not lift anything that is heavier than 10 lbs. (4.5 kg) or as told by your health care provider.  Do not drive for 24 hours if you were given a medicine to help you relax (sedative).  Do not drive or use heavy  machinery while taking prescription pain medicine. General instructions   Return to your normal activities as told by your health care provider, usually in about a week. Ask your health care provider what activities are safe for you.  If the catheter site starts bleeding, lie flat and put pressure on the site. If the bleeding does not stop, get help right away. This is a medical emergency.  Drink enough fluid to keep your urine clear or pale yellow. This helps flush the contrast dye from your body.  Take over-the-counter and prescription medicines only as told by your health care provider.  Keep all follow-up visits as told by your health care provider. This is important. Contact a health care provider if:  You have a fever or chills.  You have redness, swelling, or pain around your insertion site.  You have fluid or blood coming from your insertion site.  The insertion site feels warm to the touch.  You have pus or a bad smell coming from your insertion site.  You have bruising around the insertion site.  You notice blood collecting in the tissue around the catheter site (hematoma). The hematoma may be painful to the touch. Get help right away if:  You have severe pain at the catheter insertion area.  The catheter insertion area swells very fast.  The catheter insertion area is bleeding, and the bleeding does not stop when you hold steady pressure  on the area.  The area near or just beyond the catheter insertion site becomes pale, cool, tingly, or numb. These symptoms may represent a serious problem that is an emergency. Do not wait to see if the symptoms will go away. Get medical help right away. Call your local emergency services (911 in the [place] Do not drive yourself to the hospital. Summary  After the procedure, it is common to have bruising and tenderness at the catheter insertion area.  After the procedure, it is important to rest and drink plenty of fluids.  Do  not take baths, swim, or use a hot tub until your health care provider says it is okay to do so. You may shower 24-48 hours after the procedure or as told by your health care provider.  If the catheter site starts bleeding, lie flat and put pressure on the site. If the bleeding does not stop, get help right away. This is a medical emergency. This information is not intended to replace advice given to you by your health care provider. Make sure you discuss any questions you have with your health care provider. Document Revised: 04/29/2017 Document Reviewed: 04/21/2016 Elsevier Patient Education  [place].  Moderate Conscious Sedation, Adult, Care After These instructions provide you with information about caring for yourself after your procedure. Your health care provider may also give you more specific instructions. Your treatment has been planned according to current medical practices, but problems sometimes occur. Call your health care provider if you have any problems or questions after your procedure. What can I expect after the procedure? After your procedure, it is common:  To feel sleepy for several hours.  To feel clumsy and have poor balance for several hours.  To have poor judgment for several hours.  To vomit if you eat too soon. Follow these instructions at home: For at least 24 hours after the procedure:   Do not: ? Participate in activities where you could fall or become injured. ? Drive. ? Use heavy machinery. ? Drink alcohol. ? Take sleeping pills or medicines that cause drowsiness. ? Make important decisions or sign legal documents. ? Take care of children on your own.  Rest. Eating and drinking  Follow the diet recommended by your health care provider.  If you vomit: ? Drink water, juice, or soup when you can drink without vomiting. ? Make sure you have little or no nausea before eating solid foods. General instructions  Have a responsible adult  stay with you until you are awake and alert.  Take over-the-counter and prescription medicines only as told by your health care provider.  If you smoke, do not smoke without supervision.  Keep all follow-up visits as told by your health care provider. This is important. Contact a health care provider if:  You keep feeling nauseous or you keep vomiting.  You feel light-headed.  You develop a rash.  You have a fever. Get help right away if:  You have trouble breathing. This information is not intended to replace advice given to you by your health care provider. Make sure you discuss any questions you have with your health care provider. Document Revised: 04/29/2017 Document Reviewed: 09/06/2015 Elsevier Patient Education  2020 [employer].

## 2019-06-25 ENCOUNTER — Encounter: Payer: Self-pay | Admitting: *Deleted

## 2019-06-26 ENCOUNTER — Encounter (HOSPITAL_COMMUNITY): Payer: Self-pay

## 2019-07-04 DIAGNOSIS — E1151 Type 2 diabetes mellitus with diabetic peripheral angiopathy without gangrene: Secondary | ICD-10-CM | POA: Insufficient documentation

## 2019-08-01 ENCOUNTER — Encounter: Payer: Self-pay | Admitting: *Deleted

## 2019-08-28 ENCOUNTER — Telehealth (HOSPITAL_COMMUNITY): Payer: Self-pay | Admitting: Radiology

## 2019-08-28 ENCOUNTER — Other Ambulatory Visit (HOSPITAL_COMMUNITY): Payer: Self-pay | Admitting: Interventional Radiology

## 2019-08-28 DIAGNOSIS — I70229 Atherosclerosis of native arteries of extremities with rest pain, unspecified extremity: Secondary | ICD-10-CM

## 2019-08-28 DIAGNOSIS — I998 Other disorder of circulatory system: Secondary | ICD-10-CM

## 2019-08-28 NOTE — Telephone Encounter (Signed)
Called pt's daughter to schedule leg angio with Dr. Earleen Newport, left VM for her to call back to set up. JM

## 2019-08-31 ENCOUNTER — Ambulatory Visit (HOSPITAL_COMMUNITY): Payer: Medicare Other

## 2019-09-04 ENCOUNTER — Other Ambulatory Visit: Payer: Self-pay | Admitting: Student

## 2019-09-05 ENCOUNTER — Ambulatory Visit (HOSPITAL_COMMUNITY)
Admission: RE | Admit: 2019-09-05 | Discharge: 2019-09-05 | Disposition: A | Discharge status: Home / Self Care | Payer: Medicare Other | Source: Ambulatory Visit | Attending: Interventional Radiology | Admitting: Interventional Radiology

## 2019-09-05 ENCOUNTER — Encounter (HOSPITAL_COMMUNITY): Payer: Self-pay

## 2019-09-05 ENCOUNTER — Other Ambulatory Visit (HOSPITAL_COMMUNITY): Payer: Self-pay | Admitting: Interventional Radiology

## 2019-09-05 ENCOUNTER — Other Ambulatory Visit: Payer: Self-pay

## 2019-09-05 DIAGNOSIS — I509 Heart failure, unspecified: Secondary | ICD-10-CM | POA: Insufficient documentation

## 2019-09-05 DIAGNOSIS — Z8249 Family history of ischemic heart disease and other diseases of the circulatory system: Secondary | ICD-10-CM | POA: Diagnosis not present

## 2019-09-05 DIAGNOSIS — Z794 Long term (current) use of insulin: Secondary | ICD-10-CM | POA: Insufficient documentation

## 2019-09-05 DIAGNOSIS — I4821 Permanent atrial fibrillation: Secondary | ICD-10-CM | POA: Diagnosis not present

## 2019-09-05 DIAGNOSIS — Z7901 Long term (current) use of anticoagulants: Secondary | ICD-10-CM | POA: Diagnosis not present

## 2019-09-05 DIAGNOSIS — Z79899 Other long term (current) drug therapy: Secondary | ICD-10-CM | POA: Diagnosis not present

## 2019-09-05 DIAGNOSIS — I70233 Atherosclerosis of native arteries of right leg with ulceration of ankle: Secondary | ICD-10-CM | POA: Diagnosis not present

## 2019-09-05 DIAGNOSIS — I110 Hypertensive heart disease with heart failure: Secondary | ICD-10-CM | POA: Insufficient documentation

## 2019-09-05 DIAGNOSIS — L97319 Non-pressure chronic ulcer of right ankle with unspecified severity: Secondary | ICD-10-CM | POA: Insufficient documentation

## 2019-09-05 DIAGNOSIS — E1151 Type 2 diabetes mellitus with diabetic peripheral angiopathy without gangrene: Secondary | ICD-10-CM | POA: Insufficient documentation

## 2019-09-05 DIAGNOSIS — Z96652 Presence of left artificial knee joint: Secondary | ICD-10-CM | POA: Diagnosis not present

## 2019-09-05 DIAGNOSIS — I998 Other disorder of circulatory system: Secondary | ICD-10-CM

## 2019-09-05 DIAGNOSIS — E11621 Type 2 diabetes mellitus with foot ulcer: Secondary | ICD-10-CM | POA: Insufficient documentation

## 2019-09-05 DIAGNOSIS — M1990 Unspecified osteoarthritis, unspecified site: Secondary | ICD-10-CM | POA: Diagnosis not present

## 2019-09-05 DIAGNOSIS — Z885 Allergy status to narcotic agent status: Secondary | ICD-10-CM | POA: Insufficient documentation

## 2019-09-05 DIAGNOSIS — Z87891 Personal history of nicotine dependence: Secondary | ICD-10-CM | POA: Diagnosis not present

## 2019-09-05 DIAGNOSIS — I70229 Atherosclerosis of native arteries of extremities with rest pain, unspecified extremity: Secondary | ICD-10-CM

## 2019-09-05 DIAGNOSIS — E785 Hyperlipidemia, unspecified: Secondary | ICD-10-CM | POA: Diagnosis not present

## 2019-09-05 HISTORY — PX: IR ANGIOGRAM EXTREMITY RIGHT: IMG652

## 2019-09-05 HISTORY — PX: IR US GUIDE VASC ACCESS RIGHT: IMG2390

## 2019-09-05 HISTORY — PX: IR TIB-PERO ART ATHEREC INC PTA MOD SED: IMG2314

## 2019-09-05 LAB — CBC
HCT: 39.2 % (ref 39.0–52.0)
Hemoglobin: 12.8 g/dL — ABNORMAL LOW (ref 13.0–17.0)
MCH: 31.1 pg (ref 26.0–34.0)
MCHC: 32.7 g/dL (ref 30.0–36.0)
MCV: 95.4 fL (ref 80.0–100.0)
Platelets: 175 10*3/uL (ref 150–400)
RBC: 4.11 MIL/uL — ABNORMAL LOW (ref 4.22–5.81)
RDW: 14.2 % (ref 11.5–15.5)
WBC: 8.7 10*3/uL (ref 4.0–10.5)
nRBC: 0 % (ref 0.0–0.2)

## 2019-09-05 LAB — BASIC METABOLIC PANEL
Anion gap: 11 (ref 5–15)
BUN: 36 mg/dL — ABNORMAL HIGH (ref 8–23)
CO2: 24 mmol/L (ref 22–32)
Calcium: 9.1 mg/dL (ref 8.9–10.3)
Chloride: 105 mmol/L (ref 98–111)
Creatinine, Ser: 1.7 mg/dL — ABNORMAL HIGH (ref 0.61–1.24)
GFR calc Af Amer: 42 mL/min — ABNORMAL LOW (ref >60)
GFR calc non Af Amer: 36 mL/min — ABNORMAL LOW (ref >60)
Glucose, Bld: 125 mg/dL — ABNORMAL HIGH (ref 70–99)
Potassium: 3.3 mmol/L — ABNORMAL LOW (ref 3.5–5.1)
Sodium: 140 mmol/L (ref 135–145)

## 2019-09-05 LAB — PROTIME-INR
INR: 1.5 — ABNORMAL HIGH (ref 0.8–1.2)
Prothrombin Time: 17.6 seconds — ABNORMAL HIGH (ref 11.4–15.2)

## 2019-09-05 LAB — GLUCOSE, CAPILLARY: Glucose-Capillary: 113 mg/dL — ABNORMAL HIGH (ref 70–99)

## 2019-09-05 LAB — APTT: aPTT: 41 seconds — ABNORMAL HIGH (ref 24–36)

## 2019-09-05 MED ORDER — IODIXANOL 320 MG/ML IV SOLN
100.0000 mL | Freq: Once | INTRAVENOUS | Status: AC | PRN
  Administered 2019-09-05: 50 mL

## 2019-09-05 MED ORDER — IODIXANOL 320 MG/ML IV SOLN
100.0000 mL | Freq: Once | INTRAVENOUS | Status: AC | PRN
  Administered 2019-09-05: 35 mL via INTRA_ARTERIAL

## 2019-09-05 MED ORDER — MIDAZOLAM HCL 2 MG/2ML IJ SOLN
INTRAMUSCULAR | Status: AC
  Filled 2019-09-05: qty 2

## 2019-09-05 MED ORDER — PROTAMINE SULFATE 10 MG/ML IV SOLN
INTRAVENOUS | Status: AC | PRN
  Administered 2019-09-05: 50 mg via INTRAVENOUS

## 2019-09-05 MED ORDER — PROTAMINE SULFATE 10 MG/ML IV SOLN
50.0000 mg | Freq: Once | INTRAVENOUS | Status: DC
  Filled 2019-09-05: qty 5

## 2019-09-05 MED ORDER — CLOPIDOGREL BISULFATE 300 MG PO TABS
ORAL_TABLET | ORAL | Status: AC
  Administered 2019-09-05: 300 mg via ORAL
  Filled 2019-09-05: qty 1

## 2019-09-05 MED ORDER — ASPIRIN EC 325 MG PO TBEC
DELAYED_RELEASE_TABLET | ORAL | Status: AC
  Filled 2019-09-05: qty 2

## 2019-09-05 MED ORDER — LIDOCAINE HCL 1 % IJ SOLN
INTRAMUSCULAR | Status: AC
  Filled 2019-09-05: qty 20

## 2019-09-05 MED ORDER — MIDAZOLAM HCL 2 MG/2ML IJ SOLN
INTRAMUSCULAR | Status: AC | PRN
  Administered 2019-09-05: 1 mg via INTRAVENOUS

## 2019-09-05 MED ORDER — FENTANYL CITRATE (PF) 100 MCG/2ML IJ SOLN
INTRAMUSCULAR | Status: AC | PRN
  Administered 2019-09-05 (×2): 25 ug via INTRAVENOUS

## 2019-09-05 MED ORDER — FENTANYL CITRATE (PF) 100 MCG/2ML IJ SOLN
INTRAMUSCULAR | Status: AC
  Filled 2019-09-05: qty 2

## 2019-09-05 MED ORDER — CLOPIDOGREL BISULFATE 75 MG PO TABS: 300.0000 mg | ORAL_TABLET | Freq: Every day | ORAL | Status: DC

## 2019-09-05 MED ORDER — ASPIRIN 325 MG PO TABS
650.0000 mg | ORAL_TABLET | Freq: Every day | ORAL | Status: DC
  Administered 2019-09-05: 650 mg via ORAL

## 2019-09-05 MED ORDER — HEPARIN SODIUM (PORCINE) 1000 UNIT/ML IJ SOLN
INTRAMUSCULAR | Status: AC
  Filled 2019-09-05: qty 1

## 2019-09-05 MED ORDER — SODIUM CHLORIDE 0.9 % IV SOLN: INTRAVENOUS | Status: DC

## 2019-09-05 MED ORDER — HEPARIN SODIUM (PORCINE) 1000 UNIT/ML IJ SOLN
INTRAMUSCULAR | Status: AC | PRN
  Administered 2019-09-05: 9000 [IU] via INTRAVENOUS

## 2019-09-05 MED ORDER — SODIUM CHLORIDE 0.9 % IV SOLN: INTRAVENOUS | Status: AC

## 2019-09-05 NOTE — Discharge Instructions (Signed)
Angiogram, Care After This sheet gives you information about how to care for yourself after your procedure. Your doctor may also give you more specific instructions. If you have problems or questions, contact your doctor. Follow these instructions at home: Insertion site care  Follow instructions from your doctor about how to take care of your long, thin tube (catheter) insertion area. Make sure you: ? Wash your hands with soap and water before you change your bandage (dressing). If you cannot use soap and water, use hand sanitizer. ? Remove your bandage as told by your doctor. In 24 hours  Do not take baths, swim, or use a hot tub until your doctor says it is okay.  You may shower 24-48 hours after the procedure or as told by your doctor. ? Gently wash the area with plain soap and water. ? Pat the area dry with a clean towel. ? Do not rub the area. This may cause bleeding.  Do not apply powder or lotion to the area. Keep the area clean and dry.  Check your insertion area every day for signs of infection. Check for: ? More redness, swelling, or pain. ? Fluid or blood. ? Warmth. ? Pus or a bad smell. Activity  Rest as told by your doctor, usually for 1-2 days.  Do not lift anything that is heavier than 10 lbs. (4.5 kg) or as told by your doctor. For 5 days  Do not drive for 24 hours if you were given a medicine to help you relax (sedative).  Do not drive or use heavy machinery while taking prescription pain medicine. General instructions   Go back to your normal activities as told by your doctor, usually in about a week. Ask your doctor what activities are safe for you.  If the insertion area starts to bleed, lie flat and put pressure on the area. If the bleeding does not stop, get help right away. This is an emergency.  Drink enough fluid to keep your pee (urine) clear or pale yellow.  Take over-the-counter and prescription medicines only as told by your doctor.  Keep all  follow-up visits as told by your doctor. This is important. Contact a doctor if:  You have a fever.  You have chills.  You have more redness, swelling, or pain around your insertion area.  You have fluid or blood coming from your insertion area.  The insertion area feels warm to the touch.  You have pus or a bad smell coming from your insertion area.  You have more bruising around the insertion area.  Blood collects in the tissue around the insertion area (hematoma) that may be painful to the touch. Get help right away if:  You have a lot of pain in the insertion area.  The insertion area swells very fast.  The insertion area is bleeding, and the bleeding does not stop after holding steady pressure on the area.  The area near or just beyond the insertion area becomes pale, cool, tingly, or numb. These symptoms may be an emergency. Do not wait to see if the symptoms will go away. Get medical help right away. Call your local emergency services (911 in the [place] Do not drive yourself to the hospital. Summary  After the procedure, it is common to have bruising and tenderness at the long, thin tube insertion area.  After the procedure, it is important to rest and drink plenty of fluids.  Do not take baths, swim, or use a hot tub until  your doctor says it is okay to do so. You may shower 24-48 hours after the procedure or as told by your doctor.  If the insertion area starts to bleed, lie flat and put pressure on the area. If the bleeding does not stop, get help right away. This is an emergency. This information is not intended to replace advice given to you by your health care provider. Make sure you discuss any questions you have with your health care provider. Document Revised: 04/29/2017 Document Reviewed: 05/11/2016 Elsevier Patient Education  2020 [employer].

## 2019-09-05 NOTE — Procedures (Addendum)
Interventional Radiology Procedure Note  Procedure:   US guided access right CFA antegrade.  Angiogram RLE. Treatment of occluded right PT artery with directional atherectomy and balloon angioplasty to 42mm.  Closure with angioseal  Findings: No femoral popliteal disease Occluded PT after the origin.  Diseased peroneal artery in the mid/distal segment Patent AT  Treatment of complete occlusion of the PT artery, with 0% residual occlusion, and 100% improvement in perfusion of the PT territory.   Complications: None  Recommendations:  - Ok to shower tomorrow - 4 hrs right leg straight - Routine local wound care - NV checks - gentle IV hydration - continue single antiplatelet medication and may restart coumadin tomorrow - Follow up with Dr. Earleen Newport in [place] in 3-4 weeks - Continue wound care      Signed,  Dulcy Fanny. Earleen Newport, DO
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
## 2019-09-07 ENCOUNTER — Other Ambulatory Visit (HOSPITAL_COMMUNITY): Payer: Self-pay | Admitting: Interventional Radiology

## 2019-09-07 ENCOUNTER — Encounter (HOSPITAL_COMMUNITY): Payer: Self-pay

## 2019-12-31 ENCOUNTER — Ambulatory Visit: Payer: Medicare Other | Admitting: Podiatry

## 2020-01-02 ENCOUNTER — Other Ambulatory Visit: Payer: Self-pay

## 2020-01-02 ENCOUNTER — Ambulatory Visit: Payer: Medicare Other | Admitting: Sports Medicine

## 2020-01-02 ENCOUNTER — Other Ambulatory Visit: Payer: Self-pay | Admitting: Sports Medicine

## 2020-01-02 ENCOUNTER — Encounter: Payer: Self-pay | Admitting: Sports Medicine

## 2020-01-02 DIAGNOSIS — F0391 Unspecified dementia with behavioral disturbance: Secondary | ICD-10-CM

## 2020-01-02 DIAGNOSIS — L97311 Non-pressure chronic ulcer of right ankle limited to breakdown of skin: Secondary | ICD-10-CM | POA: Diagnosis not present

## 2020-01-02 DIAGNOSIS — L97511 Non-pressure chronic ulcer of other part of right foot limited to breakdown of skin: Secondary | ICD-10-CM | POA: Diagnosis not present

## 2020-01-02 DIAGNOSIS — L97521 Non-pressure chronic ulcer of other part of left foot limited to breakdown of skin: Secondary | ICD-10-CM | POA: Diagnosis not present

## 2020-01-02 DIAGNOSIS — L97421 Non-pressure chronic ulcer of left heel and midfoot limited to breakdown of skin: Secondary | ICD-10-CM | POA: Diagnosis not present

## 2020-01-02 DIAGNOSIS — E1151 Type 2 diabetes mellitus with diabetic peripheral angiopathy without gangrene: Secondary | ICD-10-CM

## 2020-01-02 DIAGNOSIS — F03918 Unspecified dementia, unspecified severity, with other behavioral disturbance: Secondary | ICD-10-CM

## 2020-01-02 DIAGNOSIS — I739 Peripheral vascular disease, unspecified: Secondary | ICD-10-CM

## 2020-01-02 NOTE — Progress Notes (Signed)
Subjective: Dennis Williams is a 86 y.o. male patient seen in office for evaluation of ulcerations bilateral. Has home nursing [facility] helping to change the dressings, use to go to wound center and had a circulation procedure that helped but still have swelling. Reports that he pulled off his right 5th toenail with sock. At left big toe had a ingrown removed by Dr. Gretta Arab but still hasnt healed since 4 months ago. Patient has no other pedal complaints at this time.  Review of systems noncontributory   Patient Active Problem List   Diagnosis Date Noted  . Diabetes mellitus with peripheral angiopathy ([place]) 07/04/2019  . Chronic ulcer of great toe of left foot with fat layer exposed ([place]) 06/13/2019  . Moderate episode of recurrent major depressive disorder ([place]) 05/21/2019  . Onychomycosis due to dermatophyte 04/19/2019  . Aftercare following surgery 04/11/2019  . Paronychia of toe, left 04/04/2019  . H/O: CVA (cerebrovascular accident) 02/12/2019  . Senile dementia without behavioral disturbance ([place])   . Senile dementia with behavioral disturbance ([place])   . Chronic diastolic congestive heart failure ([place])   . Type 2 diabetes mellitus with peripheral neuropathy (HCC)   . Ischemic cerebrovascular accident (CVA) of frontal lobe ([place]) 10/24/2018  . Stroke-like symptoms 10/23/2018  . CKD (chronic kidney disease), stage III 10/23/2018  . Hypokalemia 10/23/2018  . CVA (cerebral vascular accident) ([place]) 10/23/2018  . 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 ([place]) 10/17/2018  . Current severe episode of major depressive disorder without psychotic features ([place]) 09/19/2018  . Insomnia due to anxiety and fear 09/12/2018  . Situational anxiety 09/12/2018  . Morbid obesity ([place]) 07/11/2018  . Current use of long term anticoagulation 07/27/2017  . Medicare annual wellness visit, subsequent 07/27/2017  . Chronic pain of right knee 07/26/2017  . Insulin dependent diabetes mellitus 11/21/2016  . Cellulitis  11/21/2016  . Acute respiratory failure with hypoxia ([place]) 11/21/2016  . Sleep apnea 11/21/2016  . Constipation 11/21/2016  . Renal insufficiency   . 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 Bilateral primary osteoarthritis of knee 04/08/2016  . Dyslipidemia 06/05/2015  . Cardiomyopathy ([place]) 06/05/2015  . Prostate cancer ([place]) 05/11/2011  . Claudication ([place]) 08/31/2010  . Atrial fibrillation ([place])   . Hypertension   . Chronic diastolic CHF (congestive heart failure) ([place])    Current Outpatient Medications on File Prior to Visit  Medication Sig Dispense Refill  . acetaminophen (TYLENOL) 325 MG tablet Take 2 tablets (650 mg total) by mouth every 4 (four) hours as needed for mild pain (or temp > 37.5 C (99.5 F)).    Marland Kitchen atorvastatin (LIPITOR) 80 MG tablet Take 1 tablet (80 mg total) by mouth daily at 6 PM. (Patient not taking: Reported on 08/31/2019) 30 tablet 0  . carvedilol (COREG) 12.5 MG tablet Take 12.5 mg by mouth 2 (two) times daily with a meal.     . escitalopram (LEXAPRO) 10 MG tablet Take 10 mg by mouth at bedtime.    . furosemide (LASIX) 20 MG tablet Take 3 tablets (60 mg total) by mouth daily. (Patient taking differently: Take 80 mg by mouth daily. ) 30 tablet 1  . Insulin Glargine (LANTUS) 100 UNIT/ML Solostar Pen Inject 30 Units into the skin at bedtime. (Patient taking differently: Inject 30 Units into the skin at bedtime as needed (high blood sugar). ) 15 mL 11  . mirtazapine (REMERON) 7.5 MG tablet Take 1 tablet (7.5 mg total) by mouth at bedtime. (Patient not taking: Reported on 08/31/2019) 30 tablet  0  . Multiple Vitamins-Minerals (MULTIVITAMIN WITH MINERALS) tablet Take 1 tablet by mouth daily.    . potassium chloride SA (K-DUR) 20 MEQ tablet Take 1 tablet (20 mEq total) by mouth 2 (two) times daily. 60 tablet 0  . tobramycin (TOBREX) 0.3 % ophthalmic ointment Place 1 application into both eyes every 4 (four) hours. EVERY 4 HOURS FOR 10 DAYS ONLY FOR DRY EYES    . warfarin  (COUMADIN) 5 MG tablet Take 1 tablet (5 mg total) by mouth daily at 6 PM. (Patient taking differently: Take 4-5 mg by mouth daily at 6 PM. Pt alternates 4 mg and 5 mg every other day) 30 tablet 0   No current facility-administered medications on file prior to visit.   Allergies  Allergen Reactions  . Dilaudid [Hydromorphone Hcl] Nausea And Vomiting and Other (See Comments)    Felt hot    No results found for this or any previous visit (from the past 2160 hour(s)).  Objective: There were no vitals filed for this visit.  General: Patient is awake, alert, oriented x 2 and in no acute distress.  Dermatology: Skin is warm and dry bilateral with a Parital thickness ulceration present  1. Right lateral ankle 2x1cm with granular base 2. Right 5th toenail bed granular 3. Left 1st toe 1x1cm with fibrotic base 4. Left posterior heel 0.5x0.8cm granular base  There is no malodor, no active drainage, no erythema, diffuse edema. No other acute signs of infection.  Nails were elongated and thick consistent with onychomycosis.   Vascular: Dorsalis Pedis pulse =1 /4 Bilateral,  Posterior Tibial pulse = 0/4 Bilateral,  Capillary Fill Time < 5 seconds, trophic skin changes bilateral.  Neurologic: Protective sensation absent bilateral.  Musculosketal: No pain to ulcerated areas. +bunion and pes planus.   No results for input(s): GRAMSTAIN, LABORGA in the last 8760 hours.  Assessment and Plan:  Problem List Items Addressed This Visit      Cardiovascular and Mediastinum   Diabetes mellitus with peripheral angiopathy ([place])     Nervous and Auditory   Senile dementia with behavioral disturbance ([place])    Other Visit Diagnoses    Ulcer of right ankle, limited to breakdown of skin ([place])    -  Primary   Relevant Orders   WOUND CULTURE   WOUND CULTURE   Skin ulcer of left heel, limited to breakdown of skin ([place])       Relevant Orders   WOUND CULTURE   WOUND CULTURE   Skin ulcer of toe of right  foot, limited to breakdown of skin (HCC)       Toe ulcer, left, limited to breakdown of skin (HCC)       PVD (peripheral vascular disease) ([place])           -Examined patient and discussed the progression of the wound and treatment alternatives. -Patient declined xrays this visit  - Excisionally dedbrided ulcerations bilateral to healthy bleeding borders removing nonviable tissue using a sterile chisel blade. Wound measures post debridement as above. Wound was debrided to the level of the dermis with viable wound base exposed to promote healing. Hemostasis was achieved with manuel pressure. Patient tolerated procedure well without any discomfort or anesthesia necessary for this wound debridement.  -Cultured the right ankle and left heel will call if antibiotics are needed -Applied betadine to right 5th toe and left 1st toe and applied PRISMA right lateral ankle and left heel and dry sterile dressing and instructed patient to continue with  daily dressings at home consisting of same with help from home nurse weekly; gave wife rest of prisma for the nurse to use -Recommend foot miracle to legs and surgitube for edema control - Advised patient to go to the ER or return to office if the wound worsens or if constitutional symptoms are present. -At no charge debrided nails x 10 using sterile nail nipper without incident -Patient to return to office in 10 days for follow up care and evaluation or sooner if problems arise.  Landis Martins, DPM

## 2020-01-04 ENCOUNTER — Telehealth: Payer: Self-pay

## 2020-01-04 NOTE — Telephone Encounter (Signed)
Called pt and LVM stating his wound cx is negative and does not need any Abx at this time

## 2020-01-04 NOTE — Telephone Encounter (Signed)
-----   Message from Landis Martins, [place] sent at 01/04/2020  7:55 AM EDT ----- Will you let patient know that his wound culture is negative. Shows heavy growth of normal skin bacteria. HE DOES NOT NEED antibiotics by mouth at this time. -Dr. Chauncey Cruel

## 2020-01-07 LAB — WOUND CULTURE

## 2020-01-15 ENCOUNTER — Other Ambulatory Visit: Payer: Self-pay

## 2020-01-15 ENCOUNTER — Encounter: Payer: Self-pay | Admitting: Sports Medicine

## 2020-01-15 ENCOUNTER — Ambulatory Visit: Payer: Medicare Other | Admitting: Sports Medicine

## 2020-01-15 DIAGNOSIS — L97311 Non-pressure chronic ulcer of right ankle limited to breakdown of skin: Secondary | ICD-10-CM | POA: Diagnosis not present

## 2020-01-15 DIAGNOSIS — L97421 Non-pressure chronic ulcer of left heel and midfoot limited to breakdown of skin: Secondary | ICD-10-CM | POA: Diagnosis not present

## 2020-01-15 DIAGNOSIS — L97521 Non-pressure chronic ulcer of other part of left foot limited to breakdown of skin: Secondary | ICD-10-CM | POA: Diagnosis not present

## 2020-01-15 DIAGNOSIS — E1151 Type 2 diabetes mellitus with diabetic peripheral angiopathy without gangrene: Secondary | ICD-10-CM

## 2020-01-15 DIAGNOSIS — L97511 Non-pressure chronic ulcer of other part of right foot limited to breakdown of skin: Secondary | ICD-10-CM | POA: Diagnosis not present

## 2020-01-15 DIAGNOSIS — I739 Peripheral vascular disease, unspecified: Secondary | ICD-10-CM

## 2020-01-15 DIAGNOSIS — R600 Localized edema: Secondary | ICD-10-CM

## 2020-01-15 NOTE — Progress Notes (Signed)
Subjective: Dennis Williams is a 86 y.o. male patient seen in office for follow-up evaluation of ulcerations bilateral. Has home nursing [facility] helping to change the dressings, twice weekly reports that it seems like it is healing but the same drainage and some new areas to the lateral right leg. Patient has no other pedal complaints at this time.  Patient Active Problem List   Diagnosis Date Noted   Diabetes mellitus with peripheral angiopathy ([place]) 07/04/2019   Chronic ulcer of great toe of left foot with fat layer exposed ([place]) 06/13/2019   Moderate episode of recurrent major depressive disorder ([place]) 05/21/2019   Onychomycosis due to dermatophyte 04/19/2019   Aftercare following surgery 04/11/2019   Paronychia of toe, left 04/04/2019   H/O: CVA (cerebrovascular accident) 02/12/2019   Senile dementia without behavioral disturbance ([place])    Senile dementia with behavioral disturbance ([place])    Chronic diastolic congestive heart failure ([place])    Type 2 diabetes mellitus with peripheral neuropathy ([place])    Ischemic cerebrovascular accident (CVA) of frontal lobe ([place]) 10/24/2018   Stroke-like symptoms 10/23/2018   CKD (chronic kidney disease), stage III 10/23/2018   Hypokalemia 10/23/2018   CVA (cerebral vascular accident) ([place]) 10/23/2018   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 ([place]) 10/17/2018   Current severe episode of major depressive disorder without psychotic features ([place]) 09/19/2018   Insomnia due to anxiety and fear 09/12/2018   Situational anxiety 09/12/2018   Morbid obesity ([place]) 07/11/2018   Current use of long term anticoagulation 07/27/2017   Medicare annual wellness visit, subsequent 07/27/2017   Chronic pain of right knee 07/26/2017   Insulin dependent diabetes mellitus 11/21/2016   Cellulitis 11/21/2016   Acute respiratory failure with hypoxia ([place]) 11/21/2016   Sleep apnea 11/21/2016   Constipation 11/21/2016   Renal insufficiency     S/P total knee replacement 11/15/2016   Bilateral primary osteoarthritis of knee 04/08/2016   Dyslipidemia 06/05/2015   Cardiomyopathy ([place]) 06/05/2015   Prostate cancer ([place]) 05/11/2011   Claudication ([place]) 08/31/2010   Atrial fibrillation (HCC)    Hypertension    Chronic diastolic CHF (congestive heart failure) ([place])    Current Outpatient Medications on File Prior to Visit  Medication Sig Dispense Refill   acetaminophen (TYLENOL) 325 MG tablet Take 2 tablets (650 mg total) by mouth every 4 (four) hours as needed for mild pain (or temp > 37.5 C (99.5 F)).     atorvastatin (LIPITOR) 80 MG tablet Take 1 tablet (80 mg total) by mouth daily at 6 PM. (Patient not taking: Reported on 08/31/2019) 30 tablet 0   carvedilol (COREG) 12.5 MG tablet Take 12.5 mg by mouth 2 (two) times daily with a meal.      escitalopram (LEXAPRO) 10 MG tablet Take 10 mg by mouth at bedtime.     furosemide (LASIX) 20 MG tablet Take 3 tablets (60 mg total) by mouth daily. (Patient taking differently: Take 80 mg by mouth daily. ) 30 tablet 1   Insulin Glargine (LANTUS) 100 UNIT/ML Solostar Pen Inject 30 Units into the skin at bedtime. (Patient taking differently: Inject 30 Units into the skin at bedtime as needed (high blood sugar). ) 15 mL 11   mirtazapine (REMERON) 7.5 MG tablet Take 1 tablet (7.5 mg total) by mouth at bedtime. (Patient not taking: Reported on 08/31/2019) 30 tablet 0   Multiple Vitamins-Minerals (MULTIVITAMIN WITH MINERALS) tablet Take 1 tablet by mouth daily.     potassium chloride SA (K-DUR) 20 MEQ tablet Take 1 tablet (  20 mEq total) by mouth 2 (two) times daily. 60 tablet 0   tobramycin (TOBREX) 0.3 % ophthalmic ointment Place 1 application into both eyes every 4 (four) hours. EVERY 4 HOURS FOR 10 DAYS ONLY FOR DRY EYES     warfarin (COUMADIN) 5 MG tablet Take 1 tablet (5 mg total) by mouth daily at 6 PM. (Patient taking differently: Take 4-5 mg by mouth daily at 6 PM. Pt alternates 4 mg  and 5 mg every other day) 30 tablet 0   No current facility-administered medications on file prior to visit.   Allergies  Allergen Reactions   Dilaudid [Hydromorphone Hcl] Nausea And Vomiting and Other (See Comments)    Felt hot    Recent Results (from the past 2160 hour(s))  WOUND CULTURE     Status: Abnormal   Collection Time: 01/02/20  4:43 PM   Specimen: Foot, Right; Wound   Wound Culture and sens  Result Value Ref Range   Gram Stain Result Final report    Organism ID, Bacteria Comment     Comment: Few white blood cells.   Organism ID, Bacteria Comment     Comment: Many gram positive cocci.   Organism ID, Bacteria Comment     Comment: Few gram negative rods.   Aerobic Bacterial Culture Final report (A)    Organism ID, Bacteria Enterobacter asburiae (A)     Comment: Heavy growth   Organism ID, Bacteria Comment (A)     Comment: Enterobacter cloacae complex Scant growth    Organism ID, Bacteria Mixed skin flora     Comment: Moderate growth   Antimicrobial Susceptibility Comment     Comment:       ** S = Susceptible; I = Intermediate; R = Resistant **                    P = Positive; N = Negative             MICS are expressed in micrograms per mL    Antibiotic                 RSLT#1    RSLT#2    RSLT#3    RSLT#4 Amoxicillin/Clavulanic Acid    R         R Cefazolin                      R         R Cefepime                       S         S Ceftriaxone                    S Cefuroxime                     R         R Ciprofloxacin                  S         S Gentamicin                     S         S Imipenem                       S  S Levofloxacin                             S Meropenem                      S         S Tetracycline                   S         S Tobramycin                     S         S Trimethoprim/Sulfa             S         S   WOUND CULTURE     Status: Abnormal   Collection Time: 01/02/20  4:44 PM   Specimen: Foot, Left; Wound   Wound  Culture and sens  Result Value Ref Range   Gram Stain Result Final report (A)    Organism ID, Bacteria Comment     Comment: No white blood cells seen.   Organism ID, Bacteria Comment     Comment: Rare gram positive cocci   Organism ID, Bacteria Comment (A)     Comment: Rare gram negative rods.   Aerobic Bacterial Culture Final report (A)    Organism ID, Bacteria Enterobacter asburiae (A)     Comment: Heavy growth   Organism ID, Bacteria Comment (A)     Comment: Enterobacter cloacae complex Scant growth    Organism ID, Bacteria Mixed skin flora     Comment: Moderate growth   Antimicrobial Susceptibility Comment     Comment:       ** S = Susceptible; I = Intermediate; R = Resistant **                    P = Positive; N = Negative             MICS are expressed in micrograms per mL    Antibiotic                 RSLT#1    RSLT#2    RSLT#3    RSLT#4 Amoxicillin/Clavulanic Acid    R         R Cefazolin                      R         R Cefepime                       S         S Ceftriaxone                    S Cefuroxime                     R         R Ciprofloxacin                  S         S Gentamicin                     S         S Imipenem  S         S Levofloxacin                             S Meropenem                      S         S Tetracycline                   S         S Tobramycin                     S         S Trimethoprim/Sulfa             S         S     Objective: There were no vitals filed for this visit.  General: Patient is awake, alert, oriented x 2 and in no acute distress.  Dermatology: Skin is warm and dry bilateral with a Parital thickness ulceration present  1. Right lateral ankle 2x 0.8 cm with granular base 2. Right 5th toenail bed granular now a dry scab healing well 3. Left 1st toe 1x 0.8 cm with 30:70 fibrogranular base 4. Left posterior heel 0.5x0.6cm granular base  There is no malodor, no active drainage, no erythema,  diffuse edema. No other acute signs of infection.  Nails were elongated and thick consistent with onychomycosis.   Vascular: Dorsalis Pedis pulse =1 /4 Bilateral,  Posterior Tibial pulse = 0/4 Bilateral,  Capillary Fill Time < 5 seconds, trophic skin changes bilateral.  Neurologic: Protective sensation absent bilateral.  Musculosketal: No pain to ulcerated areas. +bunion and pes planus.   No results for input(s): GRAMSTAIN, LABORGA in the last 8760 hours.  Assessment and Plan:  Problem List Items Addressed This Visit      Cardiovascular and Mediastinum   Diabetes mellitus with peripheral angiopathy ([place])    Other Visit Diagnoses    Ulcer of right ankle, limited to breakdown of skin ([place])    -  Primary   Skin ulcer of left heel, limited to breakdown of skin (HCC)       Skin ulcer of toe of right foot, limited to breakdown of skin (HCC)       Toe ulcer, left, limited to breakdown of skin (HCC)       PVD (peripheral vascular disease) ([place])       Edema of both lower extremities         -Re-Examined patient and discussed the progression of the wound and treatment alternatives.  -Nonselectively dedbrided ulcerations bilateral to healthy bleeding borders removing nonviable tissue using a saline moistened gauze.  Wound measures post debridement as above. Wound was debrided to the level of the dermis with viable wound base exposed to promote healing. Hemostasis was achieved with manuel pressure. Patient tolerated procedure well without any discomfort or anesthesia necessary for this wound debridement.  -Applied Prisma and Coban dressing to the knee bilateral and advised patient to allow home nursing to change weekly - Advised patient to go to the ER or return to office if the wound worsens or if constitutional symptoms are present. -Patient to return to office in 2 weeks for follow up care and evaluation or sooner if problems arise.  If wounds fail to continue to improve may consider  reevaluation of vascular status/ABIs  Landis Martins, DPM

## 2020-01-17 ENCOUNTER — Telehealth: Payer: Self-pay

## 2020-01-17 NOTE — Telephone Encounter (Signed)
Contacted [facility] and gave updated wound care orders for pt, from Dr. Cannon Kettle

## 2020-01-30 ENCOUNTER — Ambulatory Visit: Payer: Medicare Other | Admitting: Sports Medicine

## 2020-02-05 ENCOUNTER — Telehealth: Payer: Self-pay | Admitting: Podiatrist

## 2020-02-05 NOTE — Telephone Encounter (Signed)
Thank you,I will re-assess him tomorrow and update the nurse with the appropriate orders!  -Dr. Cannon Kettle

## 2020-02-05 NOTE — Telephone Encounter (Signed)
[name] from [facility] home [facility] called this am and left a vm stating she saw Mr.  Dennis Williams on Friday. His left heel looks better and right leg the same but.Marland Kitchen  He is refusing coban, refusing elevation-  She tried tubigrip for edema and said he is wearing it some of the time.   She was concerned about the left great toe being red and wanted to be sure you looked at it.    He has an appointment with you tomorrow 9/8.  If any changes to orders fax to [phone]  If you would like to speak with [name] her personal number is [phone]  Thanks!

## 2020-02-06 ENCOUNTER — Other Ambulatory Visit: Payer: Self-pay

## 2020-02-06 ENCOUNTER — Ambulatory Visit: Payer: Medicare Other | Admitting: Sports Medicine

## 2020-02-06 ENCOUNTER — Encounter: Payer: Self-pay | Admitting: Sports Medicine

## 2020-02-06 DIAGNOSIS — I739 Peripheral vascular disease, unspecified: Secondary | ICD-10-CM

## 2020-02-06 DIAGNOSIS — F03918 Unspecified dementia, unspecified severity, with other behavioral disturbance: Secondary | ICD-10-CM

## 2020-02-06 DIAGNOSIS — L97311 Non-pressure chronic ulcer of right ankle limited to breakdown of skin: Secondary | ICD-10-CM | POA: Diagnosis not present

## 2020-02-06 DIAGNOSIS — L97511 Non-pressure chronic ulcer of other part of right foot limited to breakdown of skin: Secondary | ICD-10-CM

## 2020-02-06 DIAGNOSIS — L97421 Non-pressure chronic ulcer of left heel and midfoot limited to breakdown of skin: Secondary | ICD-10-CM

## 2020-02-06 DIAGNOSIS — E1151 Type 2 diabetes mellitus with diabetic peripheral angiopathy without gangrene: Secondary | ICD-10-CM

## 2020-02-06 DIAGNOSIS — F0391 Unspecified dementia with behavioral disturbance: Secondary | ICD-10-CM

## 2020-02-06 DIAGNOSIS — R600 Localized edema: Secondary | ICD-10-CM

## 2020-02-06 NOTE — Progress Notes (Signed)
Subjective: Dennis Williams is a 86 y.o. male patient seen in office for follow-up evaluation of ulcerations bilateral. Has home nursing [facility] helping to change the dressings, twice weekly reports that it seems like it is healing at the heels but there is increased maceration and drainage including blood from the left great toe.  Patient is assisted by wife who helps to report this history. Patient has no other pedal complaints at this time.  Patient Active Problem List   Diagnosis Date Noted  . Diabetes mellitus with peripheral angiopathy ([place]) 07/04/2019  . Chronic ulcer of great toe of left foot with fat layer exposed ([place]) 06/13/2019  . Moderate episode of recurrent major depressive disorder ([place]) 05/21/2019  . Onychomycosis due to dermatophyte 04/19/2019  . Aftercare following surgery 04/11/2019  . Paronychia of toe, left 04/04/2019  . H/O: CVA (cerebrovascular accident) 02/12/2019  . Senile dementia without behavioral disturbance ([place])   . Senile dementia with behavioral disturbance ([place])   . Chronic diastolic congestive heart failure ([place])   . Type 2 diabetes mellitus with peripheral neuropathy (HCC)   . Ischemic cerebrovascular accident (CVA) of frontal lobe ([place]) 10/24/2018  . Stroke-like symptoms 10/23/2018  . CKD (chronic kidney disease), stage III 10/23/2018  . Hypokalemia 10/23/2018  . CVA (cerebral vascular accident) ([place]) 10/23/2018  . 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 Current severe episode of major depressive disorder without psychotic features ([place]) 09/19/2018  . Insomnia due to anxiety and fear 09/12/2018  . Situational anxiety 09/12/2018  . Morbid obesity ([place]) 07/11/2018  . Current use of long term anticoagulation 07/27/2017  . Medicare annual wellness visit, subsequent 07/27/2017  . Chronic pain of right knee 07/26/2017  . Insulin dependent diabetes mellitus 11/21/2016  . Cellulitis 11/21/2016  . Acute respiratory failure with hypoxia ([place])  11/21/2016  . Sleep apnea 11/21/2016  . Constipation 11/21/2016  . Renal insufficiency   . 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 Bilateral primary osteoarthritis of knee 04/08/2016  . Dyslipidemia 06/05/2015  . Cardiomyopathy ([place]) 06/05/2015  . Prostate cancer ([place]) 05/11/2011  . Claudication ([place]) 08/31/2010  . Atrial fibrillation ([place])   . Hypertension   . Chronic diastolic CHF (congestive heart failure) ([place])    Current Outpatient Medications on File Prior to Visit  Medication Sig Dispense Refill  . acetaminophen (TYLENOL) 325 MG tablet Take 2 tablets (650 mg total) by mouth every 4 (four) hours as needed for mild pain (or temp > 37.5 C (99.5 F)).    Marland Kitchen atorvastatin (LIPITOR) 80 MG tablet Take 1 tablet (80 mg total) by mouth daily at 6 PM. (Patient not taking: Reported on 08/31/2019) 30 tablet 0  . carvedilol (COREG) 12.5 MG tablet Take 12.5 mg by mouth 2 (two) times daily with a meal.     . escitalopram (LEXAPRO) 10 MG tablet Take 10 mg by mouth at bedtime.    . furosemide (LASIX) 20 MG tablet Take 3 tablets (60 mg total) by mouth daily. (Patient taking differently: Take 80 mg by mouth daily. ) 30 tablet 1  . Insulin Glargine (LANTUS) 100 UNIT/ML Solostar Pen Inject 30 Units into the skin at bedtime. (Patient taking differently: Inject 30 Units into the skin at bedtime as needed (high blood sugar). ) 15 mL 11  . mirtazapine (REMERON) 7.5 MG tablet Take 1 tablet (7.5 mg total) by mouth at bedtime. (Patient not taking: Reported on 08/31/2019) 30 tablet 0  . Multiple Vitamins-Minerals (MULTIVITAMIN WITH MINERALS) tablet Take 1 tablet by  mouth daily.    . potassium chloride SA (K-DUR) 20 MEQ tablet Take 1 tablet (20 mEq total) by mouth 2 (two) times daily. 60 tablet 0  . tobramycin (TOBREX) 0.3 % ophthalmic ointment Place 1 application into both eyes every 4 (four) hours. EVERY 4 HOURS FOR 10 DAYS ONLY FOR DRY EYES    . warfarin (COUMADIN) 5 MG tablet Take 1 tablet (5 mg total) by mouth  daily at 6 PM. (Patient taking differently: Take 4-5 mg by mouth daily at 6 PM. Pt alternates 4 mg and 5 mg every other day) 30 tablet 0   No current facility-administered medications on file prior to visit.   Allergies  Allergen Reactions  . Dilaudid [Hydromorphone Hcl] Nausea And Vomiting and Other (See Comments)    Felt hot    Recent Results (from the past 2160 hour(s))  WOUND CULTURE     Status: Abnormal   Collection Time: 01/02/20  4:43 PM   Specimen: Foot, Right; Wound   Wound Culture and sens  Result Value Ref Range   Gram Stain Result Final report    Organism ID, Bacteria Comment     Comment: Few white blood cells.   Organism ID, Bacteria Comment     Comment: Many gram positive cocci.   Organism ID, Bacteria Comment     Comment: Few gram negative rods.   Aerobic Bacterial Culture Final report (A)    Organism ID, Bacteria Enterobacter asburiae (A)     Comment: Heavy growth   Organism ID, Bacteria Comment (A)     Comment: Enterobacter cloacae complex Scant growth    Organism ID, Bacteria Mixed skin flora     Comment: Moderate growth   Antimicrobial Susceptibility Comment     Comment:       ** S = Susceptible; I = Intermediate; R = Resistant **                    P = Positive; N = Negative             MICS are expressed in micrograms per mL    Antibiotic                 RSLT#1    RSLT#2    RSLT#3    RSLT#4 Amoxicillin/Clavulanic Acid    R         R Cefazolin                      R         R Cefepime                       S         S Ceftriaxone                    S Cefuroxime                     R         R Ciprofloxacin                  S         S Gentamicin                     S         S Imipenem  S         S Levofloxacin                             S Meropenem                      S         S Tetracycline                   S         S Tobramycin                     S         S Trimethoprim/Sulfa             S         S   WOUND CULTURE      Status: Abnormal   Collection Time: 01/02/20  4:44 PM   Specimen: Foot, Left; Wound   Wound Culture and sens  Result Value Ref Range   Gram Stain Result Final report (A)    Organism ID, Bacteria Comment     Comment: No white blood cells seen.   Organism ID, Bacteria Comment     Comment: Rare gram positive cocci   Organism ID, Bacteria Comment (A)     Comment: Rare gram negative rods.   Aerobic Bacterial Culture Final report (A)    Organism ID, Bacteria Enterobacter asburiae (A)     Comment: Heavy growth   Organism ID, Bacteria Comment (A)     Comment: Enterobacter cloacae complex Scant growth    Organism ID, Bacteria Mixed skin flora     Comment: Moderate growth   Antimicrobial Susceptibility Comment     Comment:       ** S = Susceptible; I = Intermediate; R = Resistant **                    P = Positive; N = Negative             MICS are expressed in micrograms per mL    Antibiotic                 RSLT#1    RSLT#2    RSLT#3    RSLT#4 Amoxicillin/Clavulanic Acid    R         R Cefazolin                      R         R Cefepime                       S         S Ceftriaxone                    S Cefuroxime                     R         R Ciprofloxacin                  S         S Gentamicin                     S         S Imipenem  S         S Levofloxacin                             S Meropenem                      S         S Tetracycline                   S         S Tobramycin                     S         S Trimethoprim/Sulfa             S         S     Objective: There were no vitals filed for this visit.  General: Patient is awake, alert, oriented x 2 and in no acute distress.  Dermatology: Skin is warm and dry bilateral with a Parital thickness ulceration present  1. Right lateral ankle 2x 0.5 cm with granular base 2. Right 5th toenail bed granular now a dry scab remains well-healed 3. Left 1st toe to x 1 cm with 30:70 fibrogranular base  and macerated margins appears to be larger than previous 4. Left posterior heel 0.5x0.5cm granular base  There is no malodor, no active drainage, no erythema, diffuse 1+ edema. No other acute signs of infection.  Nails are short and thick consistent with onychomycosis.   Vascular: Dorsalis Pedis pulse =1 /4 Bilateral,  Posterior Tibial pulse = 0/4 Bilateral,  Capillary Fill Time < 5 seconds, trophic skin changes bilateral.  Neurologic: Protective sensation absent bilateral.  Musculosketal: No pain to ulcerated areas. +bunion and pes planus.   No results for input(s): GRAMSTAIN, LABORGA in the last 8760 hours.  Assessment and Plan:  Problem List Items Addressed This Visit      Cardiovascular and Mediastinum   Diabetes mellitus with peripheral angiopathy ([place])     Nervous and Auditory   Senile dementia with behavioral disturbance ([place])    Other Visit Diagnoses    Ulcer of right ankle, limited to breakdown of skin ([place])    -  Primary   Skin ulcer of left heel, limited to breakdown of skin (HCC)       Skin ulcer of toe of right foot, limited to breakdown of skin (HCC)       PVD (peripheral vascular disease) ([place])       Edema of both lower extremities         -Re-Examined patient and discussed the progression of the wound and treatment alternatives.  -Nonselectively dedbrided ulcerations bilateral to healthy bleeding borders removing nonviable tissue using a saline moistened gauze.  Wound measures post debridement as above. Wound was debrided to the level of the dermis with viable wound base exposed to promote healing. Hemostasis was achieved with manuel pressure. Patient tolerated procedure well without any discomfort or anesthesia necessary for this wound debridement.  -Applied Prisma to the left heel and right lateral ankle ulcer and applied Iodosorb and Coban dressing to left great toe- Advised patient to go to the ER or return to office if the wound worsens or if constitutional  symptoms are present. -Encourage elevation and periodic calf exercises to help with edema -Patient to return to office in 2 weeks for follow up  care and evaluation or sooner if problems arise.  If wounds fail to continue to improve may consider reevaluation of vascular status/ABIs at next visit I also made wife well aware due to patient poor circulation may take a very long time to heal  Landis Martins, DPM

## 2020-02-20 ENCOUNTER — Ambulatory Visit: Payer: Medicare Other | Admitting: Sports Medicine

## 2020-02-20 ENCOUNTER — Other Ambulatory Visit: Payer: Self-pay

## 2020-02-20 ENCOUNTER — Encounter: Payer: Self-pay | Admitting: Sports Medicine

## 2020-02-20 DIAGNOSIS — L97421 Non-pressure chronic ulcer of left heel and midfoot limited to breakdown of skin: Secondary | ICD-10-CM | POA: Diagnosis not present

## 2020-02-20 DIAGNOSIS — L97511 Non-pressure chronic ulcer of other part of right foot limited to breakdown of skin: Secondary | ICD-10-CM

## 2020-02-20 DIAGNOSIS — E1151 Type 2 diabetes mellitus with diabetic peripheral angiopathy without gangrene: Secondary | ICD-10-CM

## 2020-02-20 DIAGNOSIS — R600 Localized edema: Secondary | ICD-10-CM

## 2020-02-20 DIAGNOSIS — I739 Peripheral vascular disease, unspecified: Secondary | ICD-10-CM

## 2020-02-20 DIAGNOSIS — L97311 Non-pressure chronic ulcer of right ankle limited to breakdown of skin: Secondary | ICD-10-CM | POA: Diagnosis not present

## 2020-02-20 NOTE — Progress Notes (Signed)
Subjective: Dennis Williams is a 86 y.o. male patient seen in office for follow-up evaluation of ulcerations bilateral. Has home nursing [facility] helping to change the dressings, twice weekly reports that it seems like wounds on the heel and leg is doing ok but not at the toe on the left.  Patient is assisted by wife who helps to report this history. Patient has no other pedal complaints at this time.  Patient Active Problem List   Diagnosis Date Noted   Diabetes mellitus with peripheral angiopathy ([place]) 07/04/2019   Chronic ulcer of great toe of left foot with fat layer exposed ([place]) 06/13/2019   Moderate episode of recurrent major depressive disorder ([place]) 05/21/2019   Onychomycosis due to dermatophyte 04/19/2019   Aftercare following surgery 04/11/2019   Paronychia of toe, left 04/04/2019   H/O: CVA (cerebrovascular accident) 02/12/2019   Senile dementia without behavioral disturbance ([place])    Senile dementia with behavioral disturbance ([place])    Chronic diastolic congestive heart failure ([place])    Type 2 diabetes mellitus with peripheral neuropathy ([place])    Ischemic cerebrovascular accident (CVA) of frontal lobe ([place]) 10/24/2018   Stroke-like symptoms 10/23/2018   CKD (chronic kidney disease), stage III 10/23/2018   Hypokalemia 10/23/2018   CVA (cerebral vascular accident) ([place]) 10/23/2018   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 ([place]) 10/17/2018   Current severe episode of major depressive disorder without psychotic features ([place]) 09/19/2018   Insomnia due to anxiety and fear 09/12/2018   Situational anxiety 09/12/2018   Morbid obesity ([place]) 07/11/2018   Current use of long term anticoagulation 07/27/2017   Medicare annual wellness visit, subsequent 07/27/2017   Chronic pain of right knee 07/26/2017   Insulin dependent diabetes mellitus 11/21/2016   Cellulitis 11/21/2016   Acute respiratory failure with hypoxia ([place]) 11/21/2016   Sleep apnea 11/21/2016    Constipation 11/21/2016   Renal insufficiency    S/P total knee replacement 11/15/2016   Bilateral primary osteoarthritis of knee 04/08/2016   Dyslipidemia 06/05/2015   Cardiomyopathy ([place]) 06/05/2015   Prostate cancer ([place]) 05/11/2011   Claudication ([place]) 08/31/2010   Atrial fibrillation (HCC)    Hypertension    Chronic diastolic CHF (congestive heart failure) ([place])    Current Outpatient Medications on File Prior to Visit  Medication Sig Dispense Refill   acetaminophen (TYLENOL) 325 MG tablet Take 2 tablets (650 mg total) by mouth every 4 (four) hours as needed for mild pain (or temp > 37.5 C (99.5 F)).     atorvastatin (LIPITOR) 80 MG tablet Take 1 tablet (80 mg total) by mouth daily at 6 PM. (Patient not taking: Reported on 08/31/2019) 30 tablet 0   carvedilol (COREG) 12.5 MG tablet Take 12.5 mg by mouth 2 (two) times daily with a meal.      escitalopram (LEXAPRO) 10 MG tablet Take 10 mg by mouth at bedtime.     furosemide (LASIX) 20 MG tablet Take 3 tablets (60 mg total) by mouth daily. (Patient taking differently: Take 80 mg by mouth daily. ) 30 tablet 1   Insulin Glargine (LANTUS) 100 UNIT/ML Solostar Pen Inject 30 Units into the skin at bedtime. (Patient taking differently: Inject 30 Units into the skin at bedtime as needed (high blood sugar). ) 15 mL 11   mirtazapine (REMERON) 7.5 MG tablet Take 1 tablet (7.5 mg total) by mouth at bedtime. (Patient not taking: Reported on 08/31/2019) 30 tablet 0   Multiple Vitamins-Minerals (MULTIVITAMIN WITH MINERALS) tablet Take 1 tablet by mouth daily.  potassium chloride SA (K-DUR) 20 MEQ tablet Take 1 tablet (20 mEq total) by mouth 2 (two) times daily. 60 tablet 0   tobramycin (TOBREX) 0.3 % ophthalmic ointment Place 1 application into both eyes every 4 (four) hours. EVERY 4 HOURS FOR 10 DAYS ONLY FOR DRY EYES     warfarin (COUMADIN) 5 MG tablet Take 1 tablet (5 mg total) by mouth daily at 6 PM. (Patient taking differently:  Take 4-5 mg by mouth daily at 6 PM. Pt alternates 4 mg and 5 mg every other day) 30 tablet 0   No current facility-administered medications on file prior to visit.   Allergies  Allergen Reactions   Dilaudid [Hydromorphone Hcl] Nausea And Vomiting and Other (See Comments)    Felt hot    Recent Results (from the past 2160 hour(s))  WOUND CULTURE     Status: Abnormal   Collection Time: 01/02/20  4:43 PM   Specimen: Foot, Right; Wound   Wound Culture and sens  Result Value Ref Range   Gram Stain Result Final report    Organism ID, Bacteria Comment     Comment: Few white blood cells.   Organism ID, Bacteria Comment     Comment: Many gram positive cocci.   Organism ID, Bacteria Comment     Comment: Few gram negative rods.   Aerobic Bacterial Culture Final report (A)    Organism ID, Bacteria Enterobacter asburiae (A)     Comment: Heavy growth   Organism ID, Bacteria Comment (A)     Comment: Enterobacter cloacae complex Scant growth    Organism ID, Bacteria Mixed skin flora     Comment: Moderate growth   Antimicrobial Susceptibility Comment     Comment:       ** S = Susceptible; I = Intermediate; R = Resistant **                    P = Positive; N = Negative             MICS are expressed in micrograms per mL    Antibiotic                 RSLT#1    RSLT#2    RSLT#3    RSLT#4 Amoxicillin/Clavulanic Acid    R         R Cefazolin                      R         R Cefepime                       S         S Ceftriaxone                    S Cefuroxime                     R         R Ciprofloxacin                  S         S Gentamicin                     S         S Imipenem  S         S Levofloxacin                             S Meropenem                      S         S Tetracycline                   S         S Tobramycin                     S         S Trimethoprim/Sulfa             S         S   WOUND CULTURE     Status: Abnormal   Collection Time:  01/02/20  4:44 PM   Specimen: Foot, Left; Wound   Wound Culture and sens  Result Value Ref Range   Gram Stain Result Final report (A)    Organism ID, Bacteria Comment     Comment: No white blood cells seen.   Organism ID, Bacteria Comment     Comment: Rare gram positive cocci   Organism ID, Bacteria Comment (A)     Comment: Rare gram negative rods.   Aerobic Bacterial Culture Final report (A)    Organism ID, Bacteria Enterobacter asburiae (A)     Comment: Heavy growth   Organism ID, Bacteria Comment (A)     Comment: Enterobacter cloacae complex Scant growth    Organism ID, Bacteria Mixed skin flora     Comment: Moderate growth   Antimicrobial Susceptibility Comment     Comment:       ** S = Susceptible; I = Intermediate; R = Resistant **                    P = Positive; N = Negative             MICS are expressed in micrograms per mL    Antibiotic                 RSLT#1    RSLT#2    RSLT#3    RSLT#4 Amoxicillin/Clavulanic Acid    R         R Cefazolin                      R         R Cefepime                       S         S Ceftriaxone                    S Cefuroxime                     R         R Ciprofloxacin                  S         S Gentamicin                     S         S Imipenem  S         S Levofloxacin                             S Meropenem                      S         S Tetracycline                   S         S Tobramycin                     S         S Trimethoprim/Sulfa             S         S     Objective: There were no vitals filed for this visit.  General: Patient is awake, alert, oriented x 2 and in no acute distress.  Dermatology: Skin is warm and dry bilateral with a Parital thickness ulceration present  1. Right lateral ankle 2x 0.4 cm with granular base 2. Right 5th toenail bed granular now a dry scab remains well-healed 3. Left 1st toe to x 1x1 cm with 30:70 fibronecrotic base and macerated margins appears to be  larger than previous 4. Left posterior heel 0.3x0.2cm granular base  There is no malodor, no active drainage, no erythema, diffuse 1+ edema. No other acute signs of infection.  Nails are short and thick consistent with onychomycosis.   Vascular: Dorsalis Pedis pulse =1 /4 Bilateral,  Posterior Tibial pulse = 0/4 Bilateral,  Capillary Fill Time < 5 seconds, trophic skin changes bilateral.  Neurologic: Protective sensation absent bilateral.  Musculosketal: No pain to ulcerated areas. +bunion and pes planus.   No results for input(s): GRAMSTAIN, LABORGA in the last 8760 hours.  Assessment and Plan:  Problem List Items Addressed This Visit      Cardiovascular and Mediastinum   Diabetes mellitus with peripheral angiopathy ([place])    Other Visit Diagnoses    Ulcer of right ankle, limited to breakdown of skin ([place])    -  Primary   Skin ulcer of left heel, limited to breakdown of skin (HCC)       Skin ulcer of toe of right foot, limited to breakdown of skin (HCC)       PVD (peripheral vascular disease) ([place])       Edema of both lower extremities       PAD (peripheral artery disease) ([place])          -Re-Examined patient and discussed the progression of the wound and treatment alternatives.  -Nonselectively dedbrided ulcerations bilateral to healthy bleeding borders removing nonviable tissue using a saline moistened gauze.  Wound measures post debridement as above. Wound was debrided to the level of the dermis with viable wound base exposed to promote healing. Hemostasis was achieved with manuel pressure. Patient tolerated procedure well without any discomfort or anesthesia necessary for this wound debridement.  -Applied Prisma to the left heel and right lateral ankle ulcer and applied Iodosorb and Coban dressing to left great toe - Advised patient to go to the ER or return to office if the wound worsens or if constitutional symptoms are present. -Encourage elevation and periodic calf exercises  to help with edema like before -Ordered new set of ABIs and recommend f/u with Dr. Jacqualyn Posey  IR Vascular  -Patient to return to office in 2 weeks for follow up care and evaluation or sooner if problems arise.   Landis Martins, DPM

## 2020-02-25 ENCOUNTER — Telehealth: Payer: Self-pay | Admitting: Sports Medicine

## 2020-02-25 NOTE — Telephone Encounter (Signed)
Pt is following up regarding a referral about getting checked for a blockage in his legs. She has not heard anything.

## 2020-02-25 NOTE — Telephone Encounter (Signed)
Will you let the patient's wife know that Dr. Earleen Newport from interventional radiology is aware. I talked to him last week. The vascular lab will be calling to get a repeat ABI test done on her husband and then Dr. Earleen Newport will read the results to let them know if there is any blockages. Tell wife to wait to later this week and if she hasn't heard anything about the ABI test to call the vascular lab at [facility] (because we faxed the order last week). The vascular lab phone # is: [phone]  Thanks Dr. Cannon Kettle

## 2020-02-27 NOTE — Telephone Encounter (Signed)
Wells Guiles talked to the patient's wife yesterday. Lattie Haw

## 2020-02-29 NOTE — Telephone Encounter (Signed)
Called and spoke with the patient's wife and relayed the message per Dr Cannon Kettle. Lattie Haw

## 2020-03-03 ENCOUNTER — Encounter: Payer: Self-pay | Admitting: Sports Medicine

## 2020-03-04 ENCOUNTER — Telehealth: Payer: Self-pay | Admitting: Sports Medicine

## 2020-03-04 NOTE — Telephone Encounter (Signed)
Patient needs to call [facility] to follow up with vascular doctor who did his ABI testing for further recommendation. She can call the hospital and ask to be connected to vascular lab and they will schedule his follow up appt with Dr. Earleen Newport. [phone]

## 2020-03-04 NOTE — Telephone Encounter (Signed)
Pt had tests done and I have received test results through the phone: "Worsening lower extremity peripheral artery disease". Please advise.

## 2020-03-05 ENCOUNTER — Encounter: Payer: Self-pay | Admitting: Sports Medicine

## 2020-03-05 ENCOUNTER — Other Ambulatory Visit: Payer: Self-pay

## 2020-03-05 ENCOUNTER — Ambulatory Visit: Payer: Medicare Other | Admitting: Sports Medicine

## 2020-03-05 DIAGNOSIS — E1151 Type 2 diabetes mellitus with diabetic peripheral angiopathy without gangrene: Secondary | ICD-10-CM | POA: Diagnosis not present

## 2020-03-05 DIAGNOSIS — I739 Peripheral vascular disease, unspecified: Secondary | ICD-10-CM

## 2020-03-05 DIAGNOSIS — L97421 Non-pressure chronic ulcer of left heel and midfoot limited to breakdown of skin: Secondary | ICD-10-CM

## 2020-03-05 DIAGNOSIS — L97521 Non-pressure chronic ulcer of other part of left foot limited to breakdown of skin: Secondary | ICD-10-CM

## 2020-03-05 DIAGNOSIS — L97311 Non-pressure chronic ulcer of right ankle limited to breakdown of skin: Secondary | ICD-10-CM | POA: Diagnosis not present

## 2020-03-05 MED ORDER — AMOXICILLIN-POT CLAVULANATE 875-125 MG PO TABS: 1.0000 | ORAL_TABLET | Freq: Two times a day (BID) | ORAL | 0 refills | Status: AC

## 2020-03-05 NOTE — Progress Notes (Signed)
Subjective: Dennis Williams is a 86 y.o. male patient seen in office for follow-up evaluation of ulcerations bilateral. Has home nursing [facility] helping to change the dressings, twice weekly reports that it seems like wounds seems to be getting worse and wife  reports that she has not heard anything more about the circulation test results since Monday. Patient has no other pedal complaints at this time.  Patient Active Problem List   Diagnosis Date Noted  . Diabetes mellitus with peripheral angiopathy ([place]) 07/04/2019  . Chronic ulcer of great toe of left foot with fat layer exposed ([place]) 06/13/2019  . Moderate episode of recurrent major depressive disorder ([place]) 05/21/2019  . Onychomycosis due to dermatophyte 04/19/2019  . Aftercare following surgery 04/11/2019  . Paronychia of toe, left 04/04/2019  . H/O: CVA (cerebrovascular accident) 02/12/2019  . Senile dementia without behavioral disturbance ([place])   . Senile dementia with behavioral disturbance ([place])   . Chronic diastolic congestive heart failure ([place])   . Type 2 diabetes mellitus with peripheral neuropathy (HCC)   . Ischemic cerebrovascular accident (CVA) of frontal lobe ([place]) 10/24/2018  . Stroke-like symptoms 10/23/2018  . CKD (chronic kidney disease), stage III ([place]) 10/23/2018  . Hypokalemia 10/23/2018  . CVA (cerebral vascular accident) ([place]) 10/23/2018  . 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 Current severe episode of major depressive disorder without psychotic features ([place]) 09/19/2018  . Insomnia due to anxiety and fear 09/12/2018  . Situational anxiety 09/12/2018  . Morbid obesity ([place]) 07/11/2018  . Current use of long term anticoagulation 07/27/2017  . Medicare annual wellness visit, subsequent 07/27/2017  . Chronic pain of right knee 07/26/2017  . Insulin dependent diabetes mellitus 11/21/2016  . Cellulitis 11/21/2016  . Acute respiratory failure with hypoxia ([place]) 11/21/2016  . Sleep apnea  11/21/2016  . Constipation 11/21/2016  . Renal insufficiency   . 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 Bilateral primary osteoarthritis of knee 04/08/2016  . Dyslipidemia 06/05/2015  . Cardiomyopathy ([place]) 06/05/2015  . Prostate cancer ([place]) 05/11/2011  . Claudication ([place]) 08/31/2010  . Atrial fibrillation ([place])   . Hypertension   . Chronic diastolic CHF (congestive heart failure) ([place])    Current Outpatient Medications on File Prior to Visit  Medication Sig Dispense Refill  . acetaminophen (TYLENOL) 325 MG tablet Take 2 tablets (650 mg total) by mouth every 4 (four) hours as needed for mild pain (or temp > 37.5 C (99.5 F)).    Marland Kitchen atorvastatin (LIPITOR) 80 MG tablet Take 1 tablet (80 mg total) by mouth daily at 6 PM. (Patient not taking: Reported on 08/31/2019) 30 tablet 0  . carvedilol (COREG) 12.5 MG tablet Take 12.5 mg by mouth 2 (two) times daily with a meal.     . escitalopram (LEXAPRO) 10 MG tablet Take 10 mg by mouth at bedtime.    . furosemide (LASIX) 20 MG tablet Take 3 tablets (60 mg total) by mouth daily. (Patient taking differently: Take 80 mg by mouth daily. ) 30 tablet 1  . Insulin Glargine (LANTUS) 100 UNIT/ML Solostar Pen Inject 30 Units into the skin at bedtime. (Patient taking differently: Inject 30 Units into the skin at bedtime as needed (high blood sugar). ) 15 mL 11  . mirtazapine (REMERON) 7.5 MG tablet Take 1 tablet (7.5 mg total) by mouth at bedtime. (Patient not taking: Reported on 08/31/2019) 30 tablet 0  . Multiple Vitamins-Minerals (MULTIVITAMIN WITH MINERALS) tablet Take 1 tablet by mouth daily.    . potassium  chloride SA (K-DUR) 20 MEQ tablet Take 1 tablet (20 mEq total) by mouth 2 (two) times daily. 60 tablet 0  . tobramycin (TOBREX) 0.3 % ophthalmic ointment Place 1 application into both eyes every 4 (four) hours. EVERY 4 HOURS FOR 10 DAYS ONLY FOR DRY EYES    . warfarin (COUMADIN) 5 MG tablet Take 1 tablet (5 mg total) by mouth daily at 6 PM. (Patient taking  differently: Take 4-5 mg by mouth daily at 6 PM. Pt alternates 4 mg and 5 mg every other day) 30 tablet 0   No current facility-administered medications on file prior to visit.   Allergies  Allergen Reactions  . Dilaudid [Hydromorphone Hcl] Nausea And Vomiting and Other (See Comments)    Felt hot    Recent Results (from the past 2160 hour(s))  WOUND CULTURE     Status: Abnormal   Collection Time: 01/02/20  4:43 PM   Specimen: Foot, Right; Wound   Wound Culture and sens  Result Value Ref Range   Gram Stain Result Final report    Organism ID, Bacteria Comment     Comment: Few white blood cells.   Organism ID, Bacteria Comment     Comment: Many gram positive cocci.   Organism ID, Bacteria Comment     Comment: Few gram negative rods.   Aerobic Bacterial Culture Final report (A)    Organism ID, Bacteria Enterobacter asburiae (A)     Comment: Heavy growth   Organism ID, Bacteria Comment (A)     Comment: Enterobacter cloacae complex Scant growth    Organism ID, Bacteria Mixed skin flora     Comment: Moderate growth   Antimicrobial Susceptibility Comment     Comment:       ** S = Susceptible; I = Intermediate; R = Resistant **                    P = Positive; N = Negative             MICS are expressed in micrograms per mL    Antibiotic                 RSLT#1    RSLT#2    RSLT#3    RSLT#4 Amoxicillin/Clavulanic Acid    R         R Cefazolin                      R         R Cefepime                       S         S Ceftriaxone                    S Cefuroxime                     R         R Ciprofloxacin                  S         S Gentamicin                     S         S Imipenem  S         S Levofloxacin                             S Meropenem                      S         S Tetracycline                   S         S Tobramycin                     S         S Trimethoprim/Sulfa             S         S   WOUND CULTURE     Status: Abnormal    Collection Time: 01/02/20  4:44 PM   Specimen: Foot, Left; Wound   Wound Culture and sens  Result Value Ref Range   Gram Stain Result Final report (A)    Organism ID, Bacteria Comment     Comment: No white blood cells seen.   Organism ID, Bacteria Comment     Comment: Rare gram positive cocci   Organism ID, Bacteria Comment (A)     Comment: Rare gram negative rods.   Aerobic Bacterial Culture Final report (A)    Organism ID, Bacteria Enterobacter asburiae (A)     Comment: Heavy growth   Organism ID, Bacteria Comment (A)     Comment: Enterobacter cloacae complex Scant growth    Organism ID, Bacteria Mixed skin flora     Comment: Moderate growth   Antimicrobial Susceptibility Comment     Comment:       ** S = Susceptible; I = Intermediate; R = Resistant **                    P = Positive; N = Negative             MICS are expressed in micrograms per mL    Antibiotic                 RSLT#1    RSLT#2    RSLT#3    RSLT#4 Amoxicillin/Clavulanic Acid    R         R Cefazolin                      R         R Cefepime                       S         S Ceftriaxone                    S Cefuroxime                     R         R Ciprofloxacin                  S         S Gentamicin                     S         S Imipenem  S         S Levofloxacin                             S Meropenem                      S         S Tetracycline                   S         S Tobramycin                     S         S Trimethoprim/Sulfa             S         S     Objective: There were no vitals filed for this visit.  General: Patient is awake, alert, oriented x 2 and in no acute distress.  Dermatology: Skin is warm and dry bilateral with a full thickness ulceration present  1. Right lateral ankle 1.8x 3 cm with fibrogranular base, larger than previous 2.  Left 1st toe plantar tuft x 4x2cm with 30:70 fibronecrotic base and macerated margins appears to be larger than previous  with mild odor 4. Left posterior hee1x0.5cm granular base, larger than previous, no active drainage, no erythema, diffuse 1+ edema. No other acute signs of infection.  Nails are short and thick consistent with onychomycosis.   Vascular: Dorsalis Pedis pulse =1 /4 Bilateral,  Posterior Tibial pulse = 0/4 Bilateral,  Capillary Fill Time < 5 seconds, trophic skin changes bilateral.  Neurologic: Protective sensation absent bilateral.  Musculosketal: Minimal pain to ulcerated areas. +bunion and pes planus.   No results for input(s): GRAMSTAIN, LABORGA in the last 8760 hours.  Assessment and Plan:  Problem List Items Addressed This Visit      Cardiovascular and Mediastinum   Diabetes mellitus with peripheral angiopathy ([place])    Other Visit Diagnoses    Ulcer of right ankle, limited to breakdown of skin ([place])    -  Primary   Skin ulcer of left heel, limited to breakdown of skin ([place])       Toe ulcer, left, limited to breakdown of skin ([place])       PAD (peripheral artery disease) ([place])          -Re-Examined patient and discussed the progression of the wound and treatment alternatives.  -Nonselectively dedbrided ulcerations bilateral to healthy bleeding borders removing nonviable tissue using a saline moistened gauze.  Wound measures post debridement as above. Wound was debrided to the level of the dermis with viable wound base exposed to promote healing. Hemostasis was achieved with manuel pressure. Patient tolerated procedure well without any discomfort or anesthesia necessary for this wound debridement.  -Applied betadine to the left heel and 1st toe and medihoney to right lateral ankle ulcer covered with dry dressing and advised wife to do the same at home daily  -Rx Augmetin since there was mild odor from the left 1st toe -Recommend foot miracle cream to legs twice daily  - Advised patient to go to the ER or return to office if the wound worsens or if constitutional symptoms are  present. -Discussed case with Dr. Jacqualyn Posey IR Vascular who will see patient for re-evaluation for oculsions  -Patient to return to office in 2 weeks for follow  up care and evaluation or sooner if problems arise.   Landis Martins, DPM

## 2020-03-07 ENCOUNTER — Ambulatory Visit: Payer: Medicare Other | Admitting: Sports Medicine

## 2020-03-19 ENCOUNTER — Ambulatory Visit: Payer: Medicare Other | Admitting: Sports Medicine

## 2020-03-31 DEATH — deceased
